# Patient Record
Sex: Male | Born: 1960 | Race: White | Hispanic: No | Marital: Single | Smoking: Current every day smoker
Health system: Southern US, Community
[De-identification: ages and names within clinical notes are randomized; demographics above are authoritative.]

## PROBLEM LIST (undated history)

## (undated) DIAGNOSIS — K859 Acute pancreatitis without necrosis or infection, unspecified: Secondary | ICD-10-CM

## (undated) DIAGNOSIS — C49A4 Gastrointestinal stromal tumor of large intestine: Secondary | ICD-10-CM

## (undated) DIAGNOSIS — Z0000 Encounter for general adult medical examination without abnormal findings: Secondary | ICD-10-CM

## (undated) DIAGNOSIS — F1010 Alcohol abuse, uncomplicated: Secondary | ICD-10-CM

## (undated) DIAGNOSIS — R911 Solitary pulmonary nodule: Secondary | ICD-10-CM

## (undated) DIAGNOSIS — D649 Anemia, unspecified: Secondary | ICD-10-CM

## (undated) DIAGNOSIS — Z720 Tobacco use: Secondary | ICD-10-CM

## (undated) DIAGNOSIS — K439 Ventral hernia without obstruction or gangrene: Secondary | ICD-10-CM

## (undated) DIAGNOSIS — F419 Anxiety disorder, unspecified: Secondary | ICD-10-CM

## (undated) DIAGNOSIS — K219 Gastro-esophageal reflux disease without esophagitis: Secondary | ICD-10-CM

## (undated) DIAGNOSIS — C49A0 Gastrointestinal stromal tumor, unspecified site: Secondary | ICD-10-CM

## (undated) DIAGNOSIS — Z8659 Personal history of other mental and behavioral disorders: Secondary | ICD-10-CM

## (undated) HISTORY — DX: Gastrointestinal stromal tumor of large intestine: C49.A4

## (undated) HISTORY — DX: Alcohol abuse, uncomplicated: F10.10

## (undated) HISTORY — DX: Solitary pulmonary nodule: R91.1

## (undated) HISTORY — DX: Ventral hernia without obstruction or gangrene: K43.9

## (undated) HISTORY — PX: PARTIAL COLECTOMY: SHX5273

## (undated) HISTORY — DX: Acute pancreatitis without necrosis or infection, unspecified: K85.90

## (undated) HISTORY — DX: Tobacco use: Z72.0

## (undated) HISTORY — PX: SMALL INTESTINE SURGERY: SHX150

## (undated) HISTORY — PX: HERNIA REPAIR: SHX51

## (undated) HISTORY — DX: Gastro-esophageal reflux disease without esophagitis: K21.9

## (undated) HISTORY — DX: Encounter for general adult medical examination without abnormal findings: Z00.00

## (undated) HISTORY — DX: Anemia, unspecified: D64.9

## (undated) HISTORY — DX: Gastrointestinal stromal tumor, unspecified site: C49.A0

## (undated) HISTORY — DX: Anxiety disorder, unspecified: F41.9

## (undated) HISTORY — DX: Personal history of other mental and behavioral disorders: Z86.59

---

## 2001-05-26 ENCOUNTER — Emergency Department (HOSPITAL_COMMUNITY): Admission: EM | Admit: 2001-05-26 | Discharge: 2001-05-27 | Payer: Self-pay | Admitting: *Deleted

## 2001-07-08 ENCOUNTER — Encounter: Payer: Self-pay | Admitting: Emergency Medicine

## 2001-07-08 ENCOUNTER — Encounter: Payer: Self-pay | Admitting: Neurosurgery

## 2001-07-08 ENCOUNTER — Inpatient Hospital Stay (HOSPITAL_COMMUNITY): Admission: AC | Admit: 2001-07-08 | Discharge: 2001-07-08 | Payer: Self-pay

## 2007-11-23 ENCOUNTER — Inpatient Hospital Stay (HOSPITAL_COMMUNITY): Admission: EM | Admit: 2007-11-23 | Discharge: 2007-12-05 | Payer: Self-pay | Admitting: Emergency Medicine

## 2007-11-24 ENCOUNTER — Ambulatory Visit: Payer: Self-pay | Admitting: Internal Medicine

## 2007-11-26 ENCOUNTER — Ambulatory Visit: Payer: Self-pay | Admitting: Gastroenterology

## 2007-11-30 ENCOUNTER — Ambulatory Visit: Payer: Self-pay | Admitting: Gastroenterology

## 2007-12-02 ENCOUNTER — Encounter (INDEPENDENT_AMBULATORY_CARE_PROVIDER_SITE_OTHER): Payer: Self-pay | Admitting: General Surgery

## 2008-10-19 ENCOUNTER — Emergency Department (HOSPITAL_COMMUNITY): Admission: EM | Admit: 2008-10-19 | Discharge: 2008-10-20 | Payer: Self-pay | Admitting: Emergency Medicine

## 2010-03-04 ENCOUNTER — Emergency Department (HOSPITAL_COMMUNITY)
Admission: EM | Admit: 2010-03-04 | Discharge: 2010-03-04 | Payer: Self-pay | Source: Home / Self Care | Admitting: Emergency Medicine

## 2010-03-07 LAB — URINALYSIS, ROUTINE W REFLEX MICROSCOPIC
Nitrite: NEGATIVE
Protein, ur: NEGATIVE mg/dL
Specific Gravity, Urine: <1.005 — ABNORMAL LOW (ref 1.005–1.030)
Urine Glucose, Fasting: NEGATIVE mg/dL
Urobilinogen, UA: 0.2 mg/dL (ref 0.0–1.0)
pH: 5.5 (ref 5.0–8.0)

## 2010-03-07 LAB — DIFFERENTIAL
Basophils Relative: 0 % (ref 0–1)
Eosinophils Relative: 1 % (ref 0–5)
Lymphocytes Relative: 30 % (ref 12–46)
Lymphs Abs: 2.5 10*3/uL (ref 0.7–4.0)
Monocytes Absolute: 0.5 10*3/uL (ref 0.1–1.0)
Monocytes Relative: 5 % (ref 3–12)
Neutrophils Relative %: 64 % (ref 43–77)

## 2010-03-07 LAB — CBC
HCT: 44.2 % (ref 39.0–52.0)
Hemoglobin: 16 g/dL (ref 13.0–17.0)
MCH: 34.4 pg — ABNORMAL HIGH (ref 26.0–34.0)
MCHC: 36.2 g/dL — ABNORMAL HIGH (ref 30.0–36.0)
RBC: 4.65 MIL/uL (ref 4.22–5.81)
WBC: 8.5 10*3/uL (ref 4.0–10.5)

## 2010-03-07 LAB — RAPID URINE DRUG SCREEN, HOSP PERFORMED
Opiates: NOT DETECTED
Tetrahydrocannabinol: NOT DETECTED

## 2010-03-07 LAB — BASIC METABOLIC PANEL
BUN: 5 mg/dL — ABNORMAL LOW (ref 6–23)
Chloride: 104 mEq/L (ref 96–112)
Creatinine, Ser: 0.98 mg/dL (ref 0.4–1.5)
Glucose, Bld: 86 mg/dL (ref 70–99)
Potassium: 3.7 mEq/L (ref 3.5–5.1)

## 2010-04-10 ENCOUNTER — Emergency Department (HOSPITAL_COMMUNITY)
Admission: EM | Admit: 2010-04-10 | Discharge: 2010-04-10 | Discharge status: Against Medical Advice | Payer: Self-pay | Attending: Emergency Medicine | Admitting: Emergency Medicine

## 2010-04-10 ENCOUNTER — Emergency Department (HOSPITAL_COMMUNITY): Payer: Self-pay

## 2010-04-10 DIAGNOSIS — M25569 Pain in unspecified knee: Secondary | ICD-10-CM | POA: Insufficient documentation

## 2010-04-10 DIAGNOSIS — F10229 Alcohol dependence with intoxication, unspecified: Secondary | ICD-10-CM | POA: Insufficient documentation

## 2010-04-10 LAB — CBC
HCT: 45.7 % (ref 39.0–52.0)
Hemoglobin: 15.6 g/dL (ref 13.0–17.0)
MCHC: 34.1 g/dL (ref 30.0–36.0)
WBC: 10.8 10*3/uL — ABNORMAL HIGH (ref 4.0–10.5)

## 2010-04-10 LAB — DIFFERENTIAL
Eosinophils Absolute: 0.1 10*3/uL (ref 0.0–0.7)
Eosinophils Relative: 1 % (ref 0–5)
Lymphs Abs: 3.2 10*3/uL (ref 0.7–4.0)
Monocytes Relative: 6 % (ref 3–12)
Neutro Abs: 6.7 10*3/uL (ref 1.7–7.7)
Neutrophils Relative %: 63 % (ref 43–77)

## 2010-04-10 LAB — COMPREHENSIVE METABOLIC PANEL
ALT: 15 U/L (ref 0–53)
AST: 28 U/L (ref 0–37)
Albumin: 3.7 g/dL (ref 3.5–5.2)
BUN: 5 mg/dL — ABNORMAL LOW (ref 6–23)
Chloride: 101 mEq/L (ref 96–112)
GFR calc non Af Amer: >60 mL/min (ref >60)
Glucose, Bld: 95 mg/dL (ref 70–99)
Total Bilirubin: 0.4 mg/dL (ref 0.3–1.2)
Total Protein: 7.7 g/dL (ref 6.0–8.3)

## 2010-04-10 LAB — GLUCOSE, CAPILLARY

## 2010-04-10 LAB — RAPID URINE DRUG SCREEN, HOSP PERFORMED
Amphetamines: NOT DETECTED
Barbiturates: NOT DETECTED
Cocaine: NOT DETECTED
Opiates: NOT DETECTED
Tetrahydrocannabinol: NOT DETECTED

## 2010-05-19 LAB — CBC
MCHC: 35.4 g/dL (ref 30.0–36.0)
MCV: 97 fL (ref 78.0–100.0)
Platelets: 95 10*3/uL — ABNORMAL LOW (ref 150–400)
RDW: 13.9 % (ref 11.5–15.5)
WBC: 5.4 10*3/uL (ref 4.0–10.5)

## 2010-05-19 LAB — COMPREHENSIVE METABOLIC PANEL
ALT: 44 U/L (ref 0–53)
AST: 79 U/L — ABNORMAL HIGH (ref 0–37)
Albumin: 3.9 g/dL (ref 3.5–5.2)
Alkaline Phosphatase: 68 U/L (ref 39–117)
BUN: 5 mg/dL — ABNORMAL LOW (ref 6–23)
Chloride: 104 mEq/L (ref 96–112)
Glucose, Bld: 96 mg/dL (ref 70–99)
Potassium: 3.9 mEq/L (ref 3.5–5.1)
Sodium: 141 mEq/L (ref 135–145)
Total Protein: 7.5 g/dL (ref 6.0–8.3)

## 2010-05-19 LAB — ETHANOL
Alcohol, Ethyl (B): 299 mg/dL — ABNORMAL HIGH (ref 0–10)
Alcohol, Ethyl (B): 97 mg/dL — ABNORMAL HIGH (ref 0–10)

## 2010-05-19 LAB — DIFFERENTIAL
Basophils Relative: 1 % (ref 0–1)
Monocytes Absolute: 0.5 10*3/uL (ref 0.1–1.0)
Monocytes Relative: 9 % (ref 3–12)
Neutro Abs: 2.5 10*3/uL (ref 1.7–7.7)

## 2010-05-19 LAB — RAPID URINE DRUG SCREEN, HOSP PERFORMED
Amphetamines: NOT DETECTED
Barbiturates: NOT DETECTED
Opiates: NOT DETECTED
Tetrahydrocannabinol: NOT DETECTED

## 2010-06-27 NOTE — Op Note (Signed)
[name], DESHLER                ACCOUNT NO.:  [id number]   MEDICAL RECORD NO.:  [id number]          PATIENT TYPE:  INP   LOCATION:  IC09                          FACILITY:  [facility]   PHYSICIAN:  Dalia Heading, M.D.  DATE OF BIRTH:  1960-04-29   DATE OF PROCEDURE:  12/02/2007  DATE OF DISCHARGE:                               OPERATIVE REPORT   PREOPERATIVE DIAGNOSIS:  Sigmoid colon diverticulitis with abscess.   POSTOPERATIVE DIAGNOSIS:  Sigmoid colon diverticulitis with abscess.   PROCEDURE:  Sigmoid colectomy.   SURGEON:  Dalia Heading, MD   ANESTHESIA:  General endotracheal.   INDICATIONS:  The patient is a 50 year old white male who presents with  an enlarging abscess in the sigmoid colon mesentery.  There is no free  fluid noted in the region.  It is suspected that the patient has had  diverticulitis with perforation as contained, though neoplasm has not  been ruled out.  As the abscess is enlarging, the patient now comes to  the operating room for a partial colectomy.  The risks and benefits of  the procedure including bleeding, infection, cardiopulmonary  difficulties, and the possibility of a colostomy were fully explained to  the patient, gave informed consent.   PROCEDURE NOTE:  The patient was placed in the low lithotomy position  after induction of general endotracheal anesthesia.  The abdomen and  perineum were prepped and draped using the usual sterile technique with  Betadine.  Surgical site confirmation was performed.   A lower midline incision was made from the umbilicus to the suprapubic  region.  The peritoneal cavity was entered into without difficulty.  The  small bowel was then displaced anteriorly.  The nasogastric tube was  noted to be in a appropriate position in the stomach.  The left colon  was mobilized along its peritoneal reflection.  This was done around the  sigmoid colon region down to the colorectal juncture.  The left ureter  was  identified and kept from the operative field.  The patient had a  small abscess cavity present, which contained only a few mL of purulent  fluid.  This was removed without difficulty.  Cultures could not be  attempted as the abscess cavity was so small in nature.  A GIA stapler  was placed across the colorectal juncture as well as the proximal  sigmoid colon.  The mesentery was divided using the LigaSure.  A single  suture was placed distally on the sigmoid colon for orientation purposes  and the specimen was sent to Pathology for further examination.  A side-  to-side anastomosis was then performed between the proximal sigmoid  colon and the colorectal juncture.  This was done using a GIA-75  stapler.  The colotomy was closed using a TA-60 stapler.  The staple  line was bolstered using 3-0 silk sutures.  Surrounding adipose tissue  was then used over the anastomotic line.  The pelvis was then copiously  irrigated with gentamicin and normal saline.  All surgical personnel  then changed their gloves.   The bowel was returned to the  abdominal cavity in orderly fashion.  The  fascia was reapproximated using a looped 0-PDS suture.  Subcutaneous  layer was irrigated with normal saline and the skin was closed using  staples.  Betadine ointment and dry sterile dressings were applied.   The case was a dirty case as a small abscess was found.  All tape and  needle counts were correct at the end of the procedure.  The patient was  extubated in the operating room and went back to recovery room awake and  stable condition.   COMPLICATIONS:  None.   SPECIMEN:  Sigmoid colon.  Blood loss less than 100 mL.   DRAINS:  Jackson-Pratt drain to left pelvic region.      Dalia Heading, M.D.  Electronically Signed     MAJ/MEDQ  D:  12/02/2007  T:  12/02/2007  Job:  [id number]   cc:   Kassie Mends, M.D.  [address]  [place] , [place] [address]

## 2010-06-27 NOTE — Group Therapy Note (Signed)
NAMEALEC, [name]                ACCOUNT NO.:  [id number]   MEDICAL RECORD NO.:  [id number]          PATIENT TYPE:  INP   LOCATION:  A304                          FACILITY:  [facility]   PHYSICIAN:  Dorris Singh, DO    DATE OF BIRTH:  1960-08-01   DATE OF PROCEDURE:  11/30/2007  DATE OF DISCHARGE:                                 PROGRESS NOTE   The patient is seen today still complaining of some abdominal pain and  then the fact that he is not happy with his current diet.  He is  scheduled for surgery on Wednesday.  Will continue to keep him  comfortable until that point in time.   PHYSICAL EXAMINATION:  VITAL SIGNS:  His vitals are as follows, blood  pressure 126/74, heart rate 58, respirations 12, temperature 98.3.  He  is 98% on room air.  GENERAL:  The patient is a 50 year old Caucasian male who is well-  developed, well-nourished and in no acute distress.  HEART:  Regular rate and rhythm.  LUNGS:  Clear to auscultation bilaterally.  ABDOMEN:  Positive left lower quadrant tenderness.  EXTREMITIES:  Positive pulses.   CURRENT LABS:  For this morning are as follows, hemoglobin 12.1,  hematocrit 35.4, white count 9.3, platelet count 333.  Chemistry -  sodium of 41, potassium 3.6, chloride 107, CO2 28, BUN 2, creatinine  0.87, glucose 105.   ASSESSMENT AND PLAN:  1. Diverticulitis with perforation.  The patient is scheduled to have      surgery on Wednesday.  2. Alcohol withdrawal.  Will continue with Ativan and hopefully this      will keep him from having any anxiety associated with it.  Will      continue to monitor him.      Dorris Singh, DO  Electronically Signed     CB/MEDQ  D:  11/30/2007  T:  11/30/2007  Job:  [id number]

## 2010-06-27 NOTE — Group Therapy Note (Signed)
NAMESHIHAB, STATES                ACCOUNT NO.:  [id number]   MEDICAL RECORD NO.:  [id number]          PATIENT TYPE:  INP   LOCATION:  A337                          FACILITY:  [facility]   PHYSICIAN:  Dorris Singh, DO    DATE OF BIRTH:  [birth year]   DATE OF PROCEDURE:  11/27/2007  DATE OF DISCHARGE:                                 PROGRESS NOTE   The patient is seen today.  Stating that he was having some problems  with his IV.  Talked to the nurse and she will change and move it.  He  is scheduled for testing through GI.  We will continue to follow.   CURRENT VITALS:  His current vitals are not recorded at this time.  GENERAL:  The patient is well-developed, well-nourished, in no acute  distress.  HEART:  Regular rate and rhythm.  LUNGS:  Clear to auscultation bilaterally.  ABDOMEN:  Improved with decreased tenderness in the left lower quadrant.  EXTREMITIES:  Positive pulses.  No ecchymosis, edema or cyanosis.   White count of 10.1, hemoglobin 13.0, hematocrit 37.8, platelet count  157.  Sodium is 138, potassium of 3.5, chloride 104, CO2 is 26, glucose  119, BUN 2 and creatinine 0.88.   ASSESSMENT/PLAN:  1. Abdominal pain secondary to diverticulitis and acute pancreatitis.      Gastroenterology is on the case.  They are ordering additional      testing for the patient in the morning.  There is some concern with      his CT of his pancreas.  2. ETOH abuse.  The patient currently is still on Librium protocol.      He is doing well without any problems.  Also we will continue to      monitor him and change therapy as necessary.      Dorris Singh, DO  Electronically Signed     CB/MEDQ  D:  11/27/2007  T:  11/27/2007  Job:  [id number]
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
## 2010-06-27 NOTE — Discharge Summary (Signed)
[name], [name]                ACCOUNT NO.:  [id number]   MEDICAL RECORD NO.:  [id number]          PATIENT TYPE:  INP   LOCATION:  A305                          FACILITY:  [facility]   PHYSICIAN:  Dalia Heading, M.D.  DATE OF BIRTH:  Mar 18, 1960   DATE OF ADMISSION:  11/23/2007  DATE OF DISCHARGE:  10/23/2009LH                               DISCHARGE SUMMARY   HOSPITAL COURSE SUMMARY:  The patient is a 50 year old white male who  presented with worsening left lower quadrant abdominal pain.  He was  noted on admission to have sigmoid diverticulitis with a small  pericolonic abscess.  He was admitted to hospital for intravenous  antibiotic therapy.  He was also admitted to hospital for control of his  alcoholism.  He was placed on DT prophylaxis.  Subsequently, a repeat CT  scan of the abdomen and pelvis revealed an enlarging abscess in the  sigmoid colon.  A surgery consultation was obtained, and it was felt  that the patient needed to undergo surgical treatment.  He underwent a  sigmoid colectomy on December 02, 2007.  He tolerated the procedure well.  His postoperative course has been for the most part unremarkable.  His  diet was advanced without difficulty.  His bowel function has returned.  He had a drain in which has since been removed.  The patient is being  discharged home on December 05, 2007, in good and improving condition.   DISCHARGE INSTRUCTIONS:  The patient is to follow up Dr. Franky Macho on  December 09, 2007.   DISCHARGE MEDICATIONS:  1. Percocet 1-2 tablets p.o. q.4 h., p.r.n. pain.  2. Ambien 10 mg p.o. nightly p.r.n. sleep.  3. Augmentin 875 mg p.o. b.i.d. x1 week.  4. Xanax 1 mg p.o. q.8 h., p.r.n. anxiety.   PRINCIPAL DIAGNOSES:  1. Perforated sigmoid diverticulitis.  2. Alcohol abuse.   PRINCIPAL PROCEDURE:  Sigmoid colectomy on December 02, 2007.      Dalia Heading, M.D.  Electronically Signed     MAJ/MEDQ  D:  12/05/2007  T:  12/05/2007  Job:   [id number]   cc:   Kassie Mends, M.D.  [address]  [place] , [place] [address]

## 2010-06-27 NOTE — Group Therapy Note (Signed)
NAME:  [name], [name]                ACCOUNT NO.:  [id number]   MEDICAL RECORD NO.:  [id number]          PATIENT TYPE:  INP   LOCATION:  A337                          FACILITY:  [facility]   PHYSICIAN:  Osvaldo Shipper, MD     DATE OF BIRTH:  1960/09/23   DATE OF PROCEDURE:  DATE OF DISCHARGE:                                 PROGRESS NOTE   SUBJECTIVE:  Patient is still complaining of 7/10 abdominal pain, as he  denies any nausea, vomiting, or diarrhea.  He is not passing any gas  from below.  He denies any other complaints.   OBJECTIVE:  VITAL SIGNS:  He is afebrile at 98.7.  Heart rate in the  60s.  Respiratory rate 20.  Saturations 95% on room air.  Blood pressure  119/72.  GENERAL:  This is a thin white male in no distress.  HEENT:  There is no pallor, no icterus.  Oral mucous membranes are  moist.  No oral lesions are noted.  LUNGS:  Clear to auscultation bilaterally anteriorly.  CARDIOVASCULAR:  S1 and S2 is normal.  Regular.  No murmurs appreciated.  No S3 or S4.  No rubs, no bruits.  ABDOMEN:  Soft.  There is tenderness in the lower quadrants bilaterally.  No rebound, guarding, or rigidity is present.  Bowel sounds are present.  No masses or organomegaly is appreciated.  MUSCULOSKELETAL:  Unremarkable.  NEUROLOGIC:  He does have some tremors, but he is alert and oriented x3.  No focal neurological deficits are present.   LABS:  His CBC shows a low platelet count of 101.  It is a drop from 141  two days ago.  White count is normal.  Hemoglobin is normal.  His  electrolytes are all normal.  His AST is elevated at 93.  ALT is 56.  Albumin is 2.9.  Amylase and lipase were normal yesterday.  Hepatitis B  and C are negative.   ASSESSMENT/PLAN:  1. Acute diverticulitis:  He is on Cipro and Flagyl, day 3.  He is      stable.  His pain is still pretty severe.  He is on morphine every      4 hours as needed.  He is slightly somnolent, so I do not want to      increase the dose anymore,  as it might cause severe respiratory and      neurological depression.  GI is following him.  They are      recommending a CT scan in one month's time and a colonoscopy in the      future.  2. Possible acute pancreatitis:  Pancreatic enzymes have normalized.      He is currently on this liquid diet, which will be continued for      now.  GI is following this patient.  3. Alcohol abuse with mild withdrawal symptoms.  He is on the librium      protocol, which will be continued.  He is on thiamine and      multivitamins.  4. Laceration, left forearm:  Bacitracin ointment will be  utilized, as      there was some yellowish exudate noted.  He is also getting daily      dressing changes.  5. Mild thrombocytopenia:  He is on enoxaparin.  We have to monitor      his platelet counts closely.  If they drop      down any further, consideration should be given to stopping the      Lovenox.  6. Elevated liver function tests, possibly from his alcohol      consumption.  This will also require followup.   This is only day #3 of his admission.  He still has to improve further  before discharge planning can be initiated.      Osvaldo Shipper, MD  Electronically Signed     GK/MEDQ  D:  11/25/2007  T:  11/25/2007  Job:  [id number]

## 2010-06-27 NOTE — Group Therapy Note (Signed)
NAME:  [name], [name]             ACCOUNT NO.:  [id number]   MEDICAL RECORD NO.:  [id number]         PATIENT TYPE:  PINP   LOCATION:  A305                          FACILITY:  [facility]   PHYSICIAN:  Dorris Singh, DO    DATE OF BIRTH:  [birth year]   DATE OF PROCEDURE:  DATE OF DISCHARGE:  12/05/2007                                 PROGRESS NOTE   Patient returning from surgery.  No complications at this point in time.   PHYSICAL EXAMINATION:  Current vitals are as follows:  Heart rate is 77,  blood pressure 114/69.  GENERAL:  Patient is well-developed and well-nourished in no acute  distress.  HEART:  Regular rate and rhythm.  LUNGS:  Clear to auscultation bilaterally.  ABDOMEN:  Soft with appropriate tenderness prior to surgery.  EXTREMITIES:  Positive pulses.   Pending labs.  We will go ahead and check his labs today.   ASSESSMENT:  1. Sigmoid colon abscess, status post surgery today.  2. Alcohol abuse.   PLAN:  Monitor the patient in the ICU.  Continue to follow him with  blood work.  If surgery clears him, we will discharge him to home.  For  the alcohol abuse, patient is also interested in rehab.  We will  facilitate some resources for him to go to once he is discharged from  here.  We will continue to monitor and change therapies.      Dorris Singh, DO  Electronically Signed     CB/MEDQ  D:  12/02/2007  T:  12/02/2007  Job:  [id number]

## 2010-06-27 NOTE — Group Therapy Note (Signed)
NAMERANSOME, HELWIG                ACCOUNT NO.:  [id number]   MEDICAL RECORD NO.:  [id number]          PATIENT TYPE:  INP   LOCATION:  A337                          FACILITY:  [facility]   PHYSICIAN:  Dorris Singh, DO    DATE OF BIRTH:  [birth year]   DATE OF PROCEDURE:  11/26/2007  DATE OF DISCHARGE:                                 PROGRESS NOTE   I saw the patient in the room today walking around.  States that he has  showered and is feeling better.  He is complaining of abdominal pain.  I  explained to him that as long as he is complaining of abdominal pain we  cannot advance his diet.  He was also complaining about having eaten  Jello for several days.  So, the patient stated understanding of exactly  why he cannot eat if he has abdominal pain and why we will not advance  his diet if he is continuing to ask for pain medications for abdominal  pain.   PHYSICAL EXAMINATION:  Temperature 98.7, pulse 74, respirations 18,  blood pressure 149/90.  GENERAL:  The patient is well developed, well nourished, in no distress.  HEART:  Regular rate and rhythm.  LUNGS:  Clear to auscultation bilaterally.  ABDOMEN:  Some tenderness on deep palpation, kind of generalized with  some suprapubic tenderness.  EXTREMITIES:  Positive pulses.  No edema, ecchymosis or cyanosis.   His labs for today are as follows:  White count 9.8, hemoglobin 13.8,  hematocrit 39.8, platelet count of 124.  Sodium is 138, potassium is  3.4, chloride is 106, CO2 is 25, glucose 102 and BUN 2, creatinine 0.79.   ASSESSMENT/PLAN:  1. Acute diverticulitis.  The patient is going to continue on      antibiotic therapy, and GI is following his case.  2. Acute pancreatitis.  Will continue him on liquid diet and advance      as tolerated.  Also, GI is following him.  3. Alcohol abuse.  He is currently on a Librium protocol.  Will      continue to monitor him.  4. Laceration, left forearm.  Bacitracin ointment is in place on  admitted and placed on this as well, and he is getting daily      dressings.   Will continue to monitor his mild thrombocytopenia as well as his liver  enzyme function tests, and we are also waiting for the recommendations  GI has for possible discharge in the future.      Dorris Singh, DO  Electronically Signed     CB/MEDQ  D:  11/26/2007  T:  11/26/2007  Job:  [id number]

## 2010-06-27 NOTE — H&P (Signed)
[name], [name]                ACCOUNT NO.:  [id number]   MEDICAL RECORD NO.:  [id number]          PATIENT TYPE:  INP   LOCATION:  IC02                          FACILITY:  [facility]   PHYSICIAN:  [name], M.D.DATE OF BIRTH:  Jun 28, 1960   DATE OF ADMISSION:  11/23/2007  DATE OF DISCHARGE:  LH                              HISTORY & PHYSICAL   ADMISSION DIAGNOSES:  1. Acute abdominal pain.  2. Acute pancreatitis.  3. Acute diverticulitis  4. Acute alcohol intoxication/chronic abuse, blood alcohol level      greater than 0.2.   CHIEF COMPLAINT:  Abdominal pain of a few days duration.   HISTORY OF PRESENT ILLNESS:  [name] is a 50 year old Caucasian male  who presented to the emergency room with complaints of epigastric pain.   He reports that the pain is about 8/10, mostly in the epigastric area  and also in the suprapubic area.  There was no radiation.  He describes  this as a pressure-like pain, moderate to severe, it is not aggravated  by any factors.  There is also no relieving factors.  Patient had some  nausea but denies any vomiting.  He has had no fevers or chills.  No  headaches, no dizziness or lightheadedness.  The patient drinks a fair  amount of beer, he drinks about 24 cans of Budweiser every day.  Initial  evaluation in the emergency room revealed evidence of acute pancreatitis  with mildly elevated lipase level.  A CT scan of the abdomen and pelvis  also confirmed pancreatitis but patient also had diverticulitis.  The  patient was started on Cipro and Flagyl in the emergency room.  Currently patient feels slightly better and says pain is now reduced.   REVIEW OF SYSTEMS:  A 10-point review of systems is otherwise negative  except as mentioned in history of present illness.   PAST MEDICAL HISTORY:  1. Chronic alcohol abuse.  2. Polysubstance abuse.  3. 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   MEDICATIONS:  None.   ALLERGIES:   NO KNOWN DRUG ALLERGIES.   FAMILY HISTORY:  Noncontributory.   SOCIAL HISTORY:  The patient is divorced, has 1 child.  He smokes about  3 packs of cigarettes a day.  He drinks about 24 cans of beer a day.  He  denies any illicit drug use.   He is currently working as a Administrator, sports and he says that  business has been very slow and he has been very depressed.   PHYSICAL EXAM:  The patient was conscious, alert, comfortable, not in  acute distress, well oriented in time, place and person.  He appeared to  have some tremors at rest exacerbated by action.  VITAL SIGNS:  His blood pressure on arrival in the emergency room was  143/93 with a pulse of 94, respirations 18, temperature 97.5, oxygen  saturation 95% on room air.  HEENT:  Normocephalic/atraumatic, oral mucosa was moist with no  exudates.  NECK:  Supple, no JVD, or lymphadenopathy.  LUNGS:  Clear clinically with good air entry bilaterally.  HEART:  S1-S2 regular, no S3, S4, gallops or rubs.  ABDOMEN:  Soft, not tender, not distended, bowel sounds were positive,  no masses palpable.  EXTREMITIES:  No pitting pedal edema, no calf induration or tenderness  was noted.  CNS:  Grossly intact with no focal neurological deficits.   LABORATORY/DIAGNOSTIC DATA:  White blood cell count 10.9, hemoglobin of  13.6, hematocrit 39.5, platelet count was 141 with no left shift, sodium  134, potassium 3.3, chloride of 100, CO2 was 27, glucose 102, BUN of 3,  creatinine was 0.81, AST was 56, ALT of 32, calcium is 8.5, lipase 121.  Urinalysis was negative.  Alcohol level was 274.   CT scan of the abdomen and pelvis revealed moderate sigmoid  diverticulitis with pericolonic fluid and gas consistent with a small  diverticular abscess.   There are no acute abdominal processes noted..  The patient has some  fatty infiltration of the liver.   ASSESSMENT:  [name] is a 50 year old Caucasian male, presented to  emergency room with  acute abdominal pain.  Findings consistent with  diverticulitis and pancreatitis as mentioned above.   PLAN:  1. Admit patient to the medical floor.  2. We will treat him clinically for now.  3. Start him on IV fluids, normal saline.  4. Will continue on intravenous ciprofloxacin and metronidazole.  5. DVT prophylaxis with Lovenox, GI prophylaxis with Protonix.  6. Will control pain with morphine and Tylenol p.r.n.  7. Counseling given on smoking cessation and alcohol abuse.  8. We will start patient on a Librium withdrawal protocol.  9. Will put patient on folic acid, thiamine and multivitamin and IV      fluids.  10.Request GI consult and surgical consult to be aware of the patient      although I doubt he is a surgical candidate at this time.   Will monitor patient very closely for alcohol withdrawal.  I have  discussed the above plan with the patient and he verbalized full  understanding.      [name], M.D.  Electronically Signed     AM/MEDQ  D:  11/24/2007  T:  11/24/2007  Job:  [id number]
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
## 2010-11-13 LAB — DIFFERENTIAL
Basophils Absolute: 0
Basophils Absolute: 0
Basophils Absolute: 0
Basophils Absolute: 0
Basophils Absolute: 0.1
Basophils Relative: 0
Basophils Relative: 0
Basophils Relative: 1
Eosinophils Absolute: 0.1
Eosinophils Absolute: 0.1
Eosinophils Absolute: 0.2
Eosinophils Absolute: 0.2
Eosinophils Absolute: 0.2
Eosinophils Absolute: 0.3
Eosinophils Relative: 1
Eosinophils Relative: 1
Eosinophils Relative: 1
Eosinophils Relative: 2
Eosinophils Relative: 2
Eosinophils Relative: 2
Eosinophils Relative: 3
Lymphocytes Relative: 13
Lymphocytes Relative: 13
Lymphocytes Relative: 18
Lymphocytes Relative: 20
Lymphs Abs: 1.1
Lymphs Abs: 1.1
Lymphs Abs: 1.4
Lymphs Abs: 1.6
Lymphs Abs: 1.6
Lymphs Abs: 1.8
Monocytes Absolute: 0.9
Monocytes Absolute: 1
Monocytes Absolute: 1.2 — ABNORMAL HIGH
Monocytes Absolute: 1.3 — ABNORMAL HIGH
Monocytes Absolute: 1.4 — ABNORMAL HIGH
Monocytes Absolute: 1.4 — ABNORMAL HIGH
Monocytes Relative: 10
Monocytes Relative: 10
Monocytes Relative: 12
Monocytes Relative: 13 — ABNORMAL HIGH
Monocytes Relative: 15 — ABNORMAL HIGH
Neutro Abs: 5.3
Neutro Abs: 5.9
Neutro Abs: 7
Neutro Abs: 7.6
Neutrophils Relative %: 64
Neutrophils Relative %: 69
Neutrophils Relative %: 72
Neutrophils Relative %: 74

## 2010-11-13 LAB — URINALYSIS, ROUTINE W REFLEX MICROSCOPIC
Bilirubin Urine: NEGATIVE
Glucose, UA: NEGATIVE
Ketones, ur: NEGATIVE
Nitrite: NEGATIVE
pH: 6.5

## 2010-11-13 LAB — HIV ANTIBODY (ROUTINE TESTING W REFLEX): HIV: NONREACTIVE

## 2010-11-13 LAB — BASIC METABOLIC PANEL
BUN: 1 — ABNORMAL LOW
BUN: 2 — ABNORMAL LOW
BUN: 3 — ABNORMAL LOW
CO2: 26
CO2: 29
CO2: 32
Calcium: 8.4
Chloride: 102
Chloride: 104
Chloride: 105
Chloride: 105
Chloride: 108
Creatinine, Ser: 0.8
GFR calc Af Amer: >60
GFR calc Af Amer: >60
GFR calc Af Amer: >60
GFR calc non Af Amer: >60
Glucose, Bld: 102 — ABNORMAL HIGH
Glucose, Bld: 103 — ABNORMAL HIGH
Glucose, Bld: 113 — ABNORMAL HIGH
Potassium: 3.4 — ABNORMAL LOW
Potassium: 3.5
Potassium: 3.6
Potassium: 4.1
Potassium: 4.2
Sodium: 138
Sodium: 138
Sodium: 139
Sodium: 141
Sodium: 142

## 2010-11-13 LAB — COMPREHENSIVE METABOLIC PANEL
ALT: 56 — ABNORMAL HIGH
AST: 45 — ABNORMAL HIGH
Albumin: 2.7 — ABNORMAL LOW
Albumin: 3 — ABNORMAL LOW
Alkaline Phosphatase: 91
BUN: 2 — ABNORMAL LOW
BUN: 3 — ABNORMAL LOW
BUN: 3 — ABNORMAL LOW
CO2: 27
CO2: 28
Calcium: 8.5
Chloride: 100
Chloride: 104
Chloride: 105
Creatinine, Ser: 0.73
Creatinine, Ser: 0.81
GFR calc Af Amer: >60
GFR calc non Af Amer: >60
Glucose, Bld: 102 — ABNORMAL HIGH
Glucose, Bld: 103 — ABNORMAL HIGH
Glucose, Bld: 105 — ABNORMAL HIGH
Potassium: 3.6
Potassium: 3.8
Sodium: 139
Sodium: 141
Total Bilirubin: 0.3
Total Bilirubin: 0.5
Total Bilirubin: 0.9
Total Protein: 5.9 — ABNORMAL LOW
Total Protein: 6.3

## 2010-11-13 LAB — CBC
HCT: 33.3 — ABNORMAL LOW
HCT: 34.4 — ABNORMAL LOW
HCT: 35.4 — ABNORMAL LOW
HCT: 37.8 — ABNORMAL LOW
HCT: 39.5
HCT: 39.8
HCT: 40
Hemoglobin: 11.5 — ABNORMAL LOW
Hemoglobin: 11.9 — ABNORMAL LOW
Hemoglobin: 12.2 — ABNORMAL LOW
Hemoglobin: 13
Hemoglobin: 13.8
MCHC: 34.3
MCHC: 34.5
MCHC: 34.6
MCV: 97.3
MCV: 97.5
MCV: 97.6
MCV: 97.7
MCV: 97.8
MCV: 97.8
MCV: 97.8
MCV: 98.3
Platelets: 119 — ABNORMAL LOW
Platelets: 124 — ABNORMAL LOW
Platelets: 263
Platelets: 335
Platelets: 532 — ABNORMAL HIGH
RBC: 3.45 — ABNORMAL LOW
RBC: 3.67 — ABNORMAL LOW
RBC: 3.88 — ABNORMAL LOW
RBC: 4.05 — ABNORMAL LOW
RBC: 4.09 — ABNORMAL LOW
RDW: 12.7
RDW: 12.8
RDW: 13.1
RDW: 13.3
RDW: 13.6
WBC: 10.1
WBC: 10.3
WBC: 10.4
WBC: 10.9 — ABNORMAL HIGH
WBC: 7.4
WBC: 8.4
WBC: 9.1
WBC: 9.3

## 2010-11-13 LAB — PROTIME-INR
INR: 1.1
Prothrombin Time: 14.6

## 2010-11-13 LAB — HEPATITIS C ANTIBODY: HCV Ab: NEGATIVE

## 2010-11-13 LAB — APTT: aPTT: 35

## 2010-11-13 LAB — LIPASE, BLOOD: Lipase: 121 — ABNORMAL HIGH

## 2010-11-13 LAB — CEA: CEA: 5

## 2010-11-13 LAB — CROSSMATCH
ABO/RH(D): A POS
Antibody Screen: NEGATIVE

## 2010-11-13 LAB — PHOSPHORUS: Phosphorus: 4.7 — ABNORMAL HIGH

## 2010-11-13 LAB — AMYLASE: Amylase: 69

## 2010-11-13 LAB — ABO/RH: ABO/RH(D): A POS

## 2010-11-13 LAB — MAGNESIUM: Magnesium: 1.9

## 2011-04-30 IMAGING — CR DG KNEE 1-2V PORT*R*
2 series · 2 of 2 positions shown · non-contrast
Comparison: None

CLINICAL DATA: Right knee pain, fall, intoxicated

PORTABLE RIGHT KNEE - 1-2 VIEW

[view not recorded (1 of 2)]
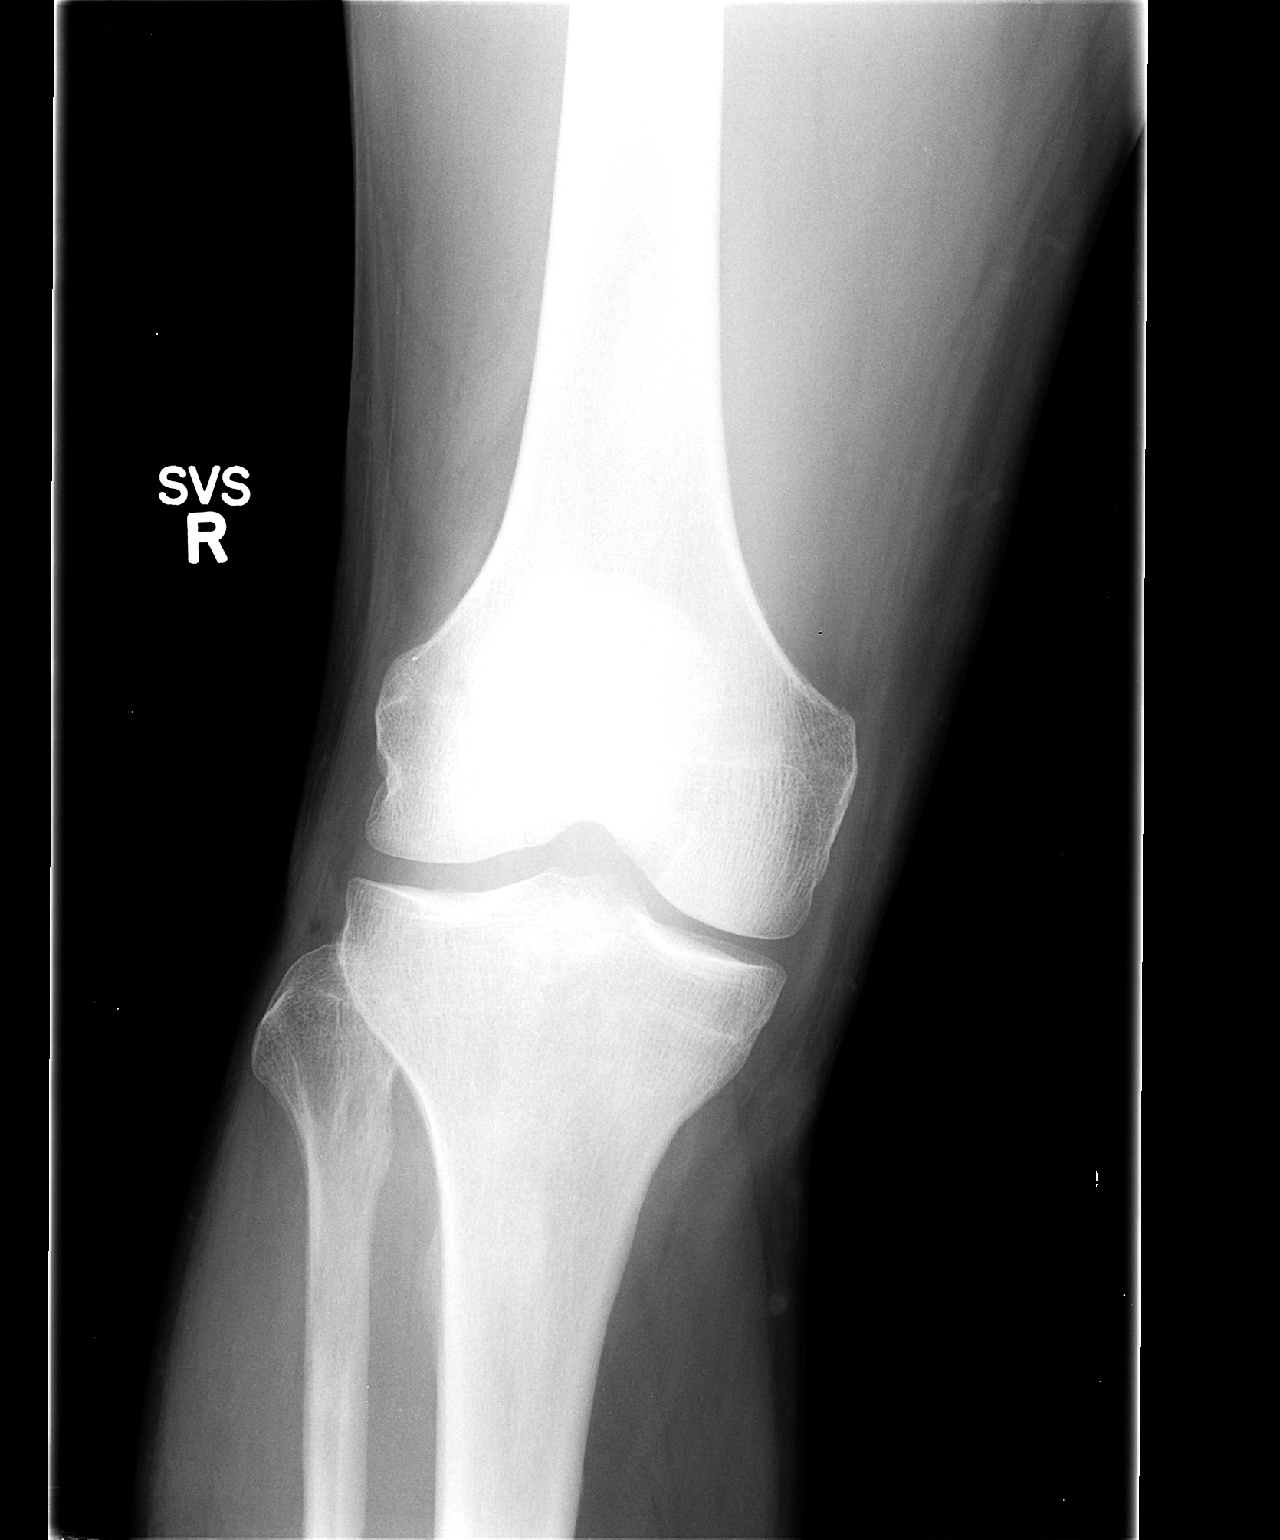

[view not recorded (2 of 2)]
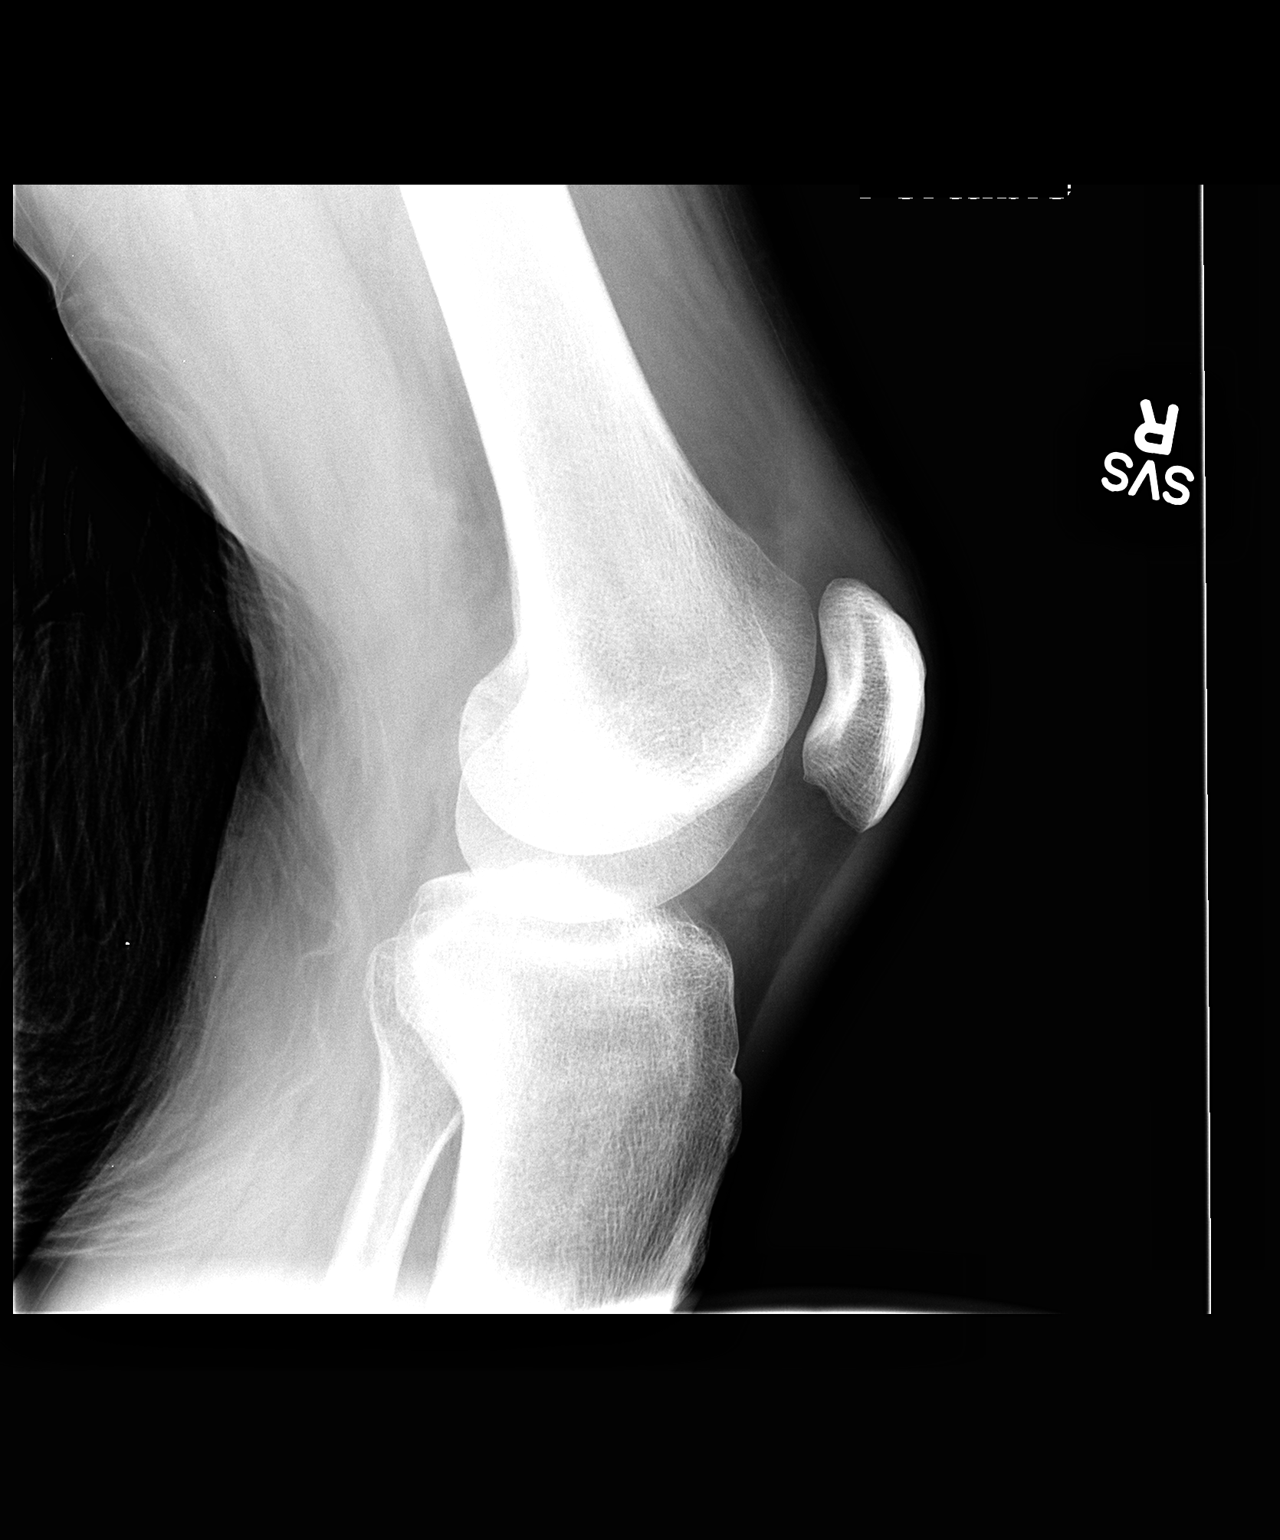

[2 of 2 positions shown; findings below may reference images not displayed]

FINDINGS: Osseous mineralization normal.
Joint spaces preserved.
Questionable small knee joint effusion.
No acute fracture, dislocation, or bone destruction.
IMPRESSION: No acute osseous findings.
Questionable small knee joint effusion.

## 2012-09-01 DIAGNOSIS — R109 Unspecified abdominal pain: Secondary | ICD-10-CM

## 2012-09-04 DIAGNOSIS — G8929 Other chronic pain: Secondary | ICD-10-CM | POA: Insufficient documentation

## 2012-09-04 DIAGNOSIS — K439 Ventral hernia without obstruction or gangrene: Secondary | ICD-10-CM | POA: Insufficient documentation

## 2012-09-04 DIAGNOSIS — F419 Anxiety disorder, unspecified: Secondary | ICD-10-CM

## 2012-09-04 DIAGNOSIS — F1010 Alcohol abuse, uncomplicated: Secondary | ICD-10-CM | POA: Insufficient documentation

## 2012-09-04 HISTORY — DX: Anxiety disorder, unspecified: F41.9

## 2014-08-17 ENCOUNTER — Encounter (HOSPITAL_COMMUNITY): Payer: Medicaid Other | Attending: Hematology & Oncology | Admitting: Hematology & Oncology

## 2014-08-17 ENCOUNTER — Other Ambulatory Visit (HOSPITAL_COMMUNITY): Payer: Self-pay | Admitting: Hematology & Oncology

## 2014-08-17 ENCOUNTER — Encounter (HOSPITAL_COMMUNITY): Payer: Self-pay | Admitting: Hematology & Oncology

## 2014-08-17 VITALS — BP 132/85 | HR 81 | Temp 98.1°F | Resp 18 | Ht 73.0 in | Wt 188.8 lb

## 2014-08-17 DIAGNOSIS — R52 Pain, unspecified: Secondary | ICD-10-CM

## 2014-08-17 DIAGNOSIS — F319 Bipolar disorder, unspecified: Secondary | ICD-10-CM | POA: Diagnosis not present

## 2014-08-17 DIAGNOSIS — Z9049 Acquired absence of other specified parts of digestive tract: Secondary | ICD-10-CM | POA: Insufficient documentation

## 2014-08-17 DIAGNOSIS — Z9119 Patient's noncompliance with other medical treatment and regimen: Secondary | ICD-10-CM | POA: Diagnosis not present

## 2014-08-17 DIAGNOSIS — C494 Malignant neoplasm of connective and soft tissue of abdomen: Secondary | ICD-10-CM | POA: Diagnosis present

## 2014-08-17 DIAGNOSIS — C49A0 Gastrointestinal stromal tumor, unspecified site: Secondary | ICD-10-CM

## 2014-08-17 DIAGNOSIS — K439 Ventral hernia without obstruction or gangrene: Secondary | ICD-10-CM | POA: Diagnosis not present

## 2014-08-17 DIAGNOSIS — F17210 Nicotine dependence, cigarettes, uncomplicated: Secondary | ICD-10-CM | POA: Diagnosis not present

## 2014-08-17 DIAGNOSIS — F1410 Cocaine abuse, uncomplicated: Secondary | ICD-10-CM | POA: Insufficient documentation

## 2014-08-17 DIAGNOSIS — G8929 Other chronic pain: Secondary | ICD-10-CM | POA: Insufficient documentation

## 2014-08-17 DIAGNOSIS — K219 Gastro-esophageal reflux disease without esophagitis: Secondary | ICD-10-CM | POA: Insufficient documentation

## 2014-08-17 DIAGNOSIS — R109 Unspecified abdominal pain: Secondary | ICD-10-CM | POA: Diagnosis not present

## 2014-08-17 DIAGNOSIS — Z79899 Other long term (current) drug therapy: Secondary | ICD-10-CM | POA: Insufficient documentation

## 2014-08-17 DIAGNOSIS — Z7982 Long term (current) use of aspirin: Secondary | ICD-10-CM | POA: Insufficient documentation

## 2014-08-17 DIAGNOSIS — F1010 Alcohol abuse, uncomplicated: Secondary | ICD-10-CM | POA: Diagnosis not present

## 2014-08-17 LAB — RAPID URINE DRUG SCREEN, HOSP PERFORMED
AMPHETAMINES: NOT DETECTED
BENZODIAZEPINES: NOT DETECTED
Barbiturates: NOT DETECTED
COCAINE: NOT DETECTED
Opiates: NOT DETECTED
Tetrahydrocannabinol: NOT DETECTED

## 2014-08-17 LAB — CBC WITH DIFFERENTIAL/PLATELET
BASOS PCT: 1 % (ref 0–1)
Basophils Absolute: 0.1 10*3/uL (ref 0.0–0.1)
Eosinophils Absolute: 0.1 10*3/uL (ref 0.0–0.7)
Eosinophils Relative: 1 % (ref 0–5)
HEMATOCRIT: 39.9 % (ref 39.0–52.0)
HEMOGLOBIN: 14 g/dL (ref 13.0–17.0)
LYMPHS ABS: 2.7 10*3/uL (ref 0.7–4.0)
LYMPHS PCT: 27 % (ref 12–46)
MCH: 35.2 pg — ABNORMAL HIGH (ref 26.0–34.0)
MCHC: 35.1 g/dL (ref 30.0–36.0)
MCV: 100.3 fL — ABNORMAL HIGH (ref 78.0–100.0)
MONO ABS: 1.1 10*3/uL — AB (ref 0.1–1.0)
MONOS PCT: 11 % (ref 3–12)
NEUTROS ABS: 6.1 10*3/uL (ref 1.7–7.7)
Neutrophils Relative %: 60 % (ref 43–77)
Platelets: 200 10*3/uL (ref 150–400)
RBC: 3.98 MIL/uL — AB (ref 4.22–5.81)
RDW: 13 % (ref 11.5–15.5)
WBC: 10.1 10*3/uL (ref 4.0–10.5)

## 2014-08-17 LAB — COMPREHENSIVE METABOLIC PANEL
ALBUMIN: 4.2 g/dL (ref 3.5–5.0)
ALK PHOS: 93 U/L (ref 38–126)
ALT: 41 U/L (ref 17–63)
ANION GAP: 16 — AB (ref 5–15)
AST: 83 U/L — AB (ref 15–41)
BILIRUBIN TOTAL: 0.5 mg/dL (ref 0.3–1.2)
BUN: 6 mg/dL (ref 6–20)
CHLORIDE: 96 mmol/L — AB (ref 101–111)
CO2: 19 mmol/L — ABNORMAL LOW (ref 22–32)
Calcium: 8 mg/dL — ABNORMAL LOW (ref 8.9–10.3)
Creatinine, Ser: 0.9 mg/dL (ref 0.61–1.24)
Glucose, Bld: 92 mg/dL (ref 65–99)
POTASSIUM: 3.8 mmol/L (ref 3.5–5.1)
SODIUM: 131 mmol/L — AB (ref 135–145)
TOTAL PROTEIN: 7.6 g/dL (ref 6.5–8.1)

## 2014-08-17 LAB — IRON AND TIBC
Iron: 112 ug/dL (ref 45–182)
Saturation Ratios: 28 % (ref 17.9–39.5)
TIBC: 402 ug/dL (ref 250–450)
UIBC: 290 ug/dL

## 2014-08-17 LAB — FERRITIN: FERRITIN: 225 ng/mL (ref 24–336)

## 2014-08-17 MED ORDER — MORPHINE SULFATE ER 30 MG PO TBCR: 30.0000 mg | EXTENDED_RELEASE_TABLET | Freq: Two times a day (BID) | ORAL | Status: DC

## 2014-08-17 MED ORDER — IMATINIB MESYLATE 400 MG PO TABS: 400.0000 mg | ORAL_TABLET | Freq: Every day | ORAL | Status: DC

## 2014-08-17 MED ORDER — OXYCODONE HCL 10 MG PO TABS: 10.0000 mg | ORAL_TABLET | ORAL | Status: DC | PRN

## 2014-08-17 MED ORDER — OMEPRAZOLE 40 MG PO CPDR: 40.0000 mg | DELAYED_RELEASE_CAPSULE | Freq: Every day | ORAL | Status: DC

## 2014-08-17 MED ORDER — BUSPIRONE HCL 5 MG PO TABS: 5.0000 mg | ORAL_TABLET | Freq: Three times a day (TID) | ORAL | Status: DC

## 2014-08-17 MED ORDER — MIRTAZAPINE 45 MG PO TABS: 45.0000 mg | ORAL_TABLET | Freq: Every evening | ORAL | Status: DC | PRN
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
## 2014-08-17 NOTE — Patient Instructions (Signed)
[place] at [facility] Discharge Instructions  RECOMMENDATIONS MADE BY THE CONSULTANT AND ANY TEST RESULTS WILL BE SENT TO YOUR REFERRING PHYSICIAN.  Exam and discussion by Dr. Whitney Muse Will prescribe the following medications.   Morphine 30 mg every 12 hours Oxycodone 1 tablet every 4 hours as needed for pain Remeron 45 mg at bedtime Buspar 5 mg three times daily Prilosec 40 mg daily.  Will also do random drug screens when you come in. CT scan of your chest, abdomen and pelvis in the next 2 weeks Office visit in 3 weeks.   Thank you for choosing [place] at [facility] to provide your oncology and hematology care.  To afford each patient quality time with our provider, please arrive at least 15 minutes before your scheduled appointment time.    You need to re-schedule your appointment should you arrive 10 or more minutes late.  We strive to give you quality time with our providers, and arriving late affects you and other patients whose appointments are after yours.  Also, if you no show three or more times for appointments you may be dismissed from the clinic at the providers discretion.     Again, thank you for choosing [facility].  Our hope is that these requests will decrease the amount of time that you wait before being seen by our physicians.       _____________________________________________________________  Should you have questions after your visit to [facility], please contact our office at [phone] between the hours of 8:30 a.m. and 4:30 p.m.  Voicemails left after 4:30 p.m. will not be returned until the following business day.  For prescription refill requests, have your pharmacy contact our office.

## 2014-08-25 ENCOUNTER — Encounter (HOSPITAL_COMMUNITY): Payer: Self-pay

## 2014-08-25 ENCOUNTER — Ambulatory Visit (HOSPITAL_COMMUNITY)
Admission: RE | Admit: 2014-08-25 | Discharge: 2014-08-25 | Disposition: A | Discharge status: Home / Self Care | Payer: Medicaid Other | Source: Ambulatory Visit | Attending: Hematology & Oncology | Admitting: Hematology & Oncology

## 2014-08-25 DIAGNOSIS — K219 Gastro-esophageal reflux disease without esophagitis: Secondary | ICD-10-CM | POA: Insufficient documentation

## 2014-08-25 DIAGNOSIS — F17200 Nicotine dependence, unspecified, uncomplicated: Secondary | ICD-10-CM | POA: Insufficient documentation

## 2014-08-25 DIAGNOSIS — C49A0 Gastrointestinal stromal tumor, unspecified site: Secondary | ICD-10-CM

## 2014-08-25 DIAGNOSIS — F1010 Alcohol abuse, uncomplicated: Secondary | ICD-10-CM | POA: Insufficient documentation

## 2014-08-25 DIAGNOSIS — C494 Malignant neoplasm of connective and soft tissue of abdomen: Secondary | ICD-10-CM | POA: Diagnosis present

## 2014-08-25 DIAGNOSIS — R911 Solitary pulmonary nodule: Secondary | ICD-10-CM | POA: Insufficient documentation

## 2014-08-25 DIAGNOSIS — I2510 Atherosclerotic heart disease of native coronary artery without angina pectoris: Secondary | ICD-10-CM | POA: Diagnosis not present

## 2014-08-25 DIAGNOSIS — J439 Emphysema, unspecified: Secondary | ICD-10-CM | POA: Insufficient documentation

## 2014-08-25 DIAGNOSIS — C786 Secondary malignant neoplasm of retroperitoneum and peritoneum: Secondary | ICD-10-CM | POA: Diagnosis not present

## 2014-08-25 MED ORDER — IOHEXOL 300 MG/ML  SOLN
100.0000 mL | Freq: Once | INTRAMUSCULAR | Status: AC | PRN
  Administered 2014-08-25: 100 mL via INTRAVENOUS

## 2014-08-27 ENCOUNTER — Other Ambulatory Visit (HOSPITAL_COMMUNITY): Payer: Self-pay | Admitting: Oncology

## 2014-09-07 ENCOUNTER — Encounter (HOSPITAL_COMMUNITY): Payer: Self-pay | Admitting: Hematology & Oncology

## 2014-09-07 ENCOUNTER — Encounter (HOSPITAL_BASED_OUTPATIENT_CLINIC_OR_DEPARTMENT_OTHER): Payer: Medicaid Other | Admitting: Hematology & Oncology

## 2014-09-07 VITALS — BP 122/69 | HR 94 | Temp 98.6°F | Resp 20 | Wt 184.4 lb

## 2014-09-07 DIAGNOSIS — F1021 Alcohol dependence, in remission: Secondary | ICD-10-CM | POA: Diagnosis not present

## 2014-09-07 DIAGNOSIS — C499 Malignant neoplasm of connective and soft tissue, unspecified: Secondary | ICD-10-CM | POA: Diagnosis present

## 2014-09-07 DIAGNOSIS — Z87898 Personal history of other specified conditions: Secondary | ICD-10-CM | POA: Diagnosis not present

## 2014-09-07 DIAGNOSIS — C49A0 Gastrointestinal stromal tumor, unspecified site: Secondary | ICD-10-CM

## 2014-09-07 DIAGNOSIS — Z809 Family history of malignant neoplasm, unspecified: Secondary | ICD-10-CM | POA: Diagnosis not present

## 2014-09-07 DIAGNOSIS — G893 Neoplasm related pain (acute) (chronic): Secondary | ICD-10-CM

## 2014-09-07 DIAGNOSIS — Z720 Tobacco use: Secondary | ICD-10-CM | POA: Diagnosis not present

## 2014-09-07 MED ORDER — MORPHINE SULFATE ER 60 MG PO TBCR: 60.0000 mg | EXTENDED_RELEASE_TABLET | Freq: Two times a day (BID) | ORAL | Status: DC

## 2014-09-07 MED ORDER — OXYCODONE HCL 10 MG PO TABS: 10.0000 mg | ORAL_TABLET | ORAL | Status: DC | PRN
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
## 2014-09-07 NOTE — Patient Instructions (Addendum)
[place] at [facility] Discharge Instructions  RECOMMENDATIONS MADE BY THE CONSULTANT AND ANY TEST RESULTS WILL BE SENT TO YOUR REFERRING PHYSICIAN.  Exam and discussion by Dr. Whitney Muse Your recent scan shows improvement so continue taking your Gleevec every day. Will adjust your pain medications by increasing your long acting Morphine to decrease the number of Oxycodone that you are taking. Your iron levels were good so you don't need any iron at the present  Follow-up in 1 month with labs and office visit.  Thank you for choosing [place] at [facility] to provide your oncology and hematology care.  To afford each patient quality time with our provider, please arrive at least 15 minutes before your scheduled appointment time.    You need to re-schedule your appointment should you arrive 10 or more minutes late.  We strive to give you quality time with our providers, and arriving late affects you and other patients whose appointments are after yours.  Also, if you no show three or more times for appointments you may be dismissed from the clinic at the providers discretion.     Again, thank you for choosing [facility].  Our hope is that these requests will decrease the amount of time that you wait before being seen by our physicians.       _____________________________________________________________  Should you have questions after your visit to [facility], please contact our office at [phone] between the hours of 8:30 a.m. and 4:30 p.m.  Voicemails left after 4:30 p.m. will not be returned until the following business day.  For prescription refill requests, have your pharmacy contact our office.

## 2014-09-23 ENCOUNTER — Encounter (HOSPITAL_COMMUNITY): Payer: Self-pay | Admitting: Oncology

## 2014-09-23 DIAGNOSIS — C49A0 Gastrointestinal stromal tumor, unspecified site: Secondary | ICD-10-CM

## 2014-09-23 DIAGNOSIS — C49A3 Gastrointestinal stromal tumor of small intestine: Secondary | ICD-10-CM | POA: Insufficient documentation

## 2014-09-23 HISTORY — DX: Gastrointestinal stromal tumor, unspecified site: C49.A0

## 2014-10-07 ENCOUNTER — Encounter (HOSPITAL_COMMUNITY): Payer: Self-pay | Admitting: Hematology & Oncology

## 2014-10-07 ENCOUNTER — Encounter (HOSPITAL_COMMUNITY): Payer: Medicaid Other | Attending: Hematology & Oncology | Admitting: Hematology & Oncology

## 2014-10-07 ENCOUNTER — Encounter (HOSPITAL_BASED_OUTPATIENT_CLINIC_OR_DEPARTMENT_OTHER): Payer: Medicaid Other

## 2014-10-07 ENCOUNTER — Encounter: Payer: Self-pay | Admitting: Internal Medicine

## 2014-10-07 VITALS — BP 161/95 | HR 76 | Temp 97.6°F | Resp 20 | Wt 188.6 lb

## 2014-10-07 DIAGNOSIS — R109 Unspecified abdominal pain: Secondary | ICD-10-CM | POA: Insufficient documentation

## 2014-10-07 DIAGNOSIS — F1410 Cocaine abuse, uncomplicated: Secondary | ICD-10-CM | POA: Insufficient documentation

## 2014-10-07 DIAGNOSIS — Z79899 Other long term (current) drug therapy: Secondary | ICD-10-CM | POA: Insufficient documentation

## 2014-10-07 DIAGNOSIS — G8929 Other chronic pain: Secondary | ICD-10-CM | POA: Insufficient documentation

## 2014-10-07 DIAGNOSIS — C49A0 Gastrointestinal stromal tumor, unspecified site: Secondary | ICD-10-CM

## 2014-10-07 DIAGNOSIS — C494 Malignant neoplasm of connective and soft tissue of abdomen: Secondary | ICD-10-CM | POA: Diagnosis present

## 2014-10-07 DIAGNOSIS — K219 Gastro-esophageal reflux disease without esophagitis: Secondary | ICD-10-CM | POA: Diagnosis not present

## 2014-10-07 DIAGNOSIS — F1010 Alcohol abuse, uncomplicated: Secondary | ICD-10-CM | POA: Insufficient documentation

## 2014-10-07 DIAGNOSIS — Z9049 Acquired absence of other specified parts of digestive tract: Secondary | ICD-10-CM | POA: Insufficient documentation

## 2014-10-07 DIAGNOSIS — F17210 Nicotine dependence, cigarettes, uncomplicated: Secondary | ICD-10-CM | POA: Diagnosis not present

## 2014-10-07 DIAGNOSIS — C499 Malignant neoplasm of connective and soft tissue, unspecified: Secondary | ICD-10-CM

## 2014-10-07 DIAGNOSIS — K439 Ventral hernia without obstruction or gangrene: Secondary | ICD-10-CM | POA: Insufficient documentation

## 2014-10-07 DIAGNOSIS — Z9119 Patient's noncompliance with other medical treatment and regimen: Secondary | ICD-10-CM | POA: Diagnosis not present

## 2014-10-07 DIAGNOSIS — Z7982 Long term (current) use of aspirin: Secondary | ICD-10-CM | POA: Insufficient documentation

## 2014-10-07 DIAGNOSIS — F319 Bipolar disorder, unspecified: Secondary | ICD-10-CM | POA: Diagnosis not present

## 2014-10-07 LAB — COMPREHENSIVE METABOLIC PANEL
ALBUMIN: 3.8 g/dL (ref 3.5–5.0)
ALT: 23 U/L (ref 17–63)
AST: 45 U/L — AB (ref 15–41)
Alkaline Phosphatase: 95 U/L (ref 38–126)
Anion gap: 8 (ref 5–15)
BUN: 7 mg/dL (ref 6–20)
CO2: 28 mmol/L (ref 22–32)
Calcium: 8.1 mg/dL — ABNORMAL LOW (ref 8.9–10.3)
Chloride: 101 mmol/L (ref 101–111)
Creatinine, Ser: 0.95 mg/dL (ref 0.61–1.24)
GFR calc Af Amer: >60 mL/min (ref >60)
GFR calc non Af Amer: >60 mL/min (ref >60)
Glucose, Bld: 132 mg/dL — ABNORMAL HIGH (ref 65–99)
Potassium: 3.5 mmol/L (ref 3.5–5.1)
SODIUM: 137 mmol/L (ref 135–145)
Total Bilirubin: 0.6 mg/dL (ref 0.3–1.2)
Total Protein: 7.2 g/dL (ref 6.5–8.1)

## 2014-10-07 LAB — CBC WITH DIFFERENTIAL/PLATELET
BASOS ABS: 0 10*3/uL (ref 0.0–0.1)
BASOS PCT: 0 % (ref 0–1)
EOS ABS: 0.1 10*3/uL (ref 0.0–0.7)
EOS PCT: 2 % (ref 0–5)
HCT: 39.3 % (ref 39.0–52.0)
Hemoglobin: 13.6 g/dL (ref 13.0–17.0)
Lymphocytes Relative: 27 % (ref 12–46)
Lymphs Abs: 1.7 10*3/uL (ref 0.7–4.0)
MCH: 35.3 pg — ABNORMAL HIGH (ref 26.0–34.0)
MCHC: 34.6 g/dL (ref 30.0–36.0)
MCV: 102.1 fL — ABNORMAL HIGH (ref 78.0–100.0)
MONO ABS: 0.7 10*3/uL (ref 0.1–1.0)
Monocytes Relative: 11 % (ref 3–12)
Neutro Abs: 3.7 10*3/uL (ref 1.7–7.7)
Neutrophils Relative %: 60 % (ref 43–77)
Platelets: 124 10*3/uL — ABNORMAL LOW (ref 150–400)
RBC: 3.85 MIL/uL — ABNORMAL LOW (ref 4.22–5.81)
RDW: 13.8 % (ref 11.5–15.5)
WBC: 6.3 10*3/uL (ref 4.0–10.5)

## 2014-10-07 LAB — RAPID URINE DRUG SCREEN, HOSP PERFORMED
Amphetamines: NOT DETECTED
BARBITURATES: NOT DETECTED
Benzodiazepines: NOT DETECTED
COCAINE: NOT DETECTED
OPIATES: NOT DETECTED
Tetrahydrocannabinol: NOT DETECTED

## 2014-10-07 LAB — ETHANOL: Alcohol, Ethyl (B): <5 mg/dL (ref <5)

## 2014-10-07 LAB — PHOSPHORUS: PHOSPHORUS: 2.5 mg/dL (ref 2.5–4.6)

## 2014-10-07 MED ORDER — MORPHINE SULFATE ER 60 MG PO TBCR: 60.0000 mg | EXTENDED_RELEASE_TABLET | Freq: Two times a day (BID) | ORAL | Status: DC

## 2014-10-07 MED ORDER — OXYCODONE HCL 10 MG PO TABS: 10.0000 mg | ORAL_TABLET | ORAL | Status: DC | PRN

## 2014-10-07 NOTE — Progress Notes (Signed)
LABS DRAWN

## 2014-10-07 NOTE — Patient Instructions (Signed)
..  [place] at [facility] Discharge Instructions  RECOMMENDATIONS MADE BY THE CONSULTANT AND ANY TEST RESULTS WILL BE SENT TO YOUR REFERRING PHYSICIAN.  Exam per Dr. Whitney Muse Continue gleevec  Refer to GI first available for persistent nausea  Return to see [place] in 1 month with labs  Thank you for choosing [facility] to provide your oncology and hematology care.  To afford each patient quality time with our provider, please arrive at least 15 minutes before your scheduled appointment time.    You need to re-schedule your appointment should you arrive 10 or more minutes late.  We strive to give you quality time with our providers, and arriving late affects you and other patients whose appointments are after yours.  Also, if you no show three or more times for appointments you may be dismissed from the clinic at the providers discretion.     Again, thank you for choosing [facility].  Our hope is that these requests will decrease the amount of time that you wait before being seen by our physicians.       _____________________________________________________________  Should you have questions after your visit to [facility], please contact our office at [phone] between the hours of 8:30 a.m. and 4:30 p.m.  Voicemails left after 4:30 p.m. will not be returned until the following business day.  For prescription refill requests, have your pharmacy contact our office.

## 2014-10-07 NOTE — Progress Notes (Signed)
[place] [facility] [place] NOTE  Patient Care Team: [name], MD as PCP - General (Internal Medicine) Daneil Dolin, MD as Consulting Physician (Gastroenterology)  CHIEF COMPLAINTS/PURPOSE OF CONSULTATION:  GI stromal tumor diagnosed in 2014 S/p partial small bowel resection 09/04/2012 Gleevec started 04/13/2013 Seen at [facility] Patient has a history of alcohol and cocaine abuse History of medical noncompliance with Gleevec    HISTORY OF PRESENTING ILLNESS:  [name] 54 y.o. 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 He has been followed at [facility] and previously at [facility].   He is here alone today. He states he has requested to be transferred from [facility] because of difficulty with travel issues. He says he also "had a bad falling out with his oncologist at [facility] because his doctor was sending his prescriptions through the mail and he got in trouble doing so." Per records from [facility] he was formally discharged from their care because of ongoing alcohol use and pain medication requests. He is also noted to be non-compliant with his gleevec. On chart review he has had several no-shows for appointments and scans. He again attributes this to travel difficulties. He did have a consultation with pain management at [facility] on 04/10/14 with Dr. Wynetta Emery but smelled of alcohol and pain medication was therefore not prescribed. It was also noted that that per the [facility] registry he may be using more than prescribed. He states he simply cannot go to so many doctor appointments so far away.  He states that Dr. Francesco Runner in [place] will not see him for pain management. He has been referred for substance abuse treatment for his drinking. He states that drinking beer helps him eat. The pain in his belly is constant. He cannot have anything touch his stomach so he typically wears boxer shorts or sweat pants. He mentions the belt he is currently wearing is causing him discomfort. Anytime  he sneezes or coughs he has to grab his belly in pain.Per records from [facility] he was formally discharged from the their care because of ongoing alcohol use and pain medication requests. He is also noted to be non-compliant with his gleevec.   He understands why he is taking the [place], knowing that it helps to keep his cancer "sleeping".  He has been taking antibiotics since a recent visit to [facility] ED.   He thinks these are what makes him nauseated.  He says that he has been getting up at 2, 3 AM and vomiting.  When he wakes up in the morning, he also feels sick and states that after these "episodes" past, he is then okay for the rest of the day.  The patient says that he has no life.  He would like to get out and volunteer and do other things however he is limited due to no transportation.  He is down to smoking 1 ppd. He states he has no more pain medication, he ran out several days ago. He had to take more because he was not feeling well (visit to ED).  He knows he cannot have early refills.   MEDICAL HISTORY:  Past Medical History  Diagnosis Date  . GIST (gastrointestinal stroma tumor), malignant, colon   . Alcohol abuse   . Ventral hernia   . PE (physical exam), annual   . Tobacco abuse   . Anemia   . GERD (gastroesophageal reflux disease)   . Hx of bipolar disorder   . Pancreatitis   .  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 Colectomy    . Hernia repair      SOCIAL HISTORY: Social History   Social History  . Marital Status: Single    Spouse Name: [name]  . Number of Children: [name]  . Years of Education: [name]   Occupational History  . Not on file.   Social History Main Topics  . Smoking status: Current Every Day Smoker -- 1.00 packs/day for 36 years    Types: Cigarettes  . Smokeless tobacco: Never Used  . Alcohol Use: 1.2 - 1.8 oz/week    2-3 Cans of beer per week     Comment: 2 - 3 cans beer daily  .  Drug Use: No  . Sexual Activity: Not Currently   Other Topics Concern  . Not on file   Social History Narrative  Divorced. 1 son, 72 yo, who lives in [place] 1 grandchild in [place] he has never met. Smoker, used to smoke 3 ppd and now down to less than 1 ppd. Started around 13 or 54 yo Ex cocaine user, says he hasn't used "in a pretty good while" ETOH, he doesn't drink liquor or  go to bars anymore, he now drinks 2 cheap beers a day. He drank liquor heavily for about 10 years.  FAMILY HISTORY: Family History  Problem Relation Age of Onset  . Cancer Mother   . Hypertension Father   . Cancer Brother    indicated that his mother is alive. He indicated that his father is alive. He indicated that his brother is deceased.  Mother living about 56 yo, breast cancer. Father living about 63 yo, had a triple bypass about one year ago. 1 sister recently moved to [place], she is healthy 1 brother died from cancer at 60 yo, "something in his spine that went to his brain" 1 brother, healthy  ALLERGIES:  has No Known Allergies.  MEDICATIONS:  Current Outpatient Prescriptions  Medication Sig Dispense Refill  . Aspirin-Caffeine 845-65 MG PACK Take 1 packet by mouth as needed.    . busPIRone (BUSPAR) 5 MG tablet Take 1 tablet (5 mg total) by mouth 3 (three) times daily. 90 tablet 2  . imatinib (GLEEVEC) 400 MG tablet Take 1 tablet (400 mg total) by mouth daily. 30 tablet 2  . mirtazapine (REMERON) 45 MG tablet Take 1 tablet (45 mg total) by mouth at bedtime as needed and may repeat dose one time if needed. 30 tablet 2  . omeprazole (PRILOSEC) 40 MG capsule Take 1 capsule (40 mg total) by mouth daily. 30 capsule 2  . prochlorperazine (COMPAZINE) 10 MG tablet Take 10 mg by mouth as needed.    Marland Kitchen morphine (MS CONTIN) 60 MG 12 hr tablet Take 1 tablet (60 mg total) by mouth every 12 (twelve) hours. 60 tablet 0  . Oxycodone HCl 10 MG TABS Take 1 tablet (10 mg total) by mouth every 4 (four) hours  as needed. 60 tablet 0  . simethicone (MYLICON) 161 MG chewable tablet Chew 125 mg by mouth as needed.     No current facility-administered medications for this visit.    Review of Systems  Constitutional: Positive for malaise/fatigue.  Gastrointestinal: Positive for abdominal pain. Negative for constipation.       Acid reflux.  Musculoskeletal: Positive for falls.  Psychiatric/Behavioral: Positive for substance abuse. The patient has insomnia.    14 point ROS was done and is otherwise  as detailed above or in HPI  PHYSICAL EXAMINATION: ECOG PERFORMANCE STATUS: 1 - Symptomatic but completely ambulatory  Filed Vitals:   10/07/14 1013  BP: 161/95  Pulse: 76  Temp: 97.6 F (36.4 C)  Resp: 20   Filed Weights   10/07/14 1011  Weight: 188 lb 9.6 oz (85.548 kg)     Physical Exam  Constitutional: He is oriented to person, place, and time and well-developed, well-nourished, and in no distress.  Edentulous. No noticeable ETOH smell today  HENT:  Head: Normocephalic and atraumatic.  Nose: Nose normal.  Mouth/Throat: Oropharynx is clear and moist. No oropharyngeal exudate.  Eyes: Conjunctivae and EOM are normal. Pupils are equal, round, and reactive to light. Right eye exhibits no discharge. Left eye exhibits no discharge. No scleral icterus.  Neck: Normal range of motion. Neck supple. No tracheal deviation present. No thyromegaly present.  Cardiovascular: Normal rate, regular rhythm and normal heart sounds.  Exam reveals no gallop and no friction rub.   No murmur heard. Pulmonary/Chest: Effort normal and breath sounds normal. He has no wheezes. He has no rales.  Abdominal: Soft. Bowel sounds are normal. He exhibits no distension and no mass. There is tenderness. There is no rebound and no guarding.  Large incisional Hernia present. Multiple surgical scars  Musculoskeletal: Normal range of motion. He exhibits no edema.  Lymphadenopathy:    He has no cervical adenopathy.    Neurological: He is alert and oriented to person, place, and time. He has normal reflexes. No cranial nerve deficit. Gait normal. Coordination normal.  Skin: Skin is warm and dry. No rash noted.  Psychiatric: Mood, memory, affect and judgment normal.  Nursing note and vitals reviewed.    LABORATORY DATA:  I have reviewed the data as listed CBC    Component Value Date/Time   WBC 6.1 11/04/2014 0916   RBC 3.88* 11/04/2014 0916   HGB 13.7 11/04/2014 0916   HCT 39.5 11/04/2014 0916   PLT 134* 11/04/2014 0916   MCV 101.8* 11/04/2014 0916   MCH 35.3* 11/04/2014 0916   MCHC 34.7 11/04/2014 0916   RDW 14.7 11/04/2014 0916   LYMPHSABS 1.7 11/04/2014 0916   MONOABS 0.6 11/04/2014 0916   EOSABS 0.1 11/04/2014 0916   BASOSABS 0.0 11/04/2014 0916    CMP     Component Value Date/Time   NA 139 11/04/2014 0916   K 3.5 11/04/2014 0916   CL 105 11/04/2014 0916   CO2 26 11/04/2014 0916   GLUCOSE 98 11/04/2014 0916   BUN 6 11/04/2014 0916   CREATININE 0.88 11/04/2014 0916   CALCIUM 8.4* 11/04/2014 0916   PROT 7.8 11/04/2014 0916   ALBUMIN 4.0 11/04/2014 0916   AST 82* 11/04/2014 0916   ALT 38 11/04/2014 0916   ALKPHOS 102 11/04/2014 0916   BILITOT 0.3 11/04/2014 0916   GFRNONAA >60 11/04/2014 0916   GFRAA >60 11/04/2014 0916     RADIOGRAPHIC STUDIES: I have personally reviewed the radiological images as listed and agreed with the findings in the report. Imaging reviewed per [facility] and care everywhere  CLINICAL DATA: GI stromal tumor, malignant. Colonic primary. Tobacco abuse. Alcohol abuse. Gastroesophageal reflux disease. History of pancreatitis. Smoker.  EXAM: CT CHEST, ABDOMEN, AND PELVIS WITH CONTRAST  TECHNIQUE: Multidetector CT imaging of the chest, abdomen and pelvis was performed following the standard protocol during bolus administration of intravenous contrast.  CONTRAST: 154m OMNIPAQUE IOHEXOL 300 MG/ML SOLN  COMPARISON: Abdominal  pelvic CT of 03/04/2010. No prior chest CT.  Chest radiograph of 12/01/2007 is reviewed. IMPRESSION: 1. Interval development of omental/peritoneal metastasis, including a dominant left abdominal 2.6 cm pericolonic nodule. 2. No acute process or evidence of metastatic disease in the chest. 3. Age advanced coronary artery atherosclerosis. Recommend assessment of coronary risk factors and consideration of medical therapy. 4. Abdominal wall laxity containing nonobstructive transverse colon. Fat containing ventral abdominal wall hernias. 5. Mild motion degradation within the upper abdomen. 6. Emphysema.   Electronically Signed  By: Abigail Miyamoto M.D.  On: 08/25/2014 11:41  FILMS WERE TAKEN TO RADIOLOGY FROM [facility] ON CD AND REVIEWED WITH THE RADIOLOGIST. DISEASE IS STABLE TO SLIGHTLY IMPROVED.  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 He insists he has been compliant over the past few months.I have encouraged ongoing compliance. We [facility] a [facility] of [facility] to the radiologist for comparison and they were stable to slightly improved. The images will have to be imported into Epic for an addendum report to be filed.   In regards to his pain I advised the patient I will not provide any additional short acting oxycodone other than 60 tablets a month. I have recommended increasing his long-acting pain medication with the goal of getting him to use very few short acting oxycodone. Currently he is agreeable. I discussed with him that I was aware of his past problems with narcotics refills at his prior institutions. I do not doubt that he has pain but I have advised him  that we will be monitoring his use closely. I have ordered a urine drug screen and alcohol level at his next visit. He has no opiods in his urine today and that is concerning. However, there are no other drugs observed and ETOH is normal.  He insists he took his medications more often secondary to increased pain and has not had them for about a week. WE will continue to monitor closely and if his UDS remains negative ie. Without opioids he will not be prescribed additional narcotics.   I advised him NOT to ask for early refills. Repeated requests will lead to [place] not prescribing, in addition, should his UDS reveal any other substances that those prescribed or should it reveal he is not taking his medication he will no longer be given medication through our clinic.   Follow up in 1 month. Refer to GI for nausea/vomiting.  Orders Placed This Encounter  Procedures  . CBC with Differential    Standing Status: Future     Number of Occurrences: 1     Standing Expiration Date: 10/07/2015  . Comprehensive metabolic panel    Standing Status: Future     Number of Occurrences: 1     Standing Expiration Date: 10/07/2015  . Drug Screen + Alcohol, Oral    Standing Status: Future     Number of Occurrences: 1     Standing Expiration Date: 10/07/2015    All questions were answered. The patient knows to call the clinic with any problems, questions or concerns.    This document serves as a record of services personally performed by Ancil Linsey, MD. It was created on her behalf by Janace Hoard, a trained medical scribe. The creation of this record is based  on the scribe's personal observations and the provider's statements to them. This document has been checked and approved by the attending provider.  I have reviewed the above documentation for accuracy and completeness, and I agree with the above.  This note was electronically signed.  Kelby Fam. Whitney Muse, MD

## 2014-10-27 ENCOUNTER — Ambulatory Visit: Payer: Medicaid Other | Admitting: Gastroenterology

## 2014-11-04 ENCOUNTER — Encounter (HOSPITAL_COMMUNITY): Payer: Self-pay | Admitting: Hematology & Oncology

## 2014-11-04 ENCOUNTER — Encounter (HOSPITAL_COMMUNITY): Payer: Medicaid Other | Attending: Hematology & Oncology

## 2014-11-04 ENCOUNTER — Encounter (HOSPITAL_BASED_OUTPATIENT_CLINIC_OR_DEPARTMENT_OTHER): Payer: Medicaid Other | Admitting: Hematology & Oncology

## 2014-11-04 VITALS — BP 145/92 | HR 92 | Temp 99.0°F | Resp 18 | Wt 186.0 lb

## 2014-11-04 DIAGNOSIS — Z7982 Long term (current) use of aspirin: Secondary | ICD-10-CM | POA: Diagnosis not present

## 2014-11-04 DIAGNOSIS — F319 Bipolar disorder, unspecified: Secondary | ICD-10-CM | POA: Insufficient documentation

## 2014-11-04 DIAGNOSIS — K219 Gastro-esophageal reflux disease without esophagitis: Secondary | ICD-10-CM | POA: Diagnosis not present

## 2014-11-04 DIAGNOSIS — G8929 Other chronic pain: Secondary | ICD-10-CM | POA: Diagnosis not present

## 2014-11-04 DIAGNOSIS — Z79899 Other long term (current) drug therapy: Secondary | ICD-10-CM | POA: Diagnosis not present

## 2014-11-04 DIAGNOSIS — Z9119 Patient's noncompliance with other medical treatment and regimen: Secondary | ICD-10-CM | POA: Diagnosis not present

## 2014-11-04 DIAGNOSIS — C499 Malignant neoplasm of connective and soft tissue, unspecified: Secondary | ICD-10-CM | POA: Diagnosis present

## 2014-11-04 DIAGNOSIS — F1010 Alcohol abuse, uncomplicated: Secondary | ICD-10-CM | POA: Insufficient documentation

## 2014-11-04 DIAGNOSIS — F17210 Nicotine dependence, cigarettes, uncomplicated: Secondary | ICD-10-CM | POA: Diagnosis not present

## 2014-11-04 DIAGNOSIS — R109 Unspecified abdominal pain: Secondary | ICD-10-CM | POA: Insufficient documentation

## 2014-11-04 DIAGNOSIS — K439 Ventral hernia without obstruction or gangrene: Secondary | ICD-10-CM | POA: Diagnosis not present

## 2014-11-04 DIAGNOSIS — Z9049 Acquired absence of other specified parts of digestive tract: Secondary | ICD-10-CM | POA: Diagnosis not present

## 2014-11-04 DIAGNOSIS — C49A0 Gastrointestinal stromal tumor, unspecified site: Secondary | ICD-10-CM

## 2014-11-04 DIAGNOSIS — C494 Malignant neoplasm of connective and soft tissue of abdomen: Secondary | ICD-10-CM | POA: Insufficient documentation

## 2014-11-04 DIAGNOSIS — F1410 Cocaine abuse, uncomplicated: Secondary | ICD-10-CM | POA: Diagnosis not present

## 2014-11-04 LAB — COMPREHENSIVE METABOLIC PANEL
ALT: 38 U/L (ref 17–63)
ANION GAP: 8 (ref 5–15)
AST: 82 U/L — ABNORMAL HIGH (ref 15–41)
Albumin: 4 g/dL (ref 3.5–5.0)
Alkaline Phosphatase: 102 U/L (ref 38–126)
BUN: 6 mg/dL (ref 6–20)
CHLORIDE: 105 mmol/L (ref 101–111)
CO2: 26 mmol/L (ref 22–32)
Calcium: 8.4 mg/dL — ABNORMAL LOW (ref 8.9–10.3)
Creatinine, Ser: 0.88 mg/dL (ref 0.61–1.24)
Glucose, Bld: 98 mg/dL (ref 65–99)
Potassium: 3.5 mmol/L (ref 3.5–5.1)
SODIUM: 139 mmol/L (ref 135–145)
Total Bilirubin: 0.3 mg/dL (ref 0.3–1.2)
Total Protein: 7.8 g/dL (ref 6.5–8.1)

## 2014-11-04 LAB — RAPID URINE DRUG SCREEN, HOSP PERFORMED
Amphetamines: NOT DETECTED
BARBITURATES: NOT DETECTED
BENZODIAZEPINES: NOT DETECTED
COCAINE: POSITIVE — AB
OPIATES: NOT DETECTED
TETRAHYDROCANNABINOL: NOT DETECTED

## 2014-11-04 LAB — CBC WITH DIFFERENTIAL/PLATELET
Basophils Absolute: 0 10*3/uL (ref 0.0–0.1)
Basophils Relative: 1 %
EOS ABS: 0.1 10*3/uL (ref 0.0–0.7)
EOS PCT: 1 %
HCT: 39.5 % (ref 39.0–52.0)
Hemoglobin: 13.7 g/dL (ref 13.0–17.0)
LYMPHS ABS: 1.7 10*3/uL (ref 0.7–4.0)
Lymphocytes Relative: 27 %
MCH: 35.3 pg — AB (ref 26.0–34.0)
MCHC: 34.7 g/dL (ref 30.0–36.0)
MCV: 101.8 fL — ABNORMAL HIGH (ref 78.0–100.0)
MONO ABS: 0.6 10*3/uL (ref 0.1–1.0)
MONOS PCT: 10 %
Neutro Abs: 3.7 10*3/uL (ref 1.7–7.7)
Neutrophils Relative %: 61 %
PLATELETS: 134 10*3/uL — AB (ref 150–400)
RBC: 3.88 MIL/uL — AB (ref 4.22–5.81)
RDW: 14.7 % (ref 11.5–15.5)
WBC: 6.1 10*3/uL (ref 4.0–10.5)

## 2014-11-04 LAB — ETHANOL: Alcohol, Ethyl (B): 128 mg/dL — ABNORMAL HIGH (ref <5)

## 2014-11-04 MED ORDER — OXYCODONE HCL 10 MG PO TABS: 10.0000 mg | ORAL_TABLET | ORAL | Status: DC | PRN

## 2014-11-04 MED ORDER — MORPHINE SULFATE ER 60 MG PO TBCR: 60.0000 mg | EXTENDED_RELEASE_TABLET | Freq: Two times a day (BID) | ORAL | Status: DC

## 2014-11-04 NOTE — Patient Instructions (Addendum)
[place] at [facility] Discharge Instructions  RECOMMENDATIONS MADE BY THE CONSULTANT AND ANY TEST RESULTS WILL BE SENT TO YOUR REFERRING PHYSICIAN.  Return in one month as scheduled. Referred to our social worker, Abby Potash. She will be in touch with you.   Thank you for choosing [place] at [facility] to provide your oncology and hematology care.  To afford each patient quality time with our Dhruti Ghuman, please arrive at least 15 minutes before your scheduled appointment time.    You need to re-schedule your appointment should you arrive 10 or more minutes late.  We strive to give you quality time with our providers, and arriving late affects you and other patients whose appointments are after yours.  Also, if you no show three or more times for appointments you may be dismissed from the clinic at the providers discretion.     Again, thank you for choosing [facility].  Our hope is that these requests will decrease the amount of time that you wait before being seen by our physicians.       _____________________________________________________________  Should you have questions after your visit to [facility], please contact our office at [phone] between the hours of 8:30 a.m. and 4:30 p.m.  Voicemails left after 4:30 p.m. will not be returned until the following business day.  For prescription refill requests, have your pharmacy contact our office.

## 2014-11-04 NOTE — Progress Notes (Signed)
Labs drawn

## 2014-11-04 NOTE — Progress Notes (Signed)
[facility] NOTE  Patient Care Team: [name], MD as PCP - General (Internal Medicine) Daneil Dolin, MD as Consulting Physician (Gastroenterology)  CHIEF COMPLAINTS/PURPOSE OF CONSULTATION:  GI stromal tumor diagnosed in 2014 S/p partial small bowel resection 09/04/2012 Gleevec started 04/13/2013 Seen at [facility] Patient has a history of alcohol and cocaine abuse History of medical noncompliance with Gleevec    HISTORY OF PRESENTING ILLNESS:  [name] 54 y.o. 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 He has been followed at [facility] and previously at [facility].    The patient reports that he is depressed.  He is open to seeing a Social worker.  He notes that he was told someone would would be coming to his house this week. He would like to volunteer at an animal shelter about 1 mile from his home however does not have transportation and states that he cannot walk good anymore due to fatigue.  He expresses that "he needs to do something with his life" .  He is having abdominal pain today.  He states that he has slacked off on both drinking and smoking.  He takes his Gleevec daily.   MEDICAL HISTORY:  Past Medical History  Diagnosis Date  . GIST (gastrointestinal stroma tumor), malignant, colon   . Alcohol abuse   . Ventral hernia   . PE (physical exam), annual   . Tobacco abuse   . Anemia   . GERD (gastroesophageal reflux disease)   . Hx of bipolar disorder   . Pancreatitis   . 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 Colectomy    . Hernia repair      SOCIAL HISTORY: Social History   Social History  . Marital Status: Single    Spouse Name: [name]  . Number of Children: [name]  . Years of Education: [name]   Occupational History  . Not on file.   Social History Main Topics  . Smoking status: Current Every Day Smoker -- 1.00 packs/day for 36 years    Types: Cigarettes    . Smokeless tobacco: Never Used  . Alcohol Use: 1.2 - 1.8 oz/week    2-3 Cans of beer per week     Comment: 2 - 3 cans beer daily  . Drug Use: No  . Sexual Activity: Not Currently   Other Topics Concern  . Not on file   Social History Narrative  Divorced. 1 son, 32 yo, who lives in [place] 1 grandchild in [place] he has never met. Smoker, used to smoke 3 ppd and now down to less than 1 ppd. Started around 31 or 54 yo Ex cocaine user, says he hasn't used "in a pretty good while" ETOH, he doesn't drink liquor or  go to bars anymore, he now drinks 2 cheap beers a day. He drank liquor heavily for about 10 years.  FAMILY HISTORY: Family History  Problem Relation Age of Onset  . Cancer Mother   . Hypertension Father   . Cancer Brother    indicated that his mother is alive. He indicated that his father is alive. He indicated that his brother is deceased.  Mother living about 51 yo, breast cancer. Father living about 39 yo, had a triple bypass about one year ago. 1 sister recently moved to [place], she is healthy 1 brother died from cancer at 62 yo, "something in his spine that went to his  brain" 1 brother, healthy  ALLERGIES:  has No Known Allergies.  MEDICATIONS:  Current Outpatient Prescriptions  Medication Sig Dispense Refill  . Aspirin-Caffeine 845-65 MG PACK Take 1 packet by mouth as needed.    . busPIRone (BUSPAR) 5 MG tablet Take 1 tablet (5 mg total) by mouth 3 (three) times daily. 90 tablet 2  . imatinib (GLEEVEC) 400 MG tablet Take 1 tablet (400 mg total) by mouth daily. 30 tablet 2  . mirtazapine (REMERON) 45 MG tablet Take 1 tablet (45 mg total) by mouth at bedtime as needed and may repeat dose one time if needed. 30 tablet 2  . morphine (MS CONTIN) 60 MG 12 hr tablet Take 1 tablet (60 mg total) by mouth every 12 (twelve) hours. 60 tablet 0  . omeprazole (PRILOSEC) 40 MG capsule Take 1 capsule (40 mg total) by mouth daily. 30 capsule 2  . Oxycodone HCl 10 MG  TABS Take 1 tablet (10 mg total) by mouth every 4 (four) hours as needed. 60 tablet 0  . prochlorperazine (COMPAZINE) 10 MG tablet Take 10 mg by mouth as needed.    . simethicone (MYLICON) 545 MG chewable tablet Chew 125 mg by mouth as needed.     No current facility-administered medications for this visit.    Review of Systems  Constitutional: Positive for malaise/fatigue.  Gastrointestinal: Positive for abdominal pain. Negative for constipation.       Acid reflux.  Musculoskeletal: Positive for falls.  Psychiatric/Behavioral: Positive for substance abuse. The patient has insomnia.    14 point ROS was done and is otherwise as detailed above or in HPI   PHYSICAL EXAMINATION: ECOG PERFORMANCE STATUS: 1 - Symptomatic but completely ambulatory  Filed Vitals:   11/04/14 0914  BP: 145/92  Pulse: 92  Temp: 99 F (37.2 C)  Resp: 18   Filed Weights   11/04/14 0914  Weight: 186 lb (84.369 kg)     Physical Exam  Constitutional: He is oriented to person, place, and time and well-developed, well-nourished, and in no distress.  Edentulous. Noticeable ETOH smell today  HENT:  Head: Normocephalic and atraumatic.  Nose: Nose normal.  Mouth/Throat: Oropharynx is clear and moist. No oropharyngeal exudate.  Eyes: Conjunctivae and EOM are normal. Pupils are equal, round, and reactive to light. Right eye exhibits no discharge. Left eye exhibits no discharge. No scleral icterus.  Neck: Normal range of motion. Neck supple. No tracheal deviation present. No thyromegaly present.  Cardiovascular: Normal rate, regular rhythm and normal heart sounds.  Exam reveals no gallop and no friction rub.   No murmur heard. Pulmonary/Chest: Effort normal and breath sounds normal. He has no wheezes. He has no rales.  Abdominal: Soft. Bowel sounds are normal. He exhibits no distension and no mass. There is tenderness. There is no rebound and no guarding.  Large incisional Hernia present. Multiple surgical  scars  Musculoskeletal: Normal range of motion. He exhibits no edema.  Lymphadenopathy:    He has no cervical adenopathy.  Neurological: He is alert and oriented to person, place, and time. He has normal reflexes. No cranial nerve deficit. Gait normal. Coordination normal.  Skin: Skin is warm and dry. No rash noted.  Psychiatric: Mood, memory, affect and judgment normal.  Nursing note and vitals reviewed.   LABORATORY DATA:  I have reviewed the data as listed CBC    Component Value Date/Time   WBC 6.1 11/04/2014 0916   RBC 3.88* 11/04/2014 0916   HGB 13.7 11/04/2014 0916  HCT 39.5 11/04/2014 0916   PLT 134* 11/04/2014 0916   MCV 101.8* 11/04/2014 0916   MCH 35.3* 11/04/2014 0916   MCHC 34.7 11/04/2014 0916   RDW 14.7 11/04/2014 0916   LYMPHSABS 1.7 11/04/2014 0916   MONOABS 0.6 11/04/2014 0916   EOSABS 0.1 11/04/2014 0916   BASOSABS 0.0 11/04/2014 0916    CMP     Component Value Date/Time   NA 139 11/04/2014 0916   K 3.5 11/04/2014 0916   CL 105 11/04/2014 0916   CO2 26 11/04/2014 0916   GLUCOSE 98 11/04/2014 0916   BUN 6 11/04/2014 0916   CREATININE 0.88 11/04/2014 0916   CALCIUM 8.4* 11/04/2014 0916   PROT 7.8 11/04/2014 0916   ALBUMIN 4.0 11/04/2014 0916   AST 82* 11/04/2014 0916   ALT 38 11/04/2014 0916   ALKPHOS 102 11/04/2014 0916   BILITOT 0.3 11/04/2014 0916   GFRNONAA >60 11/04/2014 0916   GFRAA >60 11/04/2014 0916   Urine rapid drug screen (hosp performed)  Status: Finalresult Visible to patient:  Not Released Nextappt: 11/09/2014 at 11:00 AM in Oncology Robynn Pane, PA-C)           Notes Recorded by Patrici Ranks, MD on 11/04/2014 at 10:51 AM Khoen's etoh level this am.... Also positive cocaine UDS. Dr.P     Ref Range 9:14 AM    Opiates NONE DETECTED  NONE DETECTED   Cocaine NONE DETECTED  POSITIVE (A)   Benzodiazepines NONE DETECTED  NONE DETECTED   Amphetamines NONE DETECTED  NONE DETECTED   Tetrahydrocannabinol NONE  DETECTED  NONE DETECTED   Barbiturates NONE DETECTED  NONE DETECTED   Comments:     DRUG SCREEN FOR MEDICAL PURPOSES  ONLY. IF CONFIRMATION IS NEEDED  FOR ANY PURPOSE, NOTIFY LAB  WITHIN 5 DAYS.      LOWEST DETECTABLE LIMITS  FOR URINE DRUG SCREEN  Drug Class    Cutoff (ng/mL)  Amphetamine   1000  Barbiturate   200  Benzodiazepine  741  Tricyclics    287  Opiates     300  Cocaine     300  THC       50     Resulting Agency [facility]       Specimen Collected: 11/04/14 9:14 AM   Last Resulted: 11/04/14 10:11 AM           RADIOGRAPHIC STUDIES: I have personally reviewed the radiological images as listed and agreed with the findings in the report. Imaging reviewed per [facility] and care everywhere CLINICAL DATA: GI stromal tumor, malignant. Colonic primary. Tobacco abuse. Alcohol abuse. Gastroesophageal reflux disease. History of pancreatitis. Smoker.  EXAM: CT CHEST, ABDOMEN, AND PELVIS WITH CONTRAST  TECHNIQUE: Multidetector CT imaging of the chest, abdomen and pelvis was performed following the standard protocol during bolus administration of intravenous contrast.  CONTRAST: 164m OMNIPAQUE IOHEXOL 300 MG/ML SOLN  COMPARISON: Abdominal pelvic CT of 03/04/2010. No prior chest CT. Chest radiograph of 12/01/2007 is reviewed. IMPRESSION: 1. Interval development of omental/peritoneal metastasis, including a dominant left abdominal 2.6 cm pericolonic nodule. 2. No acute process or evidence of metastatic disease in the chest. 3. Age advanced coronary artery atherosclerosis. Recommend assessment of coronary risk factors and consideration of medical therapy. 4. Abdominal wall laxity containing nonobstructive transverse colon. Fat containing ventral abdominal wall hernias. 5. Mild motion degradation within the upper abdomen. 6. Emphysema.   Electronically Signed  By: KAbigail MiyamotoM.D.  On:  08/25/2014 11:41  FILMS WERE TAKEN TO  RADIOLOGY FROM [facility] ON CD AND REVIEWED WITH THE RADIOLOGIST. DISEASE IS STABLE TO SLIGHTLY IMPROVED.  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 He insists he has been compliant over the past few months.I have encouraged ongoing compliance. We took a CD of [facility] to the radiologist for comparison and they were stable to slightly improved. The images will have to be imported into Epic for an addendum report to be filed.   Unfortunately, his urine drug screen revealed cocaine and alcohol. I smell of alcohol on him today although he denied it. He did admit that he has been very depressed the last several weeks. He states sitting at home all day is "not good for him." I am going to arrange for the patient to come in on a Tuesday to meet with social work. In addition I am going to discuss the violation of her pain medication agreement. His urine drug screen was negative for narcotics. We will work on getting him referred to behavioral health as well. NO ADDITIONAL narcotics will be given to the patient through our clinic.  Plan as detailed above. Note that the patient states he is compliant with his Gleevec, at this point I am not convinced he is always compliant.  Orders Placed This Encounter  Procedures  . CBC with Differential    Standing Status: Standing     Number of Occurrences: 24     Standing Expiration Date: 11/03/2016  . Comprehensive metabolic panel    Standing Status: Standing     Number of Occurrences: 24     Standing Expiration Date: 11/03/2016  . Urine rapid drug screen (hosp performed)    Standing Status:  Standing     Number of Occurrences: 24     Standing Expiration Date: 11/03/2016    All questions were answered. The patient knows to call the clinic with any problems, questions or concerns.   This document serves as a record of services personally performed by Ancil Linsey, MD. It was created on her behalf by Janace Hoard, a trained medical scribe. The creation of this record is based on the scribe's personal observations and the provider's statements to them. This document has been checked and approved by the attending provider.  I have reviewed the above documentation for accuracy and completeness, and I agree with the above.  This note was electronically signed.  Kelby Fam. Whitney Muse, MD

## 2014-11-09 ENCOUNTER — Ambulatory Visit (HOSPITAL_COMMUNITY): Payer: Medicaid Other | Admitting: Oncology

## 2014-11-09 ENCOUNTER — Encounter: Payer: Self-pay | Admitting: *Deleted

## 2014-11-09 NOTE — Progress Notes (Signed)
[facility] Clinical Social Work  Clinical Social Work was referred by Fut[employer] trader for assessment of psychosocial needs due to substance abuse and depression concerns. Clinical Social Worker reviewed chart and had planned to meet with patient today to further address these concerns, however, pt cancelled his appt for today. CSW attempted to reach out to pt via phone in order to follow up. CSW left message for pt to return CSW call. CSW will continue to be available to address needs.    Loren Racer, [place] Tuesdays   [phone]

## 2014-11-11 ENCOUNTER — Ambulatory Visit: Payer: Medicaid Other | Admitting: Nurse Practitioner

## 2014-11-22 ENCOUNTER — Ambulatory Visit: Payer: Medicaid Other | Admitting: Nurse Practitioner

## 2014-11-24 ENCOUNTER — Other Ambulatory Visit (HOSPITAL_COMMUNITY): Payer: Self-pay

## 2014-12-02 ENCOUNTER — Encounter (HOSPITAL_COMMUNITY): Payer: Self-pay | Admitting: Oncology

## 2014-12-02 ENCOUNTER — Encounter (HOSPITAL_COMMUNITY): Payer: Medicaid Other | Attending: Oncology | Admitting: Oncology

## 2014-12-02 ENCOUNTER — Encounter (HOSPITAL_COMMUNITY): Payer: Medicaid Other

## 2014-12-02 VITALS — BP 118/76 | HR 86 | Temp 98.6°F | Resp 18 | Wt 194.6 lb

## 2014-12-02 DIAGNOSIS — C49A0 Gastrointestinal stromal tumor, unspecified site: Secondary | ICD-10-CM

## 2014-12-02 DIAGNOSIS — C49A9 Gastrointestinal stromal tumor of other sites: Secondary | ICD-10-CM | POA: Diagnosis present

## 2014-12-02 LAB — COMPREHENSIVE METABOLIC PANEL
ALK PHOS: 86 U/L (ref 38–126)
ALT: 35 U/L (ref 17–63)
AST: 59 U/L — AB (ref 15–41)
Albumin: 3.6 g/dL (ref 3.5–5.0)
Anion gap: 11 (ref 5–15)
BILIRUBIN TOTAL: 0.3 mg/dL (ref 0.3–1.2)
CALCIUM: 7.9 mg/dL — AB (ref 8.9–10.3)
CO2: 21 mmol/L — AB (ref 22–32)
Chloride: 97 mmol/L — ABNORMAL LOW (ref 101–111)
Creatinine, Ser: 0.82 mg/dL (ref 0.61–1.24)
GFR calc Af Amer: >60 mL/min (ref >60)
GFR calc non Af Amer: >60 mL/min (ref >60)
GLUCOSE: 95 mg/dL (ref 65–99)
Potassium: 3.6 mmol/L (ref 3.5–5.1)
Sodium: 129 mmol/L — ABNORMAL LOW (ref 135–145)
TOTAL PROTEIN: 7.1 g/dL (ref 6.5–8.1)

## 2014-12-02 LAB — CBC WITH DIFFERENTIAL/PLATELET
BASOS ABS: 0 10*3/uL (ref 0.0–0.1)
BASOS PCT: 1 %
Eosinophils Absolute: 0.1 10*3/uL (ref 0.0–0.7)
Eosinophils Relative: 1 %
HEMATOCRIT: 34.7 % — AB (ref 39.0–52.0)
HEMOGLOBIN: 12.1 g/dL — AB (ref 13.0–17.0)
Lymphocytes Relative: 35 %
Lymphs Abs: 2.3 10*3/uL (ref 0.7–4.0)
MCH: 35.5 pg — ABNORMAL HIGH (ref 26.0–34.0)
MCHC: 34.9 g/dL (ref 30.0–36.0)
MCV: 101.8 fL — ABNORMAL HIGH (ref 78.0–100.0)
MONOS PCT: 9 %
Monocytes Absolute: 0.6 10*3/uL (ref 0.1–1.0)
NEUTROS ABS: 3.6 10*3/uL (ref 1.7–7.7)
NEUTROS PCT: 54 %
Platelets: 127 10*3/uL — ABNORMAL LOW (ref 150–400)
RBC: 3.41 MIL/uL — AB (ref 4.22–5.81)
RDW: 13.8 % (ref 11.5–15.5)
WBC: 6.5 10*3/uL (ref 4.0–10.5)

## 2014-12-02 LAB — RAPID URINE DRUG SCREEN, HOSP PERFORMED
Amphetamines: NOT DETECTED
Barbiturates: NOT DETECTED
Benzodiazepines: NOT DETECTED
Cocaine: NOT DETECTED
Opiates: NOT DETECTED
Tetrahydrocannabinol: NOT DETECTED

## 2014-12-02 NOTE — Patient Instructions (Signed)
..  [facility] at [facility] Discharge Instructions  RECOMMENDATIONS MADE BY THE CONSULTANT AND ANY TEST RESULTS WILL BE SENT TO YOUR REFERRING PHYSICIAN.  Exam per Dr. Whitney Muse Return in 2 months with labs  Thank you for choosing [place] at [facility] to provide your oncology and hematology care.  To afford each patient quality time with our provider, please arrive at least 15 minutes before your scheduled appointment time.    You need to re-schedule your appointment should you arrive 10 or more minutes late.  We strive to give you quality time with our providers, and arriving late affects you and other patients whose appointments are after yours.  Also, if you no show three or more times for appointments you may be dismissed from the clinic at the providers discretion.     Again, thank you for choosing [facility].  Our hope is that these requests will decrease the amount of time that you wait before being seen by our physicians.       _____________________________________________________________  Should you have questions after your visit to [facility], please contact our office at [phone] between the hours of 8:30 a.m. and 4:30 p.m.  Voicemails left after 4:30 p.m. will not be returned until the following business day.  For prescription refill requests, have your pharmacy contact our office.

## 2014-12-02 NOTE — Progress Notes (Signed)
.  [name]'s reason for visit today are for labs as scheduled per MD orders.  Venipuncture performed with a 23 gauge butterfly needle to right hand.  [name] tolerated venipuncture well and without incident; questions were answered and patient was discharged.

## 2014-12-02 NOTE — Progress Notes (Signed)
[name], MD [place] [address]  Malignant gastrointestinal stromal tumor (GIST) of other site - Plan: CBC with Differential, Comprehensive metabolic panel  CURRENT THERAPY: Gleevac 400 mg daily  INTERVAL HISTORY: [name] 53 y.o. male returns for followup of Stage IV GIST.  He has been followed by [facility] and [facility] in the past for this, but complications associated with his pain medication led to him being seen by [place].  I personally reviewed and went over laboratory results with the patient.  The results are noted within this dictation.  His labs are stable.  I reviewed his drug screens with him over the past 3 months.  He has always tested negative for opiates, despite [place] prescribing MS contin and Oxycodone.  In September, he again tested negative for opiates, but tested positive for cocaine.  When confronted, he became red in the face and explained everything.  He admits to 2 lines of cocaine prior to drug screen when he was with some friends.  He relays a story to me about his pain medications being stolen 3 weeks prior to the September drug screen.  However, he is unable to acceptably explain why he has tested negative for opiate on 2 separate occassions 1 month apart while being prescribed pain medications monthly.  He continued with a story about how depressed and lonely he is.  He tried to explain himself to me by repeatedly degrading himself.  "I would like to speak with Ms. Penland about this."  I have deferred this and explained that she and I both discussed the situation.  It is well documented in his chart about concerns with abuse and in September, Dr. Donald Pore note is very clear that we are done with pain medication prescribing after receiving his September urine drug screen after he was discharged from the clinic for his follow-up visit.  He was given the benefit of the doubt and unfortunately, he was unable to maintain the  standards we have set for him with regards to his pain medications.  He is tolerating Gleevac well.  Past Medical History  Diagnosis Date  . GIST (gastrointestinal stroma tumor), malignant, colon ([place])   . Alcohol abuse   . Ventral hernia   . PE (physical exam), annual   . Tobacco abuse   . Anemia   . GERD (gastroesophageal reflux disease)   . Hx of bipolar disorder   . Pancreatitis   . Malignant GIST ([place]) 09/23/2014    has Malignant GIST ([place]) on his problem list.     has No Known Allergies.  Current Outpatient Prescriptions on File Prior to Visit  Medication Sig Dispense Refill  . Aspirin-Caffeine 845-65 MG PACK Take 1 packet by mouth as needed.    . busPIRone (BUSPAR) 5 MG tablet Take 1 tablet (5 mg total) by mouth 3 (three) times daily. 90 tablet 2  . imatinib (GLEEVEC) 400 MG tablet Take 1 tablet (400 mg total) by mouth daily. 30 tablet 2  . mirtazapine (REMERON) 45 MG tablet Take 1 tablet (45 mg total) by mouth at bedtime as needed and may repeat dose one time if needed. 30 tablet 2  . morphine (MS CONTIN) 60 MG 12 hr tablet Take 1 tablet (60 mg total) by mouth every 12 (twelve) hours. 60 tablet 0  . omeprazole (PRILOSEC) 40 MG capsule Take 1 capsule (40 mg total) by mouth daily. 30 capsule 2  . Oxycodone HCl  10 MG TABS Take 1 tablet (10 mg total) by mouth every 4 (four) hours as needed. 60 tablet 0  . prochlorperazine (COMPAZINE) 10 MG tablet Take 10 mg by mouth as needed.    . simethicone (MYLICON) 622 MG chewable tablet Chew 125 mg by mouth as needed.     No current facility-administered medications on file prior to visit.    Past Surgical History  Procedure Laterality Date  . Colectomy    . Hernia repair      Denies any headaches, dizziness, double vision, fevers, chills, night sweats, nausea, vomiting, diarrhea, constipation, chest pain, heart palpitations, shortness of breath, blood in stool, black tarry stool, urinary pain, urinary burning, urinary frequency,  hematuria.   PHYSICAL EXAMINATION  ECOG PERFORMANCE STATUS: 1 - Symptomatic but completely ambulatory  Filed Vitals:   12/02/14 1401  BP: 118/76  Pulse: 86  Temp: 98.6 F (37 C)  Resp: 18    GENERAL:alert, no distress, comfortable, cooperative, smiling and mentally handicapped in some capacity, 30 minutes late for his appointment, and unaccompanied. SKIN: skin color, texture, turgor are normal, no rashes or significant lesions HEAD: Normocephalic, No masses, lesions, tenderness or abnormalities EYES: normal, PERRLA, EOMI, Conjunctiva are pink and non-injected EARS: External ears normal OROPHARYNX:lips, buccal mucosa, and tongue normal and mucous membranes are moist  NECK: supple, trachea midline LYMPH:  no palpable lymphadenopathy BREAST:not examined LUNGS: clear to auscultation  HEART: regular rate & rhythm, no murmurs and no gallops ABDOMEN:patient refused abdominal exam due to pain. BACK: Back symmetric, no curvature. EXTREMITIES:less then 2 second capillary refill, no joint deformities, effusion, or inflammation, no skin discoloration  NEURO: alert & oriented x 3 with fluent speech, no focal motor/sensory deficits, gait normal   LABORATORY DATA: CBC    Component Value Date/Time   WBC 6.5 12/02/2014 1414   RBC 3.41* 12/02/2014 1414   HGB 12.1* 12/02/2014 1414   HCT 34.7* 12/02/2014 1414   PLT 127* 12/02/2014 1414   MCV 101.8* 12/02/2014 1414   MCH 35.5* 12/02/2014 1414   MCHC 34.9 12/02/2014 1414   RDW 13.8 12/02/2014 1414   LYMPHSABS 2.3 12/02/2014 1414   MONOABS 0.6 12/02/2014 1414   EOSABS 0.1 12/02/2014 1414   BASOSABS 0.0 12/02/2014 1414      Chemistry      Component Value Date/Time   NA 129* 12/02/2014 1414   K 3.6 12/02/2014 1414   CL 97* 12/02/2014 1414   CO2 21* 12/02/2014 1414   BUN <5* 12/02/2014 1414   CREATININE 0.82 12/02/2014 1414      Component Value Date/Time   CALCIUM 7.9* 12/02/2014 1414   ALKPHOS 86 12/02/2014 1414   AST 59*  12/02/2014 1414   ALT 35 12/02/2014 1414   BILITOT 0.3 12/02/2014 1414      Drugs of Abuse     Component Value Date/Time   LABOPIA NONE DETECTED 12/02/2014 1412   COCAINSCRNUR NONE DETECTED 12/02/2014 1412   LABBENZ NONE DETECTED 12/02/2014 1412   AMPHETMU NONE DETECTED 12/02/2014 1412   THCU NONE DETECTED 12/02/2014 1412   LABBARB NONE DETECTED 12/02/2014 1412      PENDING LABS:   RADIOGRAPHIC STUDIES:  No results found.   PATHOLOGY:    ASSESSMENT AND PLAN:  Malignant GIST Stage IV GIST, on Gleevac 400 mg daily.  Chart is reviewed.  Despite three months worth of pain medication prescribing, including Oxycodone and MS contin, he has tested negative for opiates on all three occassions and in September, his urine drug  screen was positive for cocaine.  With this information, security was contacted to be on the floor during the patient's office visit, and [name] was advised that he will no longer receive pain medications from our clinic.  He tried his best to explain, but his explanation were inadequate.  I have offered a referral to a pain clinic, he declines.  His urine drug screen again today is negative for opiates.  These results were not available to me at the time of the patient's office visit: Drugs of Abuse     Component Value Date/Time   LABOPIA NONE DETECTED 12/02/2014 [place] 12/02/2014 1412   LABBENZ NONE DETECTED 12/02/2014 1412   AMPHETMU NONE DETECTED 12/02/2014 [place] 12/02/2014 [place] DETECTED 12/02/2014 1412     He has been discharged from [facility] for similar issues related to his pain medication.  Labs in 2 months: CBC diff, CMET  Return in 2 months.  No pain medication prescriptions will be filled by [facility].    THERAPY PLAN:  Continue daily Gleevac.  All questions were answered. The patient knows to call the clinic with any problems, questions  or concerns. We can certainly see the patient much sooner if necessary.  Patient and plan discussed with Dr. Ancil Linsey and she is in agreement with the aforementioned.   This note is electronically signed by: Doy Mince 12/02/2014 5:26 PM

## 2014-12-02 NOTE — Assessment & Plan Note (Signed)
Stage IV GIST, on Gleevac 400 mg daily.  Chart is reviewed.  Despite three months worth of pain medication prescribing, including Oxycodone and MS contin, he has tested negative for opiates on all three occassions and in September, his urine drug screen was positive for cocaine.  With this information, security was contacted to be on the floor during the patient's office visit, and [name] was advised that he will no longer receive pain medications from our clinic.  He tried his best to explain, but his explanation were inadequate.  I have offered a referral to a pain clinic, he declines.  His urine drug screen again today is negative for opiates.  These results were not available to me at the time of the patient's office visit: Drugs of Abuse     Component Value Date/Time   LABOPIA NONE DETECTED 12/02/2014 [place] 12/02/2014 1412   LABBENZ NONE DETECTED 12/02/2014 1412   AMPHETMU NONE DETECTED 12/02/2014 [place] 12/02/2014 [place] DETECTED 12/02/2014 1412     He has been discharged from [facility] for similar issues related to his pain medication.  Labs in 2 months: CBC diff, CMET  Return in 2 months.  No pain medication prescriptions will be filled by [facility].

## 2014-12-16 ENCOUNTER — Ambulatory Visit: Payer: Medicaid Other | Admitting: Nurse Practitioner

## 2014-12-27 ENCOUNTER — Other Ambulatory Visit (HOSPITAL_COMMUNITY): Payer: Self-pay | Admitting: Oncology

## 2014-12-27 DIAGNOSIS — C49A9 Gastrointestinal stromal tumor of other sites: Secondary | ICD-10-CM

## 2014-12-27 MED ORDER — IMATINIB MESYLATE 400 MG PO TABS
400.0000 mg | ORAL_TABLET | Freq: Every day | ORAL | Status: DC
Start: 2014-12-27 — End: 2015-03-28

## 2014-12-31 ENCOUNTER — Other Ambulatory Visit (HOSPITAL_COMMUNITY): Payer: Self-pay

## 2014-12-31 DIAGNOSIS — C49A0 Gastrointestinal stromal tumor, unspecified site: Secondary | ICD-10-CM

## 2014-12-31 MED ORDER — BUSPIRONE HCL 5 MG PO TABS: 5.0000 mg | ORAL_TABLET | Freq: Three times a day (TID) | ORAL | Status: DC

## 2014-12-31 MED ORDER — MIRTAZAPINE 45 MG PO TABS: 45.0000 mg | ORAL_TABLET | Freq: Every evening | ORAL | Status: DC | PRN

## 2014-12-31 MED ORDER — OMEPRAZOLE 40 MG PO CPDR: 40.0000 mg | DELAYED_RELEASE_CAPSULE | Freq: Every day | ORAL | Status: DC

## 2015-01-04 ENCOUNTER — Ambulatory Visit: Payer: Medicaid Other | Admitting: Nurse Practitioner

## 2015-02-02 ENCOUNTER — Other Ambulatory Visit (HOSPITAL_COMMUNITY): Payer: Self-pay | Admitting: Oncology

## 2015-02-02 ENCOUNTER — Encounter (HOSPITAL_COMMUNITY): Payer: Medicaid Other | Attending: Oncology | Admitting: Hematology & Oncology

## 2015-02-02 ENCOUNTER — Encounter (HOSPITAL_COMMUNITY): Payer: Self-pay | Admitting: Hematology & Oncology

## 2015-02-02 ENCOUNTER — Other Ambulatory Visit (HOSPITAL_COMMUNITY): Payer: Self-pay | Admitting: Emergency Medicine

## 2015-02-02 ENCOUNTER — Encounter (HOSPITAL_BASED_OUTPATIENT_CLINIC_OR_DEPARTMENT_OTHER): Payer: Medicaid Other

## 2015-02-02 VITALS — BP 135/87 | HR 69 | Resp 18 | Wt 186.5 lb

## 2015-02-02 DIAGNOSIS — C49A3 Gastrointestinal stromal tumor of small intestine: Secondary | ICD-10-CM | POA: Diagnosis not present

## 2015-02-02 DIAGNOSIS — C49A0 Gastrointestinal stromal tumor, unspecified site: Secondary | ICD-10-CM

## 2015-02-02 DIAGNOSIS — C49A9 Gastrointestinal stromal tumor of other sites: Secondary | ICD-10-CM | POA: Insufficient documentation

## 2015-02-02 LAB — CBC WITH DIFFERENTIAL/PLATELET
BASOS PCT: 1 %
Basophils Absolute: 0.1 10*3/uL (ref 0.0–0.1)
EOS PCT: 2 %
Eosinophils Absolute: 0.2 10*3/uL (ref 0.0–0.7)
HEMATOCRIT: 39.8 % (ref 39.0–52.0)
HEMOGLOBIN: 13.5 g/dL (ref 13.0–17.0)
LYMPHS ABS: 1.6 10*3/uL (ref 0.7–4.0)
Lymphocytes Relative: 25 %
MCH: 35.8 pg — ABNORMAL HIGH (ref 26.0–34.0)
MCHC: 33.9 g/dL (ref 30.0–36.0)
MCV: 105.6 fL — AB (ref 78.0–100.0)
Monocytes Absolute: 0.6 10*3/uL (ref 0.1–1.0)
Monocytes Relative: 9 %
NEUTROS PCT: 63 %
Neutro Abs: 4.2 10*3/uL (ref 1.7–7.7)
Platelets: 168 10*3/uL (ref 150–400)
RBC: 3.77 MIL/uL — ABNORMAL LOW (ref 4.22–5.81)
RDW: 13.8 % (ref 11.5–15.5)
WBC: 6.6 10*3/uL (ref 4.0–10.5)

## 2015-02-02 LAB — COMPREHENSIVE METABOLIC PANEL
ALBUMIN: 3.6 g/dL (ref 3.5–5.0)
ALK PHOS: 73 U/L (ref 38–126)
ALT: 23 U/L (ref 17–63)
AST: 31 U/L (ref 15–41)
Anion gap: 8 (ref 5–15)
BILIRUBIN TOTAL: 0.7 mg/dL (ref 0.3–1.2)
BUN: 7 mg/dL (ref 6–20)
CHLORIDE: 102 mmol/L (ref 101–111)
CO2: 28 mmol/L (ref 22–32)
CREATININE: 1.04 mg/dL (ref 0.61–1.24)
Calcium: 8.1 mg/dL — ABNORMAL LOW (ref 8.9–10.3)
Glucose, Bld: 99 mg/dL (ref 65–99)
Potassium: 3.2 mmol/L — ABNORMAL LOW (ref 3.5–5.1)
Sodium: 138 mmol/L (ref 135–145)
Total Protein: 6.9 g/dL (ref 6.5–8.1)

## 2015-02-02 MED ORDER — BUSPIRONE HCL 5 MG PO TABS: 5.0000 mg | ORAL_TABLET | Freq: Three times a day (TID) | ORAL | Status: DC

## 2015-02-02 MED ORDER — MIRTAZAPINE 45 MG PO TABS: 45.0000 mg | ORAL_TABLET | Freq: Every evening | ORAL | Status: DC | PRN

## 2015-02-02 MED ORDER — OMEPRAZOLE 40 MG PO CPDR: 40.0000 mg | DELAYED_RELEASE_CAPSULE | Freq: Every day | ORAL | Status: DC

## 2015-02-02 MED ORDER — POTASSIUM CHLORIDE CRYS ER 20 MEQ PO TBCR: 20.0000 meq | EXTENDED_RELEASE_TABLET | Freq: Two times a day (BID) | ORAL | Status: DC

## 2015-02-02 NOTE — Progress Notes (Signed)
[facility] NOTE  Patient Care Team: [name], MD as PCP - General (Internal Medicine) Daneil Dolin, MD as Consulting Physician (Gastroenterology)  CHIEF COMPLAINTS/PURPOSE OF CONSULTATION:  GI stromal tumor diagnosed in 2014 S/p partial small bowel resection 09/04/2012 Gleevec started 04/13/2013 Seen at [facility] Patient has a history of alcohol and cocaine abuse History of medical noncompliance with Gleevec Non compliance with opiods POSITIVE cocaine detected on UDS 12/02/2014, no opiods   HISTORY OF PRESENTING ILLNESS:  [name] 54 y.o. 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 He has been followed at [facility] and previously at [facility].    [name] returns to the [facility] alone today.  He states that he continues to take his Gleevec daily. He says he's recently had difficulty with his veins being "hard," or difficult to find, he thinks it is due to the [place].  In terms of substances, [name] says he hasn't had anything to drink in about two weeks, and hasn't smoked a cigarette in over a week.  He confirms that he stays terribly bored at home. He says he "sits there in his house from the time he gets up to the time he goes to bed." He says he knows this isn't healthy, but he has nothing to do. He says it's difficult since all of his family has moved away, and he doesn't have any transportation. He remarks that he did get out for Thanksgiving and went to visit his sister in [place]. He's trying to find alternative living options so he can get closer to loved ones. He says his closest kin are in [place].  He ultimately says he has no life, and that he hates getting up in the mornings.  [name] also remarks that he's had a cold for a little over a week, which makes him have "something" in his throat. "Some kind of phlegm." He denies any fever.  His stomach is very sore on his right side.  He has never had a flu  shot.  MEDICAL HISTORY:  Past Medical History  Diagnosis Date  . GIST (gastrointestinal stroma tumor), malignant, colon ([place])   . Alcohol abuse   . Ventral hernia   . PE (physical exam), annual   . Tobacco abuse   . Anemia   . GERD (gastroesophageal reflux disease)   . Hx of bipolar disorder   . Pancreatitis   . Malignant GIST ([place]) 09/23/2014    SURGICAL HISTORY: Past Surgical History  Procedure Laterality Date  . Colectomy    . Hernia repair      SOCIAL HISTORY: Social History   Social History  . Marital Status: Single    Spouse Name: [name]  . Number of Children: [name]  . Years of Education: [name]   Occupational History  . Not on file.   Social History Main Topics  . Smoking status: Current Every Day Smoker -- 1.00 packs/day for 36 years    Types: Cigarettes  . Smokeless tobacco: Never Used  . Alcohol Use: 1.2 - 1.8 oz/week    2-3 Cans of beer per week     Comment: 2 - 3 cans beer daily  . Drug Use: No  . Sexual Activity: Not Currently   Other Topics Concern  . Not on file   Social History Narrative  Divorced. 1 son, 72 yo, who lives in [place] 1 grandchild in [place] he has never met. Smoker, used to smoke 3  ppd and now down to less than 1 ppd. Started around 50 or 54 yo Ex cocaine user, says he hasn't used "in a pretty good while" ETOH, he doesn't drink liquor or  go to bars anymore, he now drinks 2 cheap beers a day. He drank liquor heavily for about 10 years.  FAMILY HISTORY: Family History  Problem Relation Age of Onset  . Cancer Mother   . Hypertension Father   . Cancer Brother    indicated that his mother is alive. He indicated that his father is alive. He indicated that his brother is deceased.  Mother living about 38 yo, breast cancer. Father living about 32 yo, had a triple bypass about one year ago. 1 sister recently moved to [place], she is healthy 1 brother died from cancer at 37 yo, "something in his spine that went to his brain" 1  brother, healthy  ALLERGIES:  has No Known Allergies.  MEDICATIONS:  Current Outpatient Prescriptions  Medication Sig Dispense Refill  . busPIRone (BUSPAR) 5 MG tablet Take 1 tablet (5 mg total) by mouth 3 (three) times daily. 90 tablet 1  . imatinib (GLEEVEC) 400 MG tablet Take 1 tablet (400 mg total) by mouth daily. 30 tablet 2  . mirtazapine (REMERON) 45 MG tablet Take 1 tablet (45 mg total) by mouth at bedtime as needed and may repeat dose one time if needed. 30 tablet 1  . omeprazole (PRILOSEC) 40 MG capsule Take 1 capsule (40 mg total) by mouth daily. 30 capsule 1  . prochlorperazine (COMPAZINE) 10 MG tablet Take 10 mg by mouth as needed.    . Aspirin-Caffeine 845-65 MG PACK Take 1 packet by mouth as needed.    . simethicone (MYLICON) 413 MG chewable tablet Chew 125 mg by mouth as needed. Reported on 02/02/2015     No current facility-administered medications for this visit.    Review of Systems  Constitutional: Positive for malaise/fatigue.  Gastrointestinal: Positive for abdominal pain. Negative for constipation.       Acid reflux.  Musculoskeletal: Positive for falls.  Psychiatric/Behavioral: Positive for substance abuse. The patient has insomnia.    14 point ROS was done and is otherwise as detailed above or in HPI   PHYSICAL EXAMINATION: ECOG PERFORMANCE STATUS: 1 - Symptomatic but completely ambulatory  Filed Vitals:   02/02/15 1004  BP: 135/87  Pulse: 69  Resp: 18   Filed Weights   02/02/15 1004  Weight: 186 lb 8 oz (84.596 kg)     Physical Exam  Constitutional: He is oriented to person, place, and time and well-developed, well-nourished, and in no distress.  Edentulous. HENT:  Head: Normocephalic and atraumatic.  Nose: Nose normal.  Mouth/Throat: Oropharynx is clear and moist. No oropharyngeal exudate.  Eyes: Conjunctivae and EOM are normal. Pupils are equal, round, and reactive to light. Right eye exhibits no discharge. Left eye exhibits no discharge.  No scleral icterus.  Neck: Normal range of motion. Neck supple. No tracheal deviation present. No thyromegaly present.  Cardiovascular: Normal rate, regular rhythm and normal heart sounds.  Exam reveals no gallop and no friction rub.   No murmur heard. Pulmonary/Chest: Effort normal and breath sounds normal. He has no wheezes. He has no rales.  Abdominal: Soft. Bowel sounds are normal. He exhibits no distension and no mass. There is tenderness. There is no rebound and no guarding.  Large incisional Hernia present. Multiple surgical scars  Musculoskeletal: Normal range of motion. He exhibits no edema.  Lymphadenopathy:  He has no cervical adenopathy.  Neurological: He is alert and oriented to person, place, and time. He has normal reflexes. No cranial nerve deficit. Gait normal. Coordination normal.  Skin: Skin is warm and dry. No rash noted.  Psychiatric: Mood, memory, affect and judgment normal.  Nursing note and vitals reviewed.   LABORATORY DATA:  I have reviewed the data as listed  CBC    Component Value Date/Time   WBC 6.6 02/02/2015 0910   RBC 3.77* 02/02/2015 0910   HGB 13.5 02/02/2015 0910   HCT 39.8 02/02/2015 0910   PLT 168 02/02/2015 0910   MCV 105.6* 02/02/2015 0910   MCH 35.8* 02/02/2015 0910   MCHC 33.9 02/02/2015 0910   RDW 13.8 02/02/2015 0910   LYMPHSABS 1.6 02/02/2015 0910   MONOABS 0.6 02/02/2015 0910   EOSABS 0.2 02/02/2015 0910   BASOSABS 0.1 02/02/2015 0910    CMP     Component Value Date/Time   NA 138 02/02/2015 0910   K 3.2* 02/02/2015 0910   CL 102 02/02/2015 0910   CO2 28 02/02/2015 0910   GLUCOSE 99 02/02/2015 0910   BUN 7 02/02/2015 0910   CREATININE 1.04 02/02/2015 0910   CALCIUM 8.1* 02/02/2015 0910   PROT 6.9 02/02/2015 0910   ALBUMIN 3.6 02/02/2015 0910   AST 31 02/02/2015 0910   ALT 23 02/02/2015 0910   ALKPHOS 73 02/02/2015 0910   BILITOT 0.7 02/02/2015 0910   GFRNONAA >60 02/02/2015 0910   GFRAA >60 02/02/2015 0910   Urine  rapid drug screen (hosp performed)  Status: Finalresult Visible to patient:  Not Released Nextappt: 11/09/2014 at 11:00 AM in Oncology (KEFALAS,THOMAS, PA-C)           Notes Recorded by Patrici Ranks, MD on 11/04/2014 at 10:51 AM Yuuki's etoh level this am.... Also positive cocaine UDS. Dr.P     Ref Range 9:14 AM    Opiates NONE DETECTED  NONE DETECTED   Cocaine NONE DETECTED  POSITIVE (A)   Benzodiazepines NONE DETECTED  NONE DETECTED   Amphetamines NONE DETECTED  NONE DETECTED   Tetrahydrocannabinol NONE DETECTED  NONE DETECTED   Barbiturates NONE DETECTED  NONE DETECTED   Comments:     DRUG SCREEN FOR MEDICAL PURPOSES  ONLY. IF CONFIRMATION IS NEEDED  FOR ANY PURPOSE, NOTIFY LAB  WITHIN 5 DAYS.      LOWEST DETECTABLE LIMITS  FOR URINE DRUG SCREEN  Drug Class    Cutoff (ng/mL)  Amphetamine   1000  Barbiturate   200  Benzodiazepine  627  Tricyclics    035  Opiates     300  Cocaine     300  THC       50     Resulting Agency [facility]       Specimen Collected: 11/04/14 9:14 AM   Last Resulted: 11/04/14 10:11 AM           RADIOGRAPHIC STUDIES: I have personally reviewed the radiological images as listed and agreed with the findings in the report. Imaging reviewed per [facility] and care everywhere CLINICAL DATA: GI stromal tumor, malignant. Colonic primary. Tobacco abuse. Alcohol abuse. Gastroesophageal reflux disease. History of pancreatitis. Smoker.  EXAM: CT CHEST, ABDOMEN, AND PELVIS WITH CONTRAST  TECHNIQUE: Multidetector CT imaging of the chest, abdomen and pelvis was performed following the standard protocol during bolus administration of intravenous contrast.  CONTRAST: 155m OMNIPAQUE IOHEXOL 300 MG/ML SOLN  COMPARISON: Abdominal pelvic CT of 03/04/2010. No prior chest CT. Chest  radiograph of 12/01/2007 is reviewed. IMPRESSION: 1. Interval development of  omental/peritoneal metastasis, including a dominant left abdominal 2.6 cm pericolonic nodule. 2. No acute process or evidence of metastatic disease in the chest. 3. Age advanced coronary artery atherosclerosis. Recommend assessment of coronary risk factors and consideration of medical therapy. 4. Abdominal wall laxity containing nonobstructive transverse colon. Fat containing ventral abdominal wall hernias. 5. Mild motion degradation within the upper abdomen. 6. Emphysema.   Electronically Signed  By: Abigail Miyamoto M.D.  On: 08/25/2014 11:41  FILMS WERE TAKEN TO RADIOLOGY FROM [facility] ON CD AND REVIEWED WITH THE RADIOLOGIST. DISEASE IS STABLE TO SLIGHTLY IMPROVED.  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 He insists he has been compliant over the past few months.I have encouraged ongoing compliance. We [facility] to the radiologist for comparison and they were stable to slightly improved. The images will have to be imported into Epic for an addendum report to be filed. He will be due for CT scans again and we will arrange for these. He will be notified of the results.   Unfortunately, his urine drug screen revealed cocaine and alcohol. He no longer receives any narcotics from [place]. Suprisingly, he has not given me any difficulty over this issue.  I have encouraged him to try to move closer to family as he notes that he gets out more and is more "involved with life" around them  Plan as detailed above. Note that the patient states he is compliant with his Gleevec, at this point I am not convinced he is always compliant.   I will order CT  scans for him today. He will also have labs drawn when he returns. His medicine is already called in to [facility] drug.  We will follow-up with him in 3 months.  Orders Placed This Encounter  Procedures  . CT Abdomen W Contrast    Standing Status: Future     Number of Occurrences:      Standing Expiration Date: 02/02/2016    Order Specific Question:  If indicated for the ordered procedure, I authorize the administration of contrast media per Radiology protocol    Answer:  Yes    Order Specific Question:  Reason for Exam (SYMPTOM  OR DIAGNOSIS REQUIRED)    Answer:  stage IV GIST    Order Specific Question:  Preferred imaging location?    Answer:  [facility]  . CT Chest W Contrast    Standing Status: Future     Number of Occurrences:      Standing Expiration Date: 02/02/2016    Order Specific Question:  If indicated for the ordered procedure, I authorize the administration of contrast media per Radiology protocol    Answer:  Yes    Order Specific Question:  Reason for Exam (SYMPTOM  OR DIAGNOSIS REQUIRED)    Answer:  stage IV GIST    Order Specific Question:  Preferred imaging location?    Answer:  [facility]   All questions were answered. The patient knows to call the clinic with any problems, questions or concerns.   This document serves as a record of services personally performed by Larene Beach  Kutter Schnepf, MD. It was created on her behalf by Toni Amend, a trained medical scribe. The creation of this record is based on the scribe's personal observations and the provider's statements to them. This document has been checked and approved by the attending provider.  I have reviewed the above documentation for accuracy and completeness, and I agree with the above.  This note was electronically signed.  Kelby Fam. Whitney Muse, MD

## 2015-02-02 NOTE — Progress Notes (Signed)
[name] presented for labwork. Labs per MD order drawn via Peripheral Line 23 gauge needle inserted in left forearm  Good blood return present. Procedure without incident.  Needle removed intact. Patient tolerated procedure well.

## 2015-02-02 NOTE — Progress Notes (Unsigned)
Talked with Dr Whitney Muse, we are going to put in a referral for port placement with Dr Arnoldo Morale.  Spoke with pt potassium was low.  Kdur escribed to his pharmacy, verbalized understanding.  Also refilled buspar, remeron, and prilosec.

## 2015-02-02 NOTE — Patient Instructions (Addendum)
[facility] at [facility] Discharge Instructions  RECOMMENDATIONS MADE BY THE CONSULTANT AND ANY TEST RESULTS WILL BE SENT TO YOUR REFERRING PHYSICIAN.  Exam completed by Dr Whitney Muse today Continue taking Gleevec as prescribed At a later point we may talk about getting a port-a-cath placed, this goes right under your skin This will allow [place] to collect blood more easily  CT scans ordered Return to see the doctor in after your scans Please call the clinic if you have any questions or concerns    Thank you for choosing [place] at [facility] to provide your oncology and hematology care.  To afford each patient quality time with our provider, please arrive at least 15 minutes before your scheduled appointment time.    You need to re-schedule your appointment should you arrive 10 or more minutes late.  We strive to give you quality time with our providers, and arriving late affects you and other patients whose appointments are after yours.  Also, if you no show three or more times for appointments you may be dismissed from the clinic at the providers discretion.     Again, thank you for choosing [facility].  Our hope is that these requests will decrease the amount of time that you wait before being seen by our physicians.       _____________________________________________________________  Should you have questions after your visit to [facility], please contact our office at [phone] between the hours of 8:30 a.m. and 4:30 p.m.  Voicemails left after 4:30 p.m. will not be returned until the following business day.  For prescription refill requests, have your pharmacy contact our office.

## 2015-02-09 ENCOUNTER — Other Ambulatory Visit (HOSPITAL_COMMUNITY): Payer: Medicaid Other

## 2015-02-09 ENCOUNTER — Other Ambulatory Visit (HOSPITAL_COMMUNITY): Payer: Self-pay | Admitting: Oncology

## 2015-02-09 DIAGNOSIS — C49A3 Gastrointestinal stromal tumor of small intestine: Secondary | ICD-10-CM

## 2015-02-10 ENCOUNTER — Ambulatory Visit (HOSPITAL_COMMUNITY)
Admission: RE | Admit: 2015-02-10 | Discharge: 2015-02-10 | Disposition: A | Discharge status: Home / Self Care | Payer: Medicaid Other | Source: Ambulatory Visit | Attending: Hematology & Oncology | Admitting: Hematology & Oncology

## 2015-02-10 ENCOUNTER — Encounter (HOSPITAL_COMMUNITY): Payer: Self-pay | Admitting: Lab

## 2015-02-10 ENCOUNTER — Other Ambulatory Visit (HOSPITAL_COMMUNITY): Payer: Self-pay | Admitting: Hematology & Oncology

## 2015-02-10 DIAGNOSIS — Z9889 Other specified postprocedural states: Secondary | ICD-10-CM | POA: Diagnosis not present

## 2015-02-10 DIAGNOSIS — C49A0 Gastrointestinal stromal tumor, unspecified site: Secondary | ICD-10-CM | POA: Diagnosis present

## 2015-02-10 DIAGNOSIS — K760 Fatty (change of) liver, not elsewhere classified: Secondary | ICD-10-CM | POA: Diagnosis not present

## 2015-02-10 DIAGNOSIS — J439 Emphysema, unspecified: Secondary | ICD-10-CM | POA: Diagnosis not present

## 2015-02-10 DIAGNOSIS — K439 Ventral hernia without obstruction or gangrene: Secondary | ICD-10-CM | POA: Diagnosis not present

## 2015-02-10 DIAGNOSIS — C49A3 Gastrointestinal stromal tumor of small intestine: Secondary | ICD-10-CM

## 2015-02-10 DIAGNOSIS — I2510 Atherosclerotic heart disease of native coronary artery without angina pectoris: Secondary | ICD-10-CM | POA: Diagnosis not present

## 2015-02-10 MED ORDER — IOHEXOL 300 MG/ML  SOLN
100.0000 mL | Freq: Once | INTRAMUSCULAR | Status: AC | PRN
  Administered 2015-02-10: 100 mL via INTRAVENOUS

## 2015-02-10 NOTE — Progress Notes (Signed)
Referral to Dr Arnoldo Morale for port.  Appt 1/19 @9 .  Records faxed on 12/29

## 2015-02-15 ENCOUNTER — Encounter (HOSPITAL_COMMUNITY): Payer: Self-pay | Admitting: Emergency Medicine

## 2015-02-15 ENCOUNTER — Other Ambulatory Visit (HOSPITAL_COMMUNITY): Payer: Medicaid Other

## 2015-02-15 ENCOUNTER — Ambulatory Visit (HOSPITAL_COMMUNITY): Payer: Medicaid Other | Admitting: Oncology

## 2015-02-15 ENCOUNTER — Emergency Department (HOSPITAL_COMMUNITY): Payer: Medicaid Other

## 2015-02-15 ENCOUNTER — Emergency Department (HOSPITAL_COMMUNITY)
Admission: EM | Admit: 2015-02-15 | Discharge: 2015-02-16 | Disposition: A | Discharge status: Home / Self Care | Payer: Medicaid Other | Attending: Emergency Medicine | Admitting: Emergency Medicine

## 2015-02-15 DIAGNOSIS — Z8659 Personal history of other mental and behavioral disorders: Secondary | ICD-10-CM | POA: Diagnosis not present

## 2015-02-15 DIAGNOSIS — F10920 Alcohol use, unspecified with intoxication, uncomplicated: Secondary | ICD-10-CM

## 2015-02-15 DIAGNOSIS — Z85038 Personal history of other malignant neoplasm of large intestine: Secondary | ICD-10-CM | POA: Insufficient documentation

## 2015-02-15 DIAGNOSIS — E876 Hypokalemia: Secondary | ICD-10-CM | POA: Diagnosis not present

## 2015-02-15 DIAGNOSIS — W1839XA Other fall on same level, initial encounter: Secondary | ICD-10-CM | POA: Diagnosis not present

## 2015-02-15 DIAGNOSIS — Y998 Other external cause status: Secondary | ICD-10-CM | POA: Diagnosis not present

## 2015-02-15 DIAGNOSIS — Y9389 Activity, other specified: Secondary | ICD-10-CM | POA: Diagnosis not present

## 2015-02-15 DIAGNOSIS — Z79899 Other long term (current) drug therapy: Secondary | ICD-10-CM | POA: Insufficient documentation

## 2015-02-15 DIAGNOSIS — K219 Gastro-esophageal reflux disease without esophagitis: Secondary | ICD-10-CM | POA: Diagnosis not present

## 2015-02-15 DIAGNOSIS — R61 Generalized hyperhidrosis: Secondary | ICD-10-CM | POA: Diagnosis not present

## 2015-02-15 DIAGNOSIS — F10120 Alcohol abuse with intoxication, uncomplicated: Secondary | ICD-10-CM | POA: Diagnosis not present

## 2015-02-15 DIAGNOSIS — Y9289 Other specified places as the place of occurrence of the external cause: Secondary | ICD-10-CM | POA: Insufficient documentation

## 2015-02-15 DIAGNOSIS — Z862 Personal history of diseases of the blood and blood-forming organs and certain disorders involving the immune mechanism: Secondary | ICD-10-CM | POA: Insufficient documentation

## 2015-02-15 DIAGNOSIS — Z043 Encounter for examination and observation following other accident: Secondary | ICD-10-CM | POA: Diagnosis present

## 2015-02-15 DIAGNOSIS — D696 Thrombocytopenia, unspecified: Secondary | ICD-10-CM | POA: Insufficient documentation

## 2015-02-15 DIAGNOSIS — F17210 Nicotine dependence, cigarettes, uncomplicated: Secondary | ICD-10-CM | POA: Diagnosis not present

## 2015-02-15 LAB — CBC
HCT: 37.3 % — ABNORMAL LOW (ref 39.0–52.0)
Hemoglobin: 13.4 g/dL (ref 13.0–17.0)
MCH: 36.1 pg — AB (ref 26.0–34.0)
MCHC: 35.9 g/dL (ref 30.0–36.0)
MCV: 100.5 fL — ABNORMAL HIGH (ref 78.0–100.0)
PLATELETS: 62 10*3/uL — AB (ref 150–400)
RBC: 3.71 MIL/uL — AB (ref 4.22–5.81)
RDW: 13.8 % (ref 11.5–15.5)
WBC: 6.1 10*3/uL (ref 4.0–10.5)

## 2015-02-15 LAB — COMPREHENSIVE METABOLIC PANEL
ALT: 94 U/L — ABNORMAL HIGH (ref 17–63)
ANION GAP: 14 (ref 5–15)
AST: 222 U/L — AB (ref 15–41)
Albumin: 3.9 g/dL (ref 3.5–5.0)
Alkaline Phosphatase: 88 U/L (ref 38–126)
BUN: 7 mg/dL (ref 6–20)
CO2: 26 mmol/L (ref 22–32)
CREATININE: 0.96 mg/dL (ref 0.61–1.24)
Calcium: 8.7 mg/dL — ABNORMAL LOW (ref 8.9–10.3)
Chloride: 96 mmol/L — ABNORMAL LOW (ref 101–111)
GFR calc Af Amer: >60 mL/min (ref >60)
GLUCOSE: 104 mg/dL — AB (ref 65–99)
Potassium: 3 mmol/L — ABNORMAL LOW (ref 3.5–5.1)
Sodium: 136 mmol/L (ref 135–145)
Total Bilirubin: 0.4 mg/dL (ref 0.3–1.2)
Total Protein: 7.4 g/dL (ref 6.5–8.1)

## 2015-02-15 LAB — URINALYSIS, ROUTINE W REFLEX MICROSCOPIC
Bilirubin Urine: NEGATIVE
Glucose, UA: NEGATIVE mg/dL
HGB URINE DIPSTICK: NEGATIVE
Ketones, ur: NEGATIVE mg/dL
Leukocytes, UA: NEGATIVE
NITRITE: NEGATIVE
PROTEIN: NEGATIVE mg/dL
Specific Gravity, Urine: <1.005 — ABNORMAL LOW (ref 1.005–1.030)
pH: 5.5 (ref 5.0–8.0)

## 2015-02-15 LAB — LIPASE, BLOOD: LIPASE: 29 U/L (ref 11–51)

## 2015-02-15 LAB — CBG MONITORING, ED: GLUCOSE-CAPILLARY: 111 mg/dL — AB (ref 65–99)

## 2015-02-15 LAB — ETHANOL: Alcohol, Ethyl (B): 261 mg/dL — ABNORMAL HIGH (ref <5)

## 2015-02-15 MED ORDER — POTASSIUM CHLORIDE CRYS ER 20 MEQ PO TBCR: 20.0000 meq | EXTENDED_RELEASE_TABLET | Freq: Two times a day (BID) | ORAL | Status: DC

## 2015-02-15 MED ORDER — SODIUM CHLORIDE 0.9 % IV BOLUS (SEPSIS)
1000.0000 mL | Freq: Once | INTRAVENOUS | Status: AC
  Administered 2015-02-15: 1000 mL via INTRAVENOUS

## 2015-02-15 MED ORDER — POTASSIUM CHLORIDE CRYS ER 20 MEQ PO TBCR
40.0000 meq | EXTENDED_RELEASE_TABLET | Freq: Once | ORAL | Status: AC
  Administered 2015-02-15: 40 meq via ORAL
  Filled 2015-02-15: qty 2

## 2015-02-15 NOTE — ED Notes (Signed)
Per EMS pt fell out of sitting position, he was drinking today and told EMS he used cocaine yesterday. Pt was ambulatory on scene per EMS, he has skin tear to left middle knuckle and right elbow. Pt is currently being treated for cancer.

## 2015-02-15 NOTE — ED Provider Notes (Signed)
CSN: [id number]     Arrival date & time 02/15/15  2107 History  By signing my name below, I, [name], attest that this documentation has been prepared under the direction and in the presence of Pattricia Boss, MD. Electronically Signed: [name], ED Scribe. 02/15/2015. 9:32 PM.    Chief Complaint  Patient presents with  . Loss of Consciousness  . Fall   Patient is a 55 y.o. male presenting with fall. The history is provided by the patient. No language interpreter was used.  Fall This is a new problem. The current episode started 1 to 2 hours ago. The problem occurs rarely. The problem has been resolved. Associated symptoms include shortness of breath. Nothing aggravates the symptoms. Nothing relieves the symptoms. He has tried nothing for the symptoms. The treatment provided no relief.    HPI Comments: [name] is a 55 y.o. 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 Pt states that he "blacked out" and hit his right elbow when he fell. No known head injury. He reports that he called the ambulance himself. Pt states that he drinks alcohol daily (24 beers per day), and has been drinking this evening as well. Pt also admits to using cocaine yesterday. He also notes that he has been having watery diarrhea and emesis for a week as well as SOB, cough, diaphoresis, chills. Pt is a current smoker, 1 ppd. Pt lives alone. He denies fever.     Past Medical History  Diagnosis Date  . GIST (gastrointestinal stroma tumor), malignant, colon ([place])   . Alcohol abuse   . Ventral hernia   . PE (physical exam), annual   . Tobacco abuse   . Anemia   . GERD (gastroesophageal reflux disease)   . Hx of bipolar disorder   . Pancreatitis   . Malignant GIST ([place]) 09/23/2014   Past Surgical History  Procedure  Laterality Date  . Colectomy    . Hernia repair     Family History  Problem Relation Age of Onset  . Cancer Mother   . Hypertension Father   . Cancer Brother    Social History  Substance Use Topics  . Smoking status: Current Every Day Smoker -- 1.00 packs/day for 36 years    Types: Cigarettes  . Smokeless tobacco: Never Used  . Alcohol Use: 14.4 oz/week    24 Cans of beer per week     Comment: 2 - 3 cans beer daily    Review of Systems  Constitutional: Positive for chills and diaphoresis. Negative for fever.  Respiratory: Positive for cough and shortness of breath.   Gastrointestinal: Positive for nausea, vomiting and diarrhea.  Musculoskeletal: Positive for arthralgias.  All other systems reviewed and are negative.  Allergies  Review of patient's allergies indicates no known allergies.  Home Medications   Prior to Admission medications   Medication Sig Start Date End Date Taking? Authorizing Provider  Aspirin-Caffeine 845-65 MG PACK Take 1 packet by mouth as needed.    Historical Provider, MD  busPIRone (BUSPAR) 5 MG tablet Take 1 tablet (5 mg total) by mouth 3 (three) times daily. 02/02/15   Patrici Ranks, MD  imatinib (GLEEVEC) 400 MG tablet Take 1 tablet (400 mg total) by mouth daily. 12/27/14   Baird Cancer, PA-C  mirtazapine (REMERON) 45 MG  tablet Take 1 tablet (45 mg total) by mouth at bedtime as needed and may repeat dose one time if needed. 02/02/15   Patrici Ranks, MD  omeprazole (PRILOSEC) 40 MG capsule Take 1 capsule (40 mg total) by mouth daily. 02/02/15   Patrici Ranks, MD  potassium chloride SA (K-DUR,KLOR-CON) 20 MEQ tablet Take 1 tablet (20 mEq total) by mouth 2 (two) times daily. 02/02/15   Baird Cancer, PA-C  prochlorperazine (COMPAZINE) 10 MG tablet Take 10 mg by mouth as needed. 06/25/14   Historical Provider, MD  simethicone (MYLICON) 0000000 MG chewable tablet Chew 125 mg by mouth as needed. Reported on 02/02/2015    Historical Provider,  MD   BP 147/89 mmHg  Pulse 94  Temp(Src) 97.5 F (36.4 C) (Oral)  Resp 18  Ht 6\' 1"  (1.854 m)  Wt 194 lb (87.998 kg)  BMI 25.60 kg/m2  SpO2 97% Physical Exam  Constitutional: He is oriented to person, place, and time. He appears well-developed and well-nourished.  HENT:  Head: Normocephalic and atraumatic.  Right Ear: External ear normal.  Left Ear: External ear normal.  Nose: Nose normal.  Mouth/Throat: Oropharynx is clear and moist.  Eyes: Conjunctivae and EOM are normal. Pupils are equal, round, and reactive to light.  Neck: Normal range of motion. Neck supple.  Cardiovascular: Normal rate, regular rhythm, normal heart sounds and intact distal pulses.   Pulmonary/Chest: Effort normal and breath sounds normal. No respiratory distress. He has no wheezes. He exhibits no tenderness.  Abdominal: Soft. Bowel sounds are normal. He exhibits no distension and no mass. There is no tenderness. There is no guarding.  Musculoskeletal: Normal range of motion.  Neurological: He is alert and oriented to person, place, and time. He has normal reflexes. He exhibits normal muscle tone. Coordination normal.  Skin: Skin is warm and dry.  Psychiatric: He has a normal mood and affect. His behavior is normal. Judgment and thought content normal.  Nursing note and vitals reviewed.   ED Course  Procedures (including critical care time) DIAGNOSTIC STUDIES: Oxygen Saturation is 97% on RA, normal by my interpretation.    COORDINATION OF CARE: 9:30 PM Discussed treatment plan with pt at bedside and pt agreed to plan.   Labs Review Labs Reviewed  CBC - Abnormal; Notable for the following:    RBC 3.71 (*)    HCT 37.3 (*)    MCV 100.5 (*)    MCH 36.1 (*)    Platelets 62 (*)    All other components within normal limits  URINALYSIS, ROUTINE W REFLEX MICROSCOPIC (NOT AT [facility]) - Abnormal; Notable for the following:    Color, Urine STRAW (*)    Specific Gravity, Urine <1.005 (*)    All other  components within normal limits  COMPREHENSIVE METABOLIC PANEL - Abnormal; Notable for the following:    Potassium 3.0 (*)    Chloride 96 (*)    Glucose, Bld 104 (*)    Calcium 8.7 (*)    AST 222 (*)    ALT 94 (*)    All other components within normal limits  ETHANOL - Abnormal; Notable for the following:    Alcohol, Ethyl (B) 261 (*)    All other components within normal limits  CBG MONITORING, ED - Abnormal; Notable for the following:    Glucose-Capillary 111 (*)    All other components within normal limits  LIPASE, BLOOD    Imaging Review Dg Chest 2 View  02/15/2015  CLINICAL DATA:  Generalized weakness. EXAM: CHEST  2 VIEW COMPARISON:  Chest CT 02/10/2015 FINDINGS: Normal cardiac silhouette. There is pulmonary scarring inferior to the hilum not changed from prior. No effusion, infiltrate, pneumothorax. No acute osseous abnormality. Lungs are hyperinflated. IMPRESSION: Hyperinflated lungs.  No acute findings. Electronically Signed   By: Suzy Bouchard M.D.   On: 02/15/2015 23:14   Ct Head Wo Contrast  02/15/2015  CLINICAL DATA:  Weakness.  Fall from standing this evening. EXAM: CT HEAD WITHOUT CONTRAST TECHNIQUE: Contiguous axial images were obtained from the base of the skull through the vertex without intravenous contrast. COMPARISON:  None. FINDINGS: No intracranial hemorrhage, mass effect, or midline shift. No hydrocephalus. The basilar cisterns are patent. No evidence of territorial infarct. Mild atrophy and chronic small vessel ischemia. No intracranial fluid collection. Calvarium is intact. Mild mucosal thickening dependent right maxillary sinus. Minimal opacification of lower right mastoid air cells. IMPRESSION: 1.  No acute intracranial abnormality. 2. Mild atrophy and chronic small vessel ischemic change. 3. Mild paranasal sinus inflammatory change. Electronically Signed   By: Jeb Levering M.D.   On: 02/15/2015 23:02   I have personally reviewed and evaluated these images  and lab results as part of my medical decision-making.   EKG Interpretation   Date/Time:  Tuesday February 15 2015 21:30:29 EST Ventricular Rate:  83 PR Interval:    QRS Duration: 88 QT Interval:  397 QTC Calculation: 466 R Axis:   70 Text Interpretation:  Normal sinus rhythm Poor data quality No significant  change since last tracing Confirmed by Jerl Munyan MD, Andee Poles [id number]) on  02/15/2015 10:22:00 PM      MDM   Final diagnoses:  Alcohol intoxication, uncomplicated (HCC)  Thrombocytopenia ([place])  Hypokalemia   55 y.o. Male complaining of "passing out" unclear of syncope but was drinking heavily.  Work up here normal except thrombocytopenia and hypokalemia.  HE also report diarrhea but no stooling or vomiting here.  Patient with elevated etoh at 274.  Patient advised to follow up with oncology for recheck platelets, avoid alcohol and oral potassium replenishment here and rx.  I personally performed the services described in this documentation, which was scribed in my presence. The recorded information has been reviewed and considered.   Pattricia Boss, MD 02/17/15 1515

## 2015-02-15 NOTE — ED Notes (Signed)
Pt unable to provide stool sample at this time

## 2015-02-15 NOTE — ED Notes (Signed)
Pt states he fell and hit the back of his head and "blacked out". Pt is alert and oriented X4.

## 2015-02-15 NOTE — Discharge Instructions (Signed)
Alcohol Intoxication Alcohol intoxication occurs when you drink enough alcohol that it affects your ability to function. It can be mild or very severe. Drinking a lot of alcohol in a short time is called binge drinking. This can be very harmful. Drinking alcohol can also be more dangerous if you are taking medicines or other drugs. Some of the effects caused by alcohol may include:  Loss of coordination.  Changes in mood and behavior.  Unclear thinking.  Trouble talking (slurred speech).  Throwing up (vomiting).  Confusion.  Slowed breathing.  Twitching and shaking (seizures).  Loss of consciousness. HOME CARE  Do not drive after drinking alcohol.  Drink enough water and fluids to keep your pee (urine) clear or pale yellow. Avoid caffeine.  Only take medicine as told by your doctor. GET HELP IF:  You throw up (vomit) many times.  You do not feel better after a few days.  You frequently have alcohol intoxication. Your doctor can help decide if you should see a substance use treatment counselor. GET HELP RIGHT AWAY IF:  You become shaky when you stop drinking.  You have twitching and shaking.  You throw up blood. It may look bright red or like coffee grounds.  You notice blood in your poop (bowel movements).  You become lightheaded or pass out (faint). MAKE SURE YOU:   Understand these instructions.  Will watch your condition.  Will get help right away if you are not doing well or get worse.   This information is not intended to replace advice given to you by your health care provider. Make sure you discuss any questions you have with your health care provider.   Document Released: 07/18/2007 Document Revised: 10/01/2012 Document Reviewed: 07/04/2012 Elsevier Interactive Patient Education 2016 [employer] Thrombocytopenia Thrombocytopenia means there are not enough platelets in your blood. Platelets are tiny cells in your blood. When you start bleeding,  platelets clump together around the cut or injury to stop the bleeding. This process is called blood clotting. Not having enough platelets can cause bleeding problems. HOME CARE  Check your skin and inside your mouth for bruises or blood as told by your doctor.  Check your spit (sputum), pee (urine), and poop (stool) for blood as told by your doctor.  Do not do activities that can cause bumps or bruises until your doctor says it is okay.  Be careful not to cut yourself when you shave or use scissors, needles, knives, or other tools.  Be careful not to burn yourself when you iron or cook.  Ask your doctor if you can drink alcohol.  Only take medicines as told by your doctor.  Tell all your doctors and your dentist that you have this bleeding problem. GET HELP RIGHT AWAY IF:  You are bleeding anywhere on your body.  You are bleeding or have bruises without knowing why.  You have blood in your spit, pee, or poop. MAKE SURE YOU:  Understand these instructions.  Will watch your condition.  Will get help right away if you are not doing well or get worse.   This information is not intended to replace advice given to you by your health care provider. Make sure you discuss any questions you have with your health care provider.   Document Released: 01/18/2011 Document Revised: 04/23/2011 Document Reviewed: 08/02/2014 Elsevier Interactive Patient Education 2016 [employer]. Hypokalemia Hypokalemia means that the amount of potassium in the blood is lower than normal.Potassium is a chemical, called an electrolyte, that  helps regulate the amount of fluid in the body. It also stimulates muscle contraction and helps nerves function properly.Most of the body's potassium is inside of cells, and only a very small amount is in the blood. Because the amount in the blood is so small, minor changes can be life-threatening. CAUSES  Antibiotics.  Diarrhea or vomiting.  Using laxatives too  much, which can cause diarrhea.  Chronic kidney disease.  Water pills (diuretics).  Eating disorders (bulimia).  Low magnesium level.  Sweating a lot. SIGNS AND SYMPTOMS  Weakness.  Constipation.  Fatigue.  Muscle cramps.  Mental confusion.  Skipped heartbeats or irregular heartbeat (palpitations).  Tingling or numbness. DIAGNOSIS  Your health care provider can diagnose hypokalemia with blood tests. In addition to checking your potassium level, your health care provider may also check other lab tests. TREATMENT Hypokalemia can be treated with potassium supplements taken by mouth or adjustments in your current medicines. If your potassium level is very low, you may need to get potassium through a vein (IV) and be monitored in the hospital. A diet high in potassium is also helpful. Foods high in potassium are:  Nuts, such as peanuts and pistachios.  Seeds, such as sunflower seeds and pumpkin seeds.  Peas, lentils, and lima beans.  Whole grain and bran cereals and breads.  Fresh fruit and vegetables, such as apricots, avocado, bananas, cantaloupe, kiwi, oranges, tomatoes, asparagus, and potatoes.  Orange and tomato juices.  Red meats.  Fruit yogurt. HOME CARE INSTRUCTIONS  Take all medicines as prescribed by your health care provider.  Maintain a healthy diet by including nutritious food, such as fruits, vegetables, nuts, whole grains, and lean meats.  If you are taking a laxative, be sure to follow the directions on the label. SEEK MEDICAL CARE IF:  Your weakness gets worse.  You feel your heart pounding or racing.  You are vomiting or having diarrhea.  You are diabetic and having trouble keeping your blood glucose in the normal range. SEEK IMMEDIATE MEDICAL CARE IF:  You have chest pain, shortness of breath, or dizziness.  You are vomiting or having diarrhea for more than 2 days.  You faint. MAKE SURE YOU:   Understand these instructions.  Will  watch your condition.  Will get help right away if you are not doing well or get worse.   This information is not intended to replace advice given to you by your health care provider. Make sure you discuss any questions you have with your health care provider.   Document Released: 01/29/2005 Document Revised: 02/19/2014 Document Reviewed: 08/01/2012 Elsevier Interactive Patient Education [employer].

## 2015-02-18 ENCOUNTER — Telehealth (HOSPITAL_COMMUNITY): Payer: Self-pay | Admitting: Emergency Medicine

## 2015-02-18 NOTE — Telephone Encounter (Signed)
-----   Message from Patrici Ranks, MD sent at 02/18/2015  8:42 AM EST ----- Scan is slightly better. Continue to take gleevec every day! Dr.P

## 2015-03-08 NOTE — H&P (Signed)
  NT[facility] SOAP Note  Vital Signs:  Vitals as of: 99991111: Systolic 123XX123: Diastolic 72: Heart Rate 79: Temp 97.50F (Temporal): Height 33ft 1in: Weight 187Lbs 0 Ounces: BMI 24.67   BMI : 24.67 kg/m2  Subjective: This 55 year old male presents for of need for central venous access for treatment of GIST tumor.  Review of Symptoms:  Constitutional:fatigue dizzy, tremors Eyes:vision problems bilateral sinus problems Cardiovascular:  unremarkable Respiratory:dyspnea, wheezing Gastrointestinabdominal pain, nausea, vomiting Genitourinary:unremarkable   joint, back, and neck pain Skin:unremarkable Hematolgic/Lymphatic:unremarkable   Allergic/Immunologic:unremarkable   Past Medical History:  Reviewed  Past Medical History  Surgical History: partial colectomy, exploratory lap Medical Problems: GIST tumor, bipolar disorder, h/o alcohol/cocaine use Psychiatric History: bipolar Allergies: nkda Medications: asa, busprione, gleevec, mirtazapine, prilosec   Social History:Reviewed  Social History  Preferred Language: English Race:  White Ethnicity: Not Hispanic / Latino Age: 60 year Marital Status:  S Alcohol: no drink in two weeks Recreational drug(s): Has not taken cocaine for many months   Smoking Status: Current every day smoker reviewed on 03/08/2015 Started Date:  Packs per week:  Functional Status reviewed on 03/08/2015 ------------------------------------------------ Bathing: Normal Cooking: Normal Dressing: Normal Driving: Normal Eating: Normal Managing Meds: Normal Oral Care: Normal Shopping: Normal Toileting: Normal Transferring: Normal Walking: Normal Cognitive Status reviewed on 03/08/2015 ------------------------------------------------ Attention: Normal Decision Making: Normal Language: Normal Memory: Normal Motor: Normal Perception: Normal Problem Solving: Normal Visual and Spatial: Normal   Family History:Reviewed  Family Health  History Family History is Unknown    Objective Information: General:Well appearing, well nourished in no distress. Heart:RRR, no murmur Lungs:  CTA bilaterally, no wheezes, rhonchi, rales.  Breathing unlabored.  Assessment:GIST tumor, need for central venous access  Diagnoses: 238.1  D49.0 Gastrointestinal stromal tumor (Neoplasm of unspecified behavior of digestive system)  Procedures: [id number] - OFFICE OUTPATIENT NEW 30 MINUTES    Plan:  Scheduled for portacath insertion on 03/14/15.   Patient Education:Alternative treatments to surgery were discussed with patient (and family).  Risks and benefits  of procedure including bleeding, infection, and pneumothorax were fully explained to the patient (and family) who gave informed consent. Patient/family questions were addressed.  Follow-up:Pending Surgery

## 2015-03-10 NOTE — Patient Instructions (Signed)
[name]  03/10/2015     @PREFPERIOPPHARMACY @   Your procedure is scheduled on  03/14/2015   Report to [facility] at  845  A.M.  Call this number if you have problems the morning of surgery:  [phone]   Remember:  Do not eat food or drink liquids after midnight.  Take these medicines the morning of surgery with A SIP OF WATER  Buspar, gleevac, prolisec.   Do not wear jewelry, make-up or nail polish.  Do not wear lotions, powders, or perfumes.  You may wear deodorant.  Do not shave 48 hours prior to surgery.  Men may shave face and neck.  Do not bring valuables to the hospital.  [facility] is not responsible for any belongings or valuables.  Contacts, dentures or bridgework may not be worn into surgery.  Leave your suitcase in the car.  After surgery it may be brought to your room.  For patients admitted to the hospital, discharge time will be determined by your treatment team.  Patients discharged the day of surgery will not be allowed to drive home.   Name and phone number of your driver:   family Special instructions:  none  Please read over the following fact sheets that you were given. Pain Booklet, Coughing and Deep Breathing, Surgical Site Infection Prevention, Anesthesia Post-op Instructions and Care and Recovery After Surgery      Implanted Port Insertion An implanted port is a central line that has a round shape and is placed under the skin. It is used as a long-term IV access for:   Medicines, such as chemotherapy.   Fluids.   Liquid nutrition, such as total parenteral nutrition (TPN).   Blood samples.  LET [facility] CARE PROVIDER KNOW ABOUT:  Allergies to food or medicine.   Medicines taken, including vitamins, herbs, eye drops, creams, and over-the-counter medicines.   Any allergies to heparin.  Use of steroids (by mouth or creams).   Previous problems with anesthetics or numbing medicines.   History of bleeding  problems or blood clots.   Previous surgery.   Other health problems, including diabetes and kidney problems.   Possibility of pregnancy, if this applies. RISKS AND COMPLICATIONS Generally, this is a safe procedure. However, as with any procedure, problems can occur. Possible problems include:  Damage to the blood vessel, bruising, or bleeding at the puncture site.   Infection.  Blood clot in the vessel that the port is in.  Breakdown of the skin over your port.  Very rarely a person may develop a condition called a pneumothorax, a collection of air in the chest that may cause one of the lungs to collapse. The placement of these catheters with the appropriate imaging guidance significantly decreases the risk of a pneumothorax.  BEFORE THE PROCEDURE   Your health care provider may want you to have blood tests. These tests can help tell how well your kidneys and liver are working. They can also show how well your blood clots.   If you take blood thinners (anticoagulant medicines), ask your health care provider when you should stop taking them.   Make arrangements for someone to drive you home. This is necessary if you have been sedated for your procedure.  PROCEDURE  Port insertion usually takes about 30-45 minutes.   An IV needle will be inserted in your arm. Medicine for pain and medicine to help relax you (sedative) will flow directly  into your body through this needle.   You will lie on an exam table, and you will be connected to monitors to keep track of your heart rate, blood pressure, and breathing throughout the procedure.  An oxygen monitoring device may be attached to your finger. Oxygen will be given.   Everything will be kept as germ free (sterile) as possible during the procedure. The skin near the point of the incision will be cleansed with antiseptic, and the area will be draped with sterile towels. The skin and deeper tissues over the port area will be made  numb with a local anesthetic.  Two small cuts (incisions) will be made in the skin to insert the port. One will be made in the neck to obtain access to the vein where the catheter will lie.   Because the port reservoir will be placed under the skin, a small skin incision will be made in the upper chest, and a small pocket for the port will be made under the skin. The catheter that will be connected to the port tunnels to a large central vein in the chest. A small, raised area will remain on your body at the site of the reservoir when the procedure is complete.  The port placement will be done under imaging guidance to ensure the proper placement.  The reservoir has a silicone covering that can be punctured with a special needle.   The port will be flushed with normal saline, and blood will be drawn to make sure it is working properly.  There will be nothing remaining outside the skin when the procedure is finished.   Incisions will be held together by stitches, surgical glue, or a special tape. AFTER THE PROCEDURE  You will stay in a recovery area until the anesthesia has worn off. Your blood pressure and pulse will be checked.  A final chest X-ray will be taken to check the placement of the port and to ensure that there is no injury to your lung.   This information is not intended to replace advice given to you by your health care provider. Make sure you discuss any questions you have with your health care provider.   Document Released: 11/19/2012 Document Revised: 02/19/2014 Document Reviewed: 11/19/2012 Elsevier Interactive Patient Education 2016 [place] Insertion, Care After Refer to this sheet in the next few weeks. These instructions provide you with information on caring for yourself after your procedure. Your health care provider may also give you more specific instructions. Your treatment has been planned according to current medical practices, but  problems sometimes occur. Call your health care provider if you have any problems or questions after your procedure. WHAT TO EXPECT AFTER THE PROCEDURE After your procedure, it is typical to have the following:   Discomfort at the port insertion site. Ice packs to the area will help.  Bruising on the skin over the port. This will subside in 3-4 days. HOME CARE INSTRUCTIONS  After your port is placed, you will get a manufacturer's information card. The card has information about your port. Keep this card with you at all times.   Know what kind of port you have. There are many types of ports available.   Wear a medical alert bracelet in case of an emergency. This can help alert health care workers that you have a port.   The port can stay in for as long as your health care provider believes it is necessary.   A  home health care nurse may give medicines and take care of the port.   You or a family member can get special training and directions for giving medicine and taking care of the port at home.  SEEK MEDICAL CARE IF:   Your port does not flush or you are unable to get a blood return.   You have a fever or chills. SEEK IMMEDIATE MEDICAL CARE IF:  You have new fluid or pus coming from your incision.   You notice a bad smell coming from your incision site.   You have swelling, pain, or more redness at the incision or port site.   You have chest pain or shortness of breath.   This information is not intended to replace advice given to you by your health care provider. Make sure you discuss any questions you have with your health care provider.   Document Released: 11/19/2012 Document Revised: 02/03/2013 Document Reviewed: 11/19/2012 Elsevier Interactive Patient Education 2016 [employer] PATIENT INSTRUCTIONS POST-ANESTHESIA  IMMEDIATELY FOLLOWING SURGERY:  Do not drive or operate machinery for the first twenty four hours after surgery.  Do not make any important  decisions for twenty four hours after surgery or while taking narcotic pain medications or sedatives.  If you develop intractable nausea and vomiting or a severe headache please notify your doctor immediately.  FOLLOW-UP:  Please make an appointment with your surgeon as instructed. You do not need to follow up with anesthesia unless specifically instructed to do so.  WOUND CARE INSTRUCTIONS (if applicable):  Keep a dry clean dressing on the anesthesia/puncture wound site if there is drainage.  Once the wound has quit draining you may leave it open to air.  Generally you should leave the bandage intact for twenty four hours unless there is drainage.  If the epidural site drains for more than 36-48 hours please call the anesthesia department.  QUESTIONS?:  Please feel free to call your physician or the hospital operator if you have any questions, and they will be happy to assist you.

## 2015-03-11 ENCOUNTER — Encounter (HOSPITAL_COMMUNITY): Payer: Self-pay

## 2015-03-11 ENCOUNTER — Encounter (HOSPITAL_COMMUNITY)
Admission: RE | Admit: 2015-03-11 | Discharge: 2015-03-11 | Disposition: A | Discharge status: Home / Self Care | Payer: Medicaid Other | Source: Ambulatory Visit | Attending: General Surgery | Admitting: General Surgery

## 2015-03-14 ENCOUNTER — Ambulatory Visit (HOSPITAL_COMMUNITY)
Admission: RE | Admit: 2015-03-14 | Discharge: 2015-03-14 | Disposition: A | Discharge status: Home / Self Care | Payer: Medicaid Other | Source: Ambulatory Visit | Attending: General Surgery | Admitting: General Surgery

## 2015-03-14 ENCOUNTER — Ambulatory Visit (HOSPITAL_COMMUNITY): Payer: Medicaid Other | Admitting: Anesthesiology

## 2015-03-14 ENCOUNTER — Encounter (HOSPITAL_COMMUNITY): Payer: Self-pay | Admitting: *Deleted

## 2015-03-14 ENCOUNTER — Ambulatory Visit (HOSPITAL_COMMUNITY): Payer: Medicaid Other

## 2015-03-14 ENCOUNTER — Encounter (HOSPITAL_COMMUNITY)
Admission: RE | Disposition: A | Discharge status: Home / Self Care | Payer: Self-pay | Source: Ambulatory Visit | Attending: General Surgery

## 2015-03-14 DIAGNOSIS — F319 Bipolar disorder, unspecified: Secondary | ICD-10-CM | POA: Diagnosis not present

## 2015-03-14 DIAGNOSIS — F17200 Nicotine dependence, unspecified, uncomplicated: Secondary | ICD-10-CM | POA: Insufficient documentation

## 2015-03-14 DIAGNOSIS — Z7982 Long term (current) use of aspirin: Secondary | ICD-10-CM | POA: Diagnosis not present

## 2015-03-14 DIAGNOSIS — D481 Neoplasm of uncertain behavior of connective and other soft tissue: Secondary | ICD-10-CM | POA: Diagnosis present

## 2015-03-14 DIAGNOSIS — K219 Gastro-esophageal reflux disease without esophagitis: Secondary | ICD-10-CM | POA: Insufficient documentation

## 2015-03-14 DIAGNOSIS — Z95828 Presence of other vascular implants and grafts: Secondary | ICD-10-CM

## 2015-03-14 HISTORY — PX: PORTACATH PLACEMENT: SHX2246

## 2015-03-14 LAB — RAPID URINE DRUG SCREEN, HOSP PERFORMED
Amphetamines: POSITIVE — AB
BARBITURATES: NOT DETECTED
BENZODIAZEPINES: NOT DETECTED
COCAINE: NOT DETECTED
OPIATES: NOT DETECTED
Tetrahydrocannabinol: NOT DETECTED

## 2015-03-14 SURGERY — INSERTION, TUNNELED CENTRAL VENOUS DEVICE, WITH PORT
Anesthesia: Monitor Anesthesia Care | Site: Chest | Laterality: Right

## 2015-03-14 MED ORDER — OXYCODONE-ACETAMINOPHEN 7.5-325 MG PO TABS: 1.0000 | ORAL_TABLET | ORAL | Status: DC | PRN

## 2015-03-14 MED ORDER — PROPOFOL 10 MG/ML IV BOLUS
INTRAVENOUS | Status: AC
Start: 2015-03-14 — End: 2015-03-14
  Filled 2015-03-14: qty 20

## 2015-03-14 MED ORDER — MIDAZOLAM HCL 2 MG/2ML IJ SOLN
1.0000 mg | INTRAMUSCULAR | Status: DC | PRN
  Administered 2015-03-14 (×2): 2 mg via INTRAVENOUS
  Filled 2015-03-14: qty 2

## 2015-03-14 MED ORDER — FENTANYL CITRATE (PF) 100 MCG/2ML IJ SOLN
25.0000 ug | INTRAMUSCULAR | Status: DC | PRN
  Administered 2015-03-14 (×2): 50 ug via INTRAVENOUS
  Filled 2015-03-14: qty 2

## 2015-03-14 MED ORDER — FENTANYL CITRATE (PF) 100 MCG/2ML IJ SOLN
25.0000 ug | INTRAMUSCULAR | Status: AC
Start: 2015-03-14 — End: 2015-03-14
  Administered 2015-03-14 (×2): 25 ug via INTRAVENOUS

## 2015-03-14 MED ORDER — HEPARIN SOD (PORK) LOCK FLUSH 100 UNIT/ML IV SOLN
INTRAVENOUS | Status: AC
  Filled 2015-03-14: qty 5

## 2015-03-14 MED ORDER — LIDOCAINE HCL (PF) 1 % IJ SOLN
INTRAMUSCULAR | Status: AC
  Filled 2015-03-14: qty 5

## 2015-03-14 MED ORDER — LIDOCAINE HCL (PF) 1 % IJ SOLN
INTRAMUSCULAR | Status: AC
  Filled 2015-03-14: qty 30

## 2015-03-14 MED ORDER — SODIUM CHLORIDE 0.9 % IR SOLN
Status: DC | PRN
  Administered 2015-03-14: 500 mL

## 2015-03-14 MED ORDER — FENTANYL CITRATE (PF) 100 MCG/2ML IJ SOLN
INTRAMUSCULAR | Status: AC
  Filled 2015-03-14: qty 2

## 2015-03-14 MED ORDER — LACTATED RINGERS IV SOLN: INTRAVENOUS | Status: DC | PRN

## 2015-03-14 MED ORDER — CHLORHEXIDINE GLUCONATE 4 % EX LIQD: 1.0000 "application " | Freq: Once | CUTANEOUS | Status: DC

## 2015-03-14 MED ORDER — LIDOCAINE HCL (PF) 1 % IJ SOLN
INTRAMUSCULAR | Status: DC | PRN
  Administered 2015-03-14: 8 mL

## 2015-03-14 MED ORDER — HEPARIN SOD (PORK) LOCK FLUSH 100 UNIT/ML IV SOLN
INTRAVENOUS | Status: DC | PRN
  Administered 2015-03-14: 500 [IU] via INTRAVENOUS

## 2015-03-14 MED ORDER — ONDANSETRON HCL 4 MG/2ML IJ SOLN: 4.0000 mg | Freq: Once | INTRAMUSCULAR | Status: DC | PRN

## 2015-03-14 MED ORDER — FENTANYL CITRATE (PF) 100 MCG/2ML IJ SOLN: INTRAMUSCULAR | Status: DC | PRN

## 2015-03-14 MED ORDER — MIDAZOLAM HCL 5 MG/5ML IJ SOLN
INTRAMUSCULAR | Status: DC | PRN
  Administered 2015-03-14: 2 mg via INTRAVENOUS

## 2015-03-14 MED ORDER — MIDAZOLAM HCL 2 MG/2ML IJ SOLN
INTRAMUSCULAR | Status: AC
  Filled 2015-03-14: qty 2

## 2015-03-14 MED ORDER — PROPOFOL 500 MG/50ML IV EMUL
INTRAVENOUS | Status: DC | PRN
  Administered 2015-03-14: 150 ug/kg/min via INTRAVENOUS

## 2015-03-14 MED ORDER — CEFAZOLIN SODIUM-DEXTROSE 2-3 GM-% IV SOLR
2.0000 g | INTRAVENOUS | Status: AC
  Administered 2015-03-14: 2 g via INTRAVENOUS
  Filled 2015-03-14: qty 50

## 2015-03-14 MED ORDER — LACTATED RINGERS IV SOLN
INTRAVENOUS | Status: DC
  Administered 2015-03-14: 10:00:00 via INTRAVENOUS

## 2015-03-14 MED ORDER — KETOROLAC TROMETHAMINE 30 MG/ML IJ SOLN
30.0000 mg | Freq: Once | INTRAMUSCULAR | Status: AC
  Administered 2015-03-14: 30 mg via INTRAVENOUS
  Filled 2015-03-14: qty 1

## 2015-03-14 SURGICAL SUPPLY — 39 items
ADH SKN CLS APL DERMABOND .7 (GAUZE/BANDAGES/DRESSINGS) ×1
APPLIER CLIP 9.375 SM OPEN (CLIP)
APR CLP SM 9.3 20 MLT OPN (CLIP)
BAG DECANTER FOR FLEXI CONT (MISCELLANEOUS) ×3 IMPLANT
BAG HAMPER (MISCELLANEOUS) ×3 IMPLANT
CATH HICKMAN DUAL 12.0 (CATHETERS) IMPLANT
CHLORAPREP W/TINT 10.5 ML (MISCELLANEOUS) ×3 IMPLANT
CLIP APPLIE 9.375 SM OPEN (CLIP) IMPLANT
CLOTH BEACON ORANGE TIMEOUT ST (SAFETY) ×3 IMPLANT
COVER LIGHT HANDLE STERIS (MISCELLANEOUS) ×6 IMPLANT
DECANTER SPIKE VIAL GLASS SM (MISCELLANEOUS) ×3 IMPLANT
DERMABOND ADVANCED (GAUZE/BANDAGES/DRESSINGS) ×2
DERMABOND ADVANCED .7 DNX12 (GAUZE/BANDAGES/DRESSINGS) ×1 IMPLANT
DRAPE C-ARM FOLDED MOBILE STRL (DRAPES) ×3 IMPLANT
ELECT REM PT RETURN 9FT ADLT (ELECTROSURGICAL) ×3
ELECTRODE REM PT RTRN 9FT ADLT (ELECTROSURGICAL) ×1 IMPLANT
GLOVE BIOGEL PI IND STRL 7.0 (GLOVE) ×1 IMPLANT
GLOVE BIOGEL PI INDICATOR 7.0 (GLOVE) ×4
GLOVE SURG SS PI 7.5 STRL IVOR (GLOVE) ×6 IMPLANT
GOWN STRL REUS W/TWL LRG LVL3 (GOWN DISPOSABLE) ×6 IMPLANT
IV NS 500ML (IV SOLUTION) ×3
IV NS 500ML BAXH (IV SOLUTION) ×1 IMPLANT
KIT PORT POWER 8FR ISP MRI (CATHETERS) ×3 IMPLANT
KIT PORT POWER ISP 8FR (Catheter) ×2 IMPLANT
KIT ROOM TURNOVER APOR (KITS) ×3 IMPLANT
MANIFOLD NEPTUNE II (INSTRUMENTS) ×3 IMPLANT
NDL HYPO 25X1 1.5 SAFETY (NEEDLE) ×1 IMPLANT
NEEDLE HYPO 25X1 1.5 SAFETY (NEEDLE) ×3 IMPLANT
PACK MINOR (CUSTOM PROCEDURE TRAY) ×3 IMPLANT
PAD ARMBOARD 7.5X6 YLW CONV (MISCELLANEOUS) ×3 IMPLANT
SET BASIN LINEN APH (SET/KITS/TRAYS/PACK) ×3 IMPLANT
SET INTRODUCER 12FR PACEMAKER (SHEATH) IMPLANT
SHEATH COOK PEEL AWAY SET 8F (SHEATH) IMPLANT
SUT PROLENE 3 0 PS 2 (SUTURE) IMPLANT
SUT VIC AB 3-0 SH 27 (SUTURE) ×3
SUT VIC AB 3-0 SH 27X BRD (SUTURE) ×1 IMPLANT
SUT VIC AB 4-0 PS2 27 (SUTURE) ×3 IMPLANT
SYR 20CC LL (SYRINGE) ×3 IMPLANT
SYR CONTROL 10ML LL (SYRINGE) ×3 IMPLANT

## 2015-03-14 NOTE — Transfer of Care (Signed)
Immediate Anesthesia Transfer of Care Note  Patient: [name]  Procedure(s) Performed: Procedure(s): INSERTION PORT-A-CATH ([name])  Patient Location: PACU  Anesthesia Type:MAC  Level of Consciousness: awake, alert  and oriented  Airway & Oxygen Therapy: Patient Spontanous Breathing and Patient connected to nasal cannula oxygen  Post-op Assessment: Report given to RN and Post -op Vital signs reviewed and stable  Post vital signs: Reviewed and stable  Last Vitals:  Filed Vitals:   03/14/15 0935 03/14/15 0940  BP: 149/84 146/84  Temp:    Resp: 0 22    Complications: No apparent anesthesia complications

## 2015-03-14 NOTE — Anesthesia Procedure Notes (Signed)
Procedure Name: MAC Date/Time: 03/14/2015 10:03 AM Performed by: Andree Elk, Avonlea Sima A Pre-anesthesia Checklist: Patient identified, Timeout performed, Emergency Drugs available, Suction available and Patient being monitored Patient Re-evaluated:Patient Re-evaluated prior to inductionOxygen Delivery Method: Simple face mask

## 2015-03-14 NOTE — Anesthesia Preprocedure Evaluation (Addendum)
Anesthesia Evaluation  Patient identified by MRN, date of birth, ID band Patient awake    Reviewed: Allergy & Precautions, NPO status , Patient's Chart, lab work & pertinent test results  Airway Mallampati: I  TM Distance: >3 FB     Dental  (+) Edentulous Upper, Edentulous Lower   Pulmonary Current Smoker,           Cardiovascular negative cardio ROS   Rhythm:Regular Rate:Normal     Neuro/Psych Bipolar Disorder    GI/Hepatic GERD  ,GIST tumor    Endo/Other    Renal/GU      Musculoskeletal   Abdominal   Peds  Hematology   Anesthesia Other Findings ETOH abuse Cocaine hx  Reproductive/Obstetrics                            Anesthesia Physical Anesthesia Plan  ASA: III  Anesthesia Plan: MAC   Post-op Pain Management:    Induction: Intravenous  Airway Management Planned: Simple Face Mask  Additional Equipment:   Intra-op Plan:   Post-operative Plan:   Informed Consent: I have reviewed the patients History and Physical, chart, labs and discussed the procedure including the risks, benefits and alternatives for the proposed anesthesia with the patient or authorized representative who has indicated his/her understanding and acceptance.     Plan Discussed with:   Anesthesia Plan Comments:         Anesthesia Quick Evaluation

## 2015-03-14 NOTE — Anesthesia Postprocedure Evaluation (Signed)
Anesthesia Post Note  Patient: [name]  Procedure(s) Performed: Procedure(s) (LRB): INSERTION PORT-A-CATH ([name])  Patient location during evaluation: PACU Anesthesia Type: MAC Level of consciousness: awake and alert and oriented Pain management: pain level controlled Vital Signs Assessment: post-procedure vital signs reviewed and stable Respiratory status: spontaneous breathing and respiratory function stable Cardiovascular status: stable Postop Assessment: no signs of nausea or vomiting Anesthetic complications: no    Last Vitals:  Filed Vitals:   03/14/15 0935 03/14/15 0940  BP: 149/84 146/84  Temp:    Resp: 0 22    Last Pain:  Filed Vitals:   03/14/15 0941  PainSc: 7                  ADAMS, AMY A

## 2015-03-14 NOTE — Op Note (Signed)
Patient:  [name]  DOB:  1960/08/01  MRN:  [id number]   Preop Diagnosis:   Gastrointestinal stromal tumor of intestines  Postop Diagnosis:   same  Procedure:   Port-A-Cath insertion  Surgeon:   Aviva Signs, M.D.  Anes:   Mac  Indications:   Patient is a 56 year old white male who presents for a Port-A-Cath insertion as he is about to undergo chemotherapy for a GIST. The risks and benefits of the procedure including bleeding, infection, and pneumothorax were fully explained to the patient, who gave informed consent.  Procedure note:   The patient was placed in the Trendelenburg position after the right upper chest was prepped and draped using usual sterile technique with DuraPrep. Surgical site confirmation was performed.   1% Xylocaine was used for local anesthesia. An incision was made below the right clavicle. A subcutaneous pocket was then formed. A needle is advanced into the right subclavian vein using the Seldinger technique without difficulty. A guidewire was then advanced into the right atrium under fluoroscopic guidance. An introducer peel-away sheath were placed over the guidewire. The catheter was introduced through the peel-away sheath the peel-away sheath was removed. The catheter was then attached to the port and the port placed in subcutaneous pocket. Adequate positioning was confirmed by fluoroscopy. Good backflow blood was noted in the port. The port was flushed with heparin flush. The subcutaneous layer was reapproximated using a 3-0 Vicryl interrupted suture. The skin was closed using a 4-0 Vicryl subcuticular suture. Dermabond was applied.  All tape and needle counts were correct at the end of the procedure. Patient was transferred to PACU in stable condition. Chest x-ray will be performed at that time.  Complications:   none  EBL:   minimal  Specimen:   none

## 2015-03-14 NOTE — Discharge Instructions (Signed)
Implanted Port Insertion, Care After °Refer to this sheet in the next few weeks. These instructions provide you with information on caring for yourself after your procedure. Your health care provider may also give you more specific instructions. Your treatment has been planned according to current medical practices, but problems sometimes occur. Call your health care provider if you have any problems or questions after your procedure. °WHAT TO EXPECT AFTER THE PROCEDURE °After your procedure, it is typical to have the following:  °· Discomfort at the port insertion site. Ice packs to the area will help. °· Bruising on the skin over the port. This will subside in 3-4 days. °HOME CARE INSTRUCTIONS °· After your port is placed, you will get a manufacturer's information card. The card has information about your port. Keep this card with you at all times.   °· Know what kind of port you have. There are many types of ports available.   °· Wear a medical alert bracelet in case of an emergency. This can help alert health care workers that you have a port.   °· The port can stay in for as long as your health care provider believes it is necessary.   °· A home health care nurse may give medicines and take care of the port.   °· You or a family member can get special training and directions for giving medicine and taking care of the port at home.   °SEEK MEDICAL CARE IF:  °· Your port does not flush or you are unable to get a blood return.   °· You have a fever or chills. °SEEK IMMEDIATE MEDICAL CARE IF: °· You have new fluid or pus coming from your incision.   °· You notice a bad smell coming from your incision site.   °· You have swelling, pain, or more redness at the incision or port site.   °· You have chest pain or shortness of breath. °  °This information is not intended to replace advice given to you by your health care provider. Make sure you discuss any questions you have with your health care provider. °  °Document  Released: 11/19/2012 Document Revised: 02/03/2013 Document Reviewed: 11/19/2012 °Elsevier Interactive Patient Education ©2016 Else[employer] Inc[employer] °Implanted Port Home Guide °An implanted port is a type of central line that is placed under the skin. Central lines are used to provide IV access when treatment or nutrition needs to be given through a person's veins. Implanted ports are used for long-term IV access. An implanted port may be placed because:  °· You need IV medicine that would be irritating to the small veins in your hands or arms.   °· You need long-term IV medicines, such as antibiotics.   °· You need IV nutrition for a long period.   °· You need frequent blood draws for lab tests.   °· You need dialysis.   °Implanted ports are usually placed in the chest area, but they can also be placed in the upper arm, the abdomen, or the leg. An implanted port has two main parts:  °· Reservoir. The reservoir is round and will appear as a small, raised area under your skin. The reservoir is the part where a needle is inserted to give medicines or draw blood.   °· Catheter. The catheter is a thin, flexible tube that extends from the reservoir. The catheter is placed into a large vein. Medicine that is inserted into the reservoir goes into the catheter and then into the vein.   °HOW WILL I CARE FOR MY INCISION SITE? °Do not get the   incision site wet. Bathe or shower as directed by your health care provider.  °HOW IS MY PORT ACCESSED? °Special steps must be taken to access the port:  °· Before the port is accessed, a numbing cream can be placed on the skin. This helps numb the skin over the port site.   °· Your health care provider uses a sterile technique to access the port. °· Your health care provider must put on a mask and sterile gloves. °· The skin over your port is cleaned carefully with an antiseptic and allowed to dry. °· The port is gently pinched between sterile gloves, and a needle is inserted into the  port. °· Only "non-coring" port needles should be used to access the port. Once the port is accessed, a blood return should be checked. This helps ensure that the port is in the vein and is not clogged.   °· If your port needs to remain accessed for a constant infusion, a clear (transparent) bandage will be placed over the needle site. The bandage and needle will need to be changed every week, or as directed by your health care provider.   °· Keep the bandage covering the needle clean and dry. Do not get it wet. Follow your health care provider's instructions on how to take a shower or bath while the port is accessed.   °· If your port does not need to stay accessed, no bandage is needed over the port.   °WHAT IS FLUSHING? °Flushing helps keep the port from getting clogged. Follow your health care provider's instructions on how and when to flush the port. Ports are usually flushed with saline solution or a medicine called heparin. The need for flushing will depend on how the port is used.  °· If the port is used for intermittent medicines or blood draws, the port will need to be flushed:   °· After medicines have been given.   °· After blood has been drawn.   °· As part of routine maintenance.   °· If a constant infusion is running, the port may not need to be flushed.   °HOW LONG WILL MY PORT STAY IMPLANTED? °The port can stay in for as long as your health care provider thinks it is needed. When it is time for the port to come out, surgery will be done to remove it. The procedure is similar to the one performed when the port was put in.  °WHEN SHOULD I SEEK IMMEDIATE MEDICAL CARE? °When you have an implanted port, you should seek immediate medical care if:  °· You notice a bad smell coming from the incision site.   °· You have swelling, redness, or drainage at the incision site.   °· You have more swelling or pain at the port site or the surrounding area.   °· You have a fever that is not controlled with  medicine. °  °This information is not intended to replace advice given to you by your health care provider. Make sure you discuss any questions you have with your health care provider. °  °Document Released: 01/29/2005 Document Revised: 11/19/2012 Document Reviewed: 10/06/2012 °Elsevier Interactive Patient Education ©2016 Elsevier Inc[employer] ° °PATIENT INSTRUCTIONS °POST-ANESTHESIA ° °IMMEDIATELY FOLLOWING SURGERY:  Do not drive or operate machinery for the first twenty four hours after surgery.  Do not make any important decisions for twenty four hours after surgery or while taking narcotic pain medications or sedatives.  If you develop intractable nausea and vomiting or a severe headache please notify your doctor immediately. ° °FOLLOW-UP:  Please make an   appointment with your surgeon as instructed. You do not need to follow up with anesthesia unless specifically instructed to do so. ° °WOUND CARE INSTRUCTIONS (if applicable):  Keep a dry clean dressing on the anesthesia/puncture wound site if there is drainage.  Once the wound has quit draining you may leave it open to air.  Generally you should leave the bandage intact for twenty four hours unless there is drainage.  If the epidural site drains for more than 36-48 hours please call the anesthesia department. ° °QUESTIONS?:  Please feel free to call your physician or the hospital operator if you have any questions, and they will be happy to assist you.    ° ° ° °

## 2015-03-14 NOTE — Addendum Note (Signed)
Addendum  created 03/14/15 1246 by Mickel Baas, CRNA   Modules edited: Anesthesia Medication Administration

## 2015-03-14 NOTE — Interval H&P Note (Signed)
History and Physical Interval Note:  03/14/2015 9:35 AM  [name]  has presented today for surgery, with the diagnosis of gist tumor  The various methods of treatment have been discussed with the patient and family. After consideration of risks, benefits and other options for treatment, the patient has consented to  Procedure(s): INSERTION PORT-A-CATH ([name]) as a surgical intervention .  The patient's history has been reviewed, patient examined, no change in status, stable for surgery.  I have reviewed the patient's chart and labs.  Questions were answered to the patient's satisfaction.     Aviva Signs A

## 2015-03-15 ENCOUNTER — Encounter (HOSPITAL_COMMUNITY): Payer: Self-pay | Admitting: General Surgery

## 2015-03-16 ENCOUNTER — Encounter (HOSPITAL_COMMUNITY): Payer: Self-pay | Admitting: General Surgery

## 2015-03-28 ENCOUNTER — Other Ambulatory Visit (HOSPITAL_COMMUNITY): Payer: Self-pay | Admitting: Oncology

## 2015-03-28 DIAGNOSIS — C49A9 Gastrointestinal stromal tumor of other sites: Secondary | ICD-10-CM

## 2015-03-28 MED ORDER — IMATINIB MESYLATE 400 MG PO TABS: 400.0000 mg | ORAL_TABLET | Freq: Every day | ORAL | Status: DC

## 2015-04-04 ENCOUNTER — Other Ambulatory Visit (HOSPITAL_COMMUNITY): Payer: Self-pay | Admitting: Oncology

## 2015-04-04 DIAGNOSIS — C49A0 Gastrointestinal stromal tumor, unspecified site: Secondary | ICD-10-CM

## 2015-04-04 MED ORDER — BUSPIRONE HCL 5 MG PO TABS: 5.0000 mg | ORAL_TABLET | Freq: Three times a day (TID) | ORAL | Status: DC

## 2015-04-04 MED ORDER — MIRTAZAPINE 45 MG PO TABS: 45.0000 mg | ORAL_TABLET | Freq: Every evening | ORAL | Status: DC | PRN

## 2015-05-08 NOTE — Assessment & Plan Note (Signed)
Stage IV GIST.  He has been followed by [facility] and [facility] in the past for this, but complications associated with his pain medication led to him being seen by [place].  Complicated by history of EtOH and cocaine abuse with a UDS being POSITIVE for cocaine and negative for opoid on 12/02/2014 leading to the discontinuation of opoid prescribing by [place].  CT imaging in December 2016 demonstrated improvement in disease.  Oncology history is updated.  Labs in 3 months: CBC diff, CMET.  Restaging imaging in 3 months: CT CAP with contrast.  Gleevac compliance encouraged.  Return in 3 months for follow-up

## 2015-05-08 NOTE — Progress Notes (Signed)
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 male returns for followup of Stage IV GIST.  He has been followed by [facility] and [facility] in the past for this, but complications associated with his pain medication led to him being seen by [place].  Complicated by history of EtOH and cocaine abuse with a UDS being POSITIVE for cocaine and negative for opoid on 12/02/2014 leading to the discontinuation of opoid prescribing by [place].    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   02/10/2015 Imaging CT CAP-  Mildly reduced size of left abdominal tumor implants. No new tumor implants identified.   03/14/2015 Procedure Port-a-cath placed by Dr. Arnoldo Morale    I personally reviewed and went over laboratory results with the patient.  The results are noted within this dictation.  Labs are updated today.  I personally reviewed and went over radiographic studies with the patient.  The results are noted within this dictation.  CT scans in December 2016 demonstrate improvement in disease.  He is tolerating Gleevac well and Gleevac compliance is strongly encouraged.  He asked for a pain medication that is prescription strength and unfortunately given his history of positive drug screens for illicit drug abuse and negative opiate drug screens, we no longer prescribed pain medications. He is advised of this  Past Medical History  Diagnosis Date  .  GIST (gastrointestinal stroma tumor), malignant, colon ([name])   . Alcohol abuse   . Ventral hernia   . PE (physical exam), annual   . Tobacco abuse   . Anemia   . GERD (gastroesophageal reflux disease)   . Hx of bipolar disorder   . Pancreatitis   . 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     has No Known Allergies.  Current Outpatient Prescriptions on File Prior to Visit  Medication Sig Dispense Refill  . Aspirin-Caffeine 845-65 MG PACK Take 1 packet by mouth as needed.    . busPIRone (BUSPAR) 5 MG tablet Take 1 tablet (5 mg total) by mouth 3 (three) times daily. 90 tablet 1  . imatinib (GLEEVEC) 400 MG tablet Take 1 tablet (400 mg total) by mouth daily. 30 tablet 2  . mirtazapine (REMERON) 45 MG tablet Take 1 tablet (45 mg total) by mouth at bedtime as needed and may repeat dose one time if needed. 30 tablet 1  . omeprazole (PRILOSEC) 40 MG capsule Take 1 capsule (40 mg total) by mouth daily. 30 capsule 1  . oxyCODONE-acetaminophen (PERCOCET) 7.5-325 MG tablet Take 1-2 tablets by mouth every 4 (four) hours as needed. (Patient not taking: Reported on 05/09/2015) 40 tablet 0  . potassium chloride SA (K-DUR,KLOR-CON) 20 MEQ tablet Take 1 tablet (20 mEq total) by mouth 2 (two) times daily. (Patient not taking: Reported on 05/09/2015) 14 tablet 0   No current facility-administered medications on file prior to visit.    Past Surgical History  Procedure  Laterality Date  . Colectomy    . Hernia repair    . Portacath placement Right 03/14/2015    Procedure: INSERTION PORT-A-CATH;  Surgeon: Aviva Signs, MD;  Location: [facility];  Service: General;  Laterality: Right;    Denies any headaches, dizziness, double vision, fevers, chills, night sweats, nausea, vomiting, diarrhea, constipation, chest pain, heart palpitations, shortness of breath, blood in stool, black tarry stool, urinary pain, urinary  burning, urinary frequency, hematuria.   PHYSICAL EXAMINATION  ECOG PERFORMANCE STATUS: 1 - Symptomatic but completely ambulatory  Filed Vitals:   05/09/15 1038  BP: 135/78  Pulse: 77  Temp: 98.1 F (36.7 C)  Resp: 18    GENERAL:alert, no distress, comfortable, cooperative, smiling, unaccompanied, and mentally handicapped in some capacity. SKIN: skin color, texture, turgor are normal, no rashes or significant lesions HEAD: Normocephalic, No masses, lesions, tenderness or abnormalities EYES: normal, PERRLA, EOMI, Conjunctiva are pink and non-injected EARS: External ears normal OROPHARYNX:lips, buccal mucosa, and tongue normal and mucous membranes are moist  NECK: supple, trachea midline LYMPH:  no palpable lymphadenopathy BREAST:not examined LUNGS: clear to auscultation  HEART: regular rate & rhythm, no murmurs and no gallops ABDOMEN:patient refused abdominal exam due to pain. BACK: Back symmetric, no curvature. EXTREMITIES:less then 2 second capillary refill, no joint deformities, effusion, or inflammation, no skin discoloration  NEURO: alert & oriented x 3 with fluent speech, no focal motor/sensory deficits, gait normal   LABORATORY DATA: CBC    Component Value Date/Time   WBC 9.4 05/09/2015 0933   RBC 3.97* 05/09/2015 0933   HGB 14.3 05/09/2015 0933   HCT 40.6 05/09/2015 0933   PLT 126* 05/09/2015 0933   MCV 102.3* 05/09/2015 0933   MCH 36.0* 05/09/2015 0933   MCHC 35.2 05/09/2015 0933   RDW 14.4 05/09/2015 0933   LYMPHSABS 1.9 05/09/2015 0933   MONOABS 0.5 05/09/2015 0933   EOSABS 0.2 05/09/2015 0933   BASOSABS 0.0 05/09/2015 0933      Chemistry      Component Value Date/Time   NA 138 05/09/2015 0933   K 3.8 05/09/2015 0933   CL 103 05/09/2015 0933   CO2 26 05/09/2015 0933   BUN 9 05/09/2015 0933   CREATININE 1.05 05/09/2015 0933      Component Value Date/Time   CALCIUM 8.5* 05/09/2015 0933   ALKPHOS 100 05/09/2015 0933   AST 32 05/09/2015 0933    ALT 13* 05/09/2015 0933   BILITOT 0.8 05/09/2015 0933      Drugs of Abuse     Component Value Date/Time   LABOPIA NONE DETECTED 03/14/2015 0857   COCAINSCRNUR NONE DETECTED 03/14/2015 0857   LABBENZ NONE DETECTED 03/14/2015 0857   AMPHETMU POSITIVE* 03/14/2015 0857   THCU NONE DETECTED 03/14/2015 0857   LABBARB NONE DETECTED 03/14/2015 0857      PENDING LABS:   RADIOGRAPHIC STUDIES:  No results found.   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  He has been followed by [facility] and [facility] in the past for this, but complications associated with his pain medication led to him being seen by [place].  Complicated by history of EtOH and cocaine abuse with a UDS being POSITIVE for cocaine and negative for opoid on 12/02/2014 leading to the discontinuation of opoid prescribing by [place].  CT imaging in December 2016 demonstrated improvement in disease.  Oncology history is updated.  Labs in 3 months: CBC diff, CMET.  Restaging  imaging in 3 months: CT CAP with contrast.  Gleevac compliance encouraged.  Return in 3 months for follow-up    THERAPY PLAN:  Continue daily Gleevac.  All questions were answered. The patient knows to call the clinic with any problems, questions or concerns. We can certainly see the patient much sooner if necessary.  Patient and plan discussed with Dr. Ancil Linsey and she is in agreement with the aforementioned.   This note is electronically signed by: Doy Mince 05/09/2015 3:35 PM

## 2015-05-09 ENCOUNTER — Encounter (HOSPITAL_COMMUNITY): Payer: Self-pay | Admitting: Oncology

## 2015-05-09 ENCOUNTER — Ambulatory Visit (HOSPITAL_COMMUNITY): Payer: Medicaid Other | Admitting: Oncology

## 2015-05-09 ENCOUNTER — Encounter (HOSPITAL_BASED_OUTPATIENT_CLINIC_OR_DEPARTMENT_OTHER): Payer: Medicaid Other | Admitting: Oncology

## 2015-05-09 ENCOUNTER — Encounter (HOSPITAL_COMMUNITY): Payer: Medicaid Other | Attending: Hematology & Oncology

## 2015-05-09 VITALS — BP 135/78 | HR 77 | Temp 98.1°F | Resp 18 | Wt 184.8 lb

## 2015-05-09 DIAGNOSIS — K219 Gastro-esophageal reflux disease without esophagitis: Secondary | ICD-10-CM | POA: Insufficient documentation

## 2015-05-09 DIAGNOSIS — Z9889 Other specified postprocedural states: Secondary | ICD-10-CM | POA: Insufficient documentation

## 2015-05-09 DIAGNOSIS — C49A3 Gastrointestinal stromal tumor of small intestine: Secondary | ICD-10-CM | POA: Insufficient documentation

## 2015-05-09 DIAGNOSIS — Z79899 Other long term (current) drug therapy: Secondary | ICD-10-CM | POA: Insufficient documentation

## 2015-05-09 DIAGNOSIS — Z85068 Personal history of other malignant neoplasm of small intestine: Secondary | ICD-10-CM | POA: Diagnosis not present

## 2015-05-09 DIAGNOSIS — F319 Bipolar disorder, unspecified: Secondary | ICD-10-CM | POA: Insufficient documentation

## 2015-05-09 DIAGNOSIS — K859 Acute pancreatitis without necrosis or infection, unspecified: Secondary | ICD-10-CM | POA: Insufficient documentation

## 2015-05-09 DIAGNOSIS — F1010 Alcohol abuse, uncomplicated: Secondary | ICD-10-CM | POA: Diagnosis not present

## 2015-05-09 DIAGNOSIS — Z7982 Long term (current) use of aspirin: Secondary | ICD-10-CM | POA: Diagnosis not present

## 2015-05-09 LAB — CBC WITH DIFFERENTIAL/PLATELET
Basophils Absolute: 0 10*3/uL (ref 0.0–0.1)
Basophils Relative: 0 %
EOS ABS: 0.2 10*3/uL (ref 0.0–0.7)
Eosinophils Relative: 2 %
HEMATOCRIT: 40.6 % (ref 39.0–52.0)
HEMOGLOBIN: 14.3 g/dL (ref 13.0–17.0)
LYMPHS ABS: 1.9 10*3/uL (ref 0.7–4.0)
Lymphocytes Relative: 20 %
MCH: 36 pg — AB (ref 26.0–34.0)
MCHC: 35.2 g/dL (ref 30.0–36.0)
MCV: 102.3 fL — AB (ref 78.0–100.0)
MONOS PCT: 5 %
Monocytes Absolute: 0.5 10*3/uL (ref 0.1–1.0)
NEUTROS PCT: 73 %
Neutro Abs: 6.9 10*3/uL (ref 1.7–7.7)
Platelets: 126 10*3/uL — ABNORMAL LOW (ref 150–400)
RBC: 3.97 MIL/uL — ABNORMAL LOW (ref 4.22–5.81)
RDW: 14.4 % (ref 11.5–15.5)
WBC: 9.4 10*3/uL (ref 4.0–10.5)

## 2015-05-09 LAB — COMPREHENSIVE METABOLIC PANEL
ALK PHOS: 100 U/L (ref 38–126)
ALT: 13 U/L — ABNORMAL LOW (ref 17–63)
AST: 32 U/L (ref 15–41)
Albumin: 4.2 g/dL (ref 3.5–5.0)
Anion gap: 9 (ref 5–15)
BILIRUBIN TOTAL: 0.8 mg/dL (ref 0.3–1.2)
BUN: 9 mg/dL (ref 6–20)
CALCIUM: 8.5 mg/dL — AB (ref 8.9–10.3)
CO2: 26 mmol/L (ref 22–32)
CREATININE: 1.05 mg/dL (ref 0.61–1.24)
Chloride: 103 mmol/L (ref 101–111)
GFR calc Af Amer: >60 mL/min (ref >60)
GFR calc non Af Amer: >60 mL/min (ref >60)
GLUCOSE: 122 mg/dL — AB (ref 65–99)
Potassium: 3.8 mmol/L (ref 3.5–5.1)
SODIUM: 138 mmol/L (ref 135–145)
TOTAL PROTEIN: 7.8 g/dL (ref 6.5–8.1)

## 2015-05-09 NOTE — Patient Instructions (Addendum)
[place] at [facility] Discharge Instructions  RECOMMENDATIONS MADE BY THE CONSULTANT AND ANY TEST RESULTS WILL BE SENT TO YOUR REFERRING PHYSICIAN.  Exam done and seen today by Kirby Crigler Scans had looked better that you had in December. You have your port placed now. Will need to get port flushes every 2 months Try to do the Aleve with the [facility] powders Return to see the Doctor in 3 months Labs in 3 months CT scan of Chest, Abdomen, pelvis in 3 months Call the clinic for any concerns or questions.  Thank you for choosing [place] at [facility] to provide your oncology and hematology care.  To afford each patient quality time with our provider, please arrive at least 15 minutes before your scheduled appointment time.   Beginning January 23rd 2017 lab work for the [facility] will be done in the  Main lab at [facility] on 1st floor. If you have a lab appointment with the [facility] please come in thru the  Main Entrance and check in at the main information desk  You need to re-schedule your appointment should you arrive 10 or more minutes late.  We strive to give you quality time with our providers, and arriving late affects you and other patients whose appointments are after yours.  Also, if you no show three or more times for appointments you may be dismissed from the clinic at the providers discretion.     Again, thank you for choosing [facility].  Our hope is that these requests will decrease the amount of time that you wait before being seen by our physicians.       _____________________________________________________________  Should you have questions after your visit to [facility], please contact our office at [phone] between the hours of 8:30 a.m. and 4:30 p.m.  Voicemails left after 4:30 p.m. will not be returned until the following business day.  For prescription refill requests, have your  pharmacy contact our office.         Resources For Cancer Patients and their Caregivers ? American Cancer Society: Can assist with transportation, wigs, general needs, runs Look Good Feel Better.        680 044 3010 ? Cancer Care: Provides financial assistance, online support groups, medication/co-pay assistance.  1-800-813-HOPE 978 645 7135) ? [place] Assists [place] cancer patients and their families through emotional , educational and financial support.  226 522 4237 ? [place] DSS Where to apply for food stamps, Medicaid and utility assistance. 314 164 2005 ? [employer]: Transportation to medical appointments. 504-818-2342 ? Social Security Administration: May apply for disability if have a Stage IV cancer. 931-655-3291 850-358-8152 ? [employer], Disability and Transit Services: Assists with nutrition, care and transit needs. [phone]

## 2015-05-31 ENCOUNTER — Other Ambulatory Visit (HOSPITAL_COMMUNITY): Payer: Self-pay | Admitting: Oncology

## 2015-06-02 ENCOUNTER — Other Ambulatory Visit (HOSPITAL_COMMUNITY): Payer: Self-pay | Admitting: Oncology

## 2015-06-02 DIAGNOSIS — C49A0 Gastrointestinal stromal tumor, unspecified site: Secondary | ICD-10-CM

## 2015-06-02 MED ORDER — OMEPRAZOLE 40 MG PO CPDR: 40.0000 mg | DELAYED_RELEASE_CAPSULE | Freq: Every day | ORAL | Status: DC

## 2015-06-02 MED ORDER — BUSPIRONE HCL 5 MG PO TABS: 5.0000 mg | ORAL_TABLET | Freq: Three times a day (TID) | ORAL | Status: DC

## 2015-06-02 MED ORDER — MIRTAZAPINE 45 MG PO TABS: 45.0000 mg | ORAL_TABLET | Freq: Every evening | ORAL | Status: DC | PRN

## 2015-07-05 ENCOUNTER — Encounter (HOSPITAL_COMMUNITY): Payer: Medicaid Other | Attending: Hematology & Oncology

## 2015-07-05 DIAGNOSIS — C49A3 Gastrointestinal stromal tumor of small intestine: Secondary | ICD-10-CM | POA: Insufficient documentation

## 2015-07-05 DIAGNOSIS — Z9889 Other specified postprocedural states: Secondary | ICD-10-CM | POA: Insufficient documentation

## 2015-07-05 DIAGNOSIS — Z7982 Long term (current) use of aspirin: Secondary | ICD-10-CM | POA: Insufficient documentation

## 2015-07-05 DIAGNOSIS — F1010 Alcohol abuse, uncomplicated: Secondary | ICD-10-CM | POA: Insufficient documentation

## 2015-07-05 DIAGNOSIS — Z95828 Presence of other vascular implants and grafts: Secondary | ICD-10-CM

## 2015-07-05 DIAGNOSIS — K859 Acute pancreatitis without necrosis or infection, unspecified: Secondary | ICD-10-CM | POA: Insufficient documentation

## 2015-07-05 DIAGNOSIS — Z85068 Personal history of other malignant neoplasm of small intestine: Secondary | ICD-10-CM | POA: Insufficient documentation

## 2015-07-05 DIAGNOSIS — Z79899 Other long term (current) drug therapy: Secondary | ICD-10-CM | POA: Insufficient documentation

## 2015-07-05 DIAGNOSIS — Z452 Encounter for adjustment and management of vascular access device: Secondary | ICD-10-CM | POA: Diagnosis present

## 2015-07-05 DIAGNOSIS — K219 Gastro-esophageal reflux disease without esophagitis: Secondary | ICD-10-CM | POA: Insufficient documentation

## 2015-07-05 DIAGNOSIS — F319 Bipolar disorder, unspecified: Secondary | ICD-10-CM | POA: Insufficient documentation

## 2015-07-05 MED ORDER — LIDOCAINE-PRILOCAINE 2.5-2.5 % EX CREA: 1.0000 "application " | TOPICAL_CREAM | CUTANEOUS | Status: AC | PRN

## 2015-07-05 MED ORDER — SODIUM CHLORIDE 0.9% FLUSH
10.0000 mL | INTRAVENOUS | Status: DC | PRN
  Administered 2015-07-05: 10 mL via INTRAVENOUS
  Filled 2015-07-05: qty 10

## 2015-07-05 MED ORDER — HEPARIN SOD (PORK) LOCK FLUSH 100 UNIT/ML IV SOLN
INTRAVENOUS | Status: AC
  Filled 2015-07-05: qty 5

## 2015-07-05 MED ORDER — HEPARIN SOD (PORK) LOCK FLUSH 100 UNIT/ML IV SOLN
500.0000 [IU] | Freq: Once | INTRAVENOUS | Status: AC
  Administered 2015-07-05: 500 [IU] via INTRAVENOUS

## 2015-07-05 NOTE — Progress Notes (Signed)
Emla cream prescription sent to [facility] per Dr. Whitney Muse

## 2015-07-27 ENCOUNTER — Other Ambulatory Visit (HOSPITAL_COMMUNITY): Payer: Self-pay | Admitting: Oncology

## 2015-07-27 DIAGNOSIS — C49A9 Gastrointestinal stromal tumor of other sites: Secondary | ICD-10-CM

## 2015-07-27 MED ORDER — IMATINIB MESYLATE 400 MG PO TABS: 400.0000 mg | ORAL_TABLET | Freq: Every day | ORAL | Status: DC

## 2015-07-28 ENCOUNTER — Telehealth (HOSPITAL_COMMUNITY): Payer: Self-pay | Admitting: *Deleted

## 2015-07-28 NOTE — Telephone Encounter (Signed)
Tried to call back [place] from [employer], left her a message due to no answer.

## 2015-07-29 ENCOUNTER — Telehealth (HOSPITAL_COMMUNITY): Payer: Self-pay

## 2015-07-29 ENCOUNTER — Other Ambulatory Visit (HOSPITAL_COMMUNITY): Payer: Self-pay | Admitting: Oncology

## 2015-07-29 DIAGNOSIS — C49A9 Gastrointestinal stromal tumor of other sites: Secondary | ICD-10-CM

## 2015-07-29 MED ORDER — IMATINIB MESYLATE 400 MG PO TABS: 400.0000 mg | ORAL_TABLET | Freq: Every day | ORAL | Status: DC

## 2015-07-29 NOTE — Telephone Encounter (Signed)
[name] from [facility] called stating patient was requesting a home health aid to help him. He would like to discuss this at his next appointment with her.

## 2015-08-02 ENCOUNTER — Other Ambulatory Visit (HOSPITAL_COMMUNITY): Payer: Self-pay | Admitting: Oncology

## 2015-08-02 DIAGNOSIS — C49A0 Gastrointestinal stromal tumor, unspecified site: Secondary | ICD-10-CM

## 2015-08-02 MED ORDER — MIRTAZAPINE 45 MG PO TABS: 45.0000 mg | ORAL_TABLET | Freq: Every evening | ORAL | Status: DC | PRN

## 2015-08-03 ENCOUNTER — Ambulatory Visit (HOSPITAL_COMMUNITY): Payer: Medicaid Other

## 2015-08-04 ENCOUNTER — Telehealth (HOSPITAL_COMMUNITY): Payer: Self-pay | Admitting: Oncology

## 2015-08-04 NOTE — Telephone Encounter (Signed)
Peer to peer review completed on 08/04/2015 at 1333 hrs. for CT of chest with contrast. This is approved. Approval code: [id number].  Robynn Pane, PA-C 08/04/2015 1:33 PM

## 2015-08-08 ENCOUNTER — Ambulatory Visit (HOSPITAL_COMMUNITY): Admission: RE | Admit: 2015-08-08 | Payer: Medicaid Other | Source: Ambulatory Visit

## 2015-08-08 ENCOUNTER — Ambulatory Visit (HOSPITAL_COMMUNITY)
Admission: RE | Admit: 2015-08-08 | Discharge: 2015-08-08 | Disposition: A | Discharge status: Home / Self Care | Payer: Medicaid Other | Source: Ambulatory Visit | Attending: Oncology | Admitting: Oncology

## 2015-08-08 DIAGNOSIS — C49A3 Gastrointestinal stromal tumor of small intestine: Secondary | ICD-10-CM | POA: Diagnosis present

## 2015-08-08 MED ORDER — IOPAMIDOL (ISOVUE-300) INJECTION 61%
100.0000 mL | Freq: Once | INTRAVENOUS | Status: AC | PRN
  Administered 2015-08-08: 100 mL via INTRAVENOUS

## 2015-08-10 ENCOUNTER — Other Ambulatory Visit (HOSPITAL_COMMUNITY): Payer: Medicaid Other

## 2015-08-10 ENCOUNTER — Ambulatory Visit (HOSPITAL_COMMUNITY): Payer: Medicaid Other | Admitting: Hematology & Oncology

## 2015-08-27 NOTE — Progress Notes (Signed)
This encounter was created in error - please disregard.

## 2015-09-29 ENCOUNTER — Ambulatory Visit (HOSPITAL_COMMUNITY): Payer: Medicaid Other | Admitting: Oncology

## 2015-09-29 ENCOUNTER — Other Ambulatory Visit (HOSPITAL_COMMUNITY): Payer: Medicaid Other

## 2015-11-07 ENCOUNTER — Other Ambulatory Visit (HOSPITAL_COMMUNITY): Payer: Medicaid Other

## 2015-11-07 ENCOUNTER — Ambulatory Visit (HOSPITAL_COMMUNITY): Payer: Medicaid Other | Admitting: Hematology & Oncology

## 2015-11-21 ENCOUNTER — Other Ambulatory Visit (HOSPITAL_COMMUNITY): Payer: Self-pay | Admitting: Emergency Medicine

## 2015-11-21 DIAGNOSIS — C49A9 Gastrointestinal stromal tumor of other sites: Secondary | ICD-10-CM

## 2015-11-21 MED ORDER — IMATINIB MESYLATE 400 MG PO TABS: 400.0000 mg | ORAL_TABLET | Freq: Every day | ORAL | 2 refills | Status: DC

## 2015-11-21 NOTE — Progress Notes (Signed)
Refilled Gleevec

## 2015-12-19 ENCOUNTER — Other Ambulatory Visit (HOSPITAL_COMMUNITY): Payer: Self-pay | Admitting: *Deleted

## 2015-12-19 DIAGNOSIS — C49A3 Gastrointestinal stromal tumor of small intestine: Secondary | ICD-10-CM

## 2015-12-20 ENCOUNTER — Encounter (HOSPITAL_COMMUNITY): Payer: Medicaid Other | Admitting: Hematology & Oncology

## 2015-12-20 ENCOUNTER — Other Ambulatory Visit (HOSPITAL_COMMUNITY): Payer: Medicaid Other

## 2015-12-21 ENCOUNTER — Telehealth (HOSPITAL_COMMUNITY): Payer: Self-pay | Admitting: *Deleted

## 2015-12-30 ENCOUNTER — Other Ambulatory Visit (HOSPITAL_COMMUNITY): Payer: Self-pay | Admitting: Oncology

## 2015-12-30 ENCOUNTER — Encounter (HOSPITAL_COMMUNITY): Payer: Medicaid Other | Attending: Hematology & Oncology

## 2015-12-30 DIAGNOSIS — Z452 Encounter for adjustment and management of vascular access device: Secondary | ICD-10-CM | POA: Diagnosis not present

## 2015-12-30 DIAGNOSIS — C49A3 Gastrointestinal stromal tumor of small intestine: Secondary | ICD-10-CM

## 2015-12-30 DIAGNOSIS — E876 Hypokalemia: Secondary | ICD-10-CM

## 2015-12-30 LAB — CBC WITH DIFFERENTIAL/PLATELET
Basophils Absolute: 0.1 10*3/uL (ref 0.0–0.1)
Basophils Relative: 1 %
EOS ABS: 0.2 10*3/uL (ref 0.0–0.7)
EOS PCT: 2 %
HCT: 37.2 % — ABNORMAL LOW (ref 39.0–52.0)
HEMOGLOBIN: 12.6 g/dL — AB (ref 13.0–17.0)
LYMPHS ABS: 1.6 10*3/uL (ref 0.7–4.0)
Lymphocytes Relative: 22 %
MCH: 36 pg — AB (ref 26.0–34.0)
MCHC: 33.9 g/dL (ref 30.0–36.0)
MCV: 106.3 fL — AB (ref 78.0–100.0)
MONOS PCT: 10 %
Monocytes Absolute: 0.7 10*3/uL (ref 0.1–1.0)
NEUTROS PCT: 65 %
Neutro Abs: 4.6 10*3/uL (ref 1.7–7.7)
Platelets: 186 10*3/uL (ref 150–400)
RBC: 3.5 MIL/uL — ABNORMAL LOW (ref 4.22–5.81)
RDW: 14.8 % (ref 11.5–15.5)
WBC: 7.1 10*3/uL (ref 4.0–10.5)

## 2015-12-30 LAB — COMPREHENSIVE METABOLIC PANEL
ALBUMIN: 3.6 g/dL (ref 3.5–5.0)
ALK PHOS: 71 U/L (ref 38–126)
ALT: 49 U/L (ref 17–63)
ANION GAP: 8 (ref 5–15)
AST: 63 U/L — AB (ref 15–41)
BILIRUBIN TOTAL: 0.5 mg/dL (ref 0.3–1.2)
BUN: 10 mg/dL (ref 6–20)
CO2: 27 mmol/L (ref 22–32)
Calcium: 8.2 mg/dL — ABNORMAL LOW (ref 8.9–10.3)
Chloride: 102 mmol/L (ref 101–111)
Creatinine, Ser: 1.07 mg/dL (ref 0.61–1.24)
GFR calc Af Amer: >60 mL/min (ref >60)
GFR calc non Af Amer: >60 mL/min (ref >60)
GLUCOSE: 85 mg/dL (ref 65–99)
POTASSIUM: 2.8 mmol/L — AB (ref 3.5–5.1)
SODIUM: 137 mmol/L (ref 135–145)
TOTAL PROTEIN: 6.8 g/dL (ref 6.5–8.1)

## 2015-12-30 MED ORDER — HEPARIN SOD (PORK) LOCK FLUSH 100 UNIT/ML IV SOLN
INTRAVENOUS | Status: AC
  Filled 2015-12-30: qty 5

## 2015-12-30 MED ORDER — SODIUM CHLORIDE 0.9% FLUSH
20.0000 mL | INTRAVENOUS | Status: DC | PRN
  Administered 2015-12-30: 20 mL via INTRAVENOUS
  Filled 2015-12-30: qty 20

## 2015-12-30 MED ORDER — POTASSIUM CHLORIDE CRYS ER 20 MEQ PO TBCR: 40.0000 meq | EXTENDED_RELEASE_TABLET | Freq: Every day | ORAL | 0 refills | Status: DC

## 2015-12-30 MED ORDER — HEPARIN SOD (PORK) LOCK FLUSH 100 UNIT/ML IV SOLN
500.0000 [IU] | Freq: Once | INTRAVENOUS | Status: AC
  Administered 2015-12-30: 500 [IU] via INTRAVENOUS

## 2015-12-30 NOTE — Patient Instructions (Signed)
[place] at [facility] Discharge Instructions  RECOMMENDATIONS MADE BY THE CONSULTANT AND ANY TEST RESULTS WILL BE SENT TO YOUR REFERRING PHYSICIAN.  Portacath flushed per protocol after labs drawn thru port today. Follow-up as scheduled. Call clinic for any questions or concerns  Thank you for choosing [place] at [facility] to provide your oncology and hematology care.  To afford each patient quality time with our provider, please arrive at least 15 minutes before your scheduled appointment time.   Beginning January 23rd 2017 lab work for the [facility] will be done in the  Main lab at [facility] on 1st floor. If you have a lab appointment with the [facility] please come in thru the  Main Entrance and check in at the main information desk  You need to re-schedule your appointment should you arrive 10 or more minutes late.  We strive to give you quality time with our providers, and arriving late affects you and other patients whose appointments are after yours.  Also, if you no show three or more times for appointments you may be dismissed from the clinic at the providers discretion.     Again, thank you for choosing [facility].  Our hope is that these requests will decrease the amount of time that you wait before being seen by our physicians.       _____________________________________________________________  Should you have questions after your visit to [facility], please contact our office at [phone] between the hours of 8:30 a.m. and 4:30 p.m.  Voicemails left after 4:30 p.m. will not be returned until the following business day.  For prescription refill requests, have your pharmacy contact our office.         Resources For Cancer Patients and their Caregivers ? American Cancer Society: Can assist with transportation, wigs, general needs, runs Look Good Feel Better.         870-659-9867 ? Cancer Care: Provides financial assistance, online support groups, medication/co-pay assistance.  1-800-813-HOPE 319-590-7408) ? [place] Assists [place] cancer patients and their families through emotional , educational and financial support.  347-375-0368 ? [place] DSS Where to apply for food stamps, Medicaid and utility assistance. 509-498-4398 ? [employer]: Transportation to medical appointments. (224)670-1782 ? Social Security Administration: May apply for disability if have a Stage IV cancer. 641-234-2414 309-025-7164 ? [employer], Disability and Transit Services: Assists with nutrition, care and transit needs. [place] Support Programs: @10RELATIVEDAYS @ > Cancer Support Group  2nd Tuesday of the month 1pm-2pm, Journey Room  > Creative Journey  3rd Tuesday of the month 1130am-1pm, Journey Room  > Look Good Feel Better  1st Wednesday of the month 10am-12 noon, Journey Room (Call [facility] to register [phone])

## 2015-12-30 NOTE — Progress Notes (Signed)
[name] tolerated port flush with labs draw well without complaints or incident. Port flushed per protocol. VSS Pt instructed that he has to keep his next MD appt in order to get Gleevac refilled per MD. Pt verbalized understanding. Pt discharged self ambulatory in satisfactory condition

## 2016-01-30 ENCOUNTER — Telehealth (HOSPITAL_COMMUNITY): Payer: Self-pay

## 2016-01-30 ENCOUNTER — Other Ambulatory Visit (HOSPITAL_COMMUNITY): Payer: Self-pay | Admitting: Oncology

## 2016-01-30 DIAGNOSIS — C49A9 Gastrointestinal stromal tumor of other sites: Secondary | ICD-10-CM

## 2016-01-30 MED ORDER — IMATINIB MESYLATE 400 MG PO TABS: 400.0000 mg | ORAL_TABLET | Freq: Every day | ORAL | 2 refills | Status: DC

## 2016-01-30 NOTE — Telephone Encounter (Signed)
Patient called requesting a refill on his Gleevec. He stated Dr. Whitney Muse would not approve it but after reviewing with her, she states she will approve his [place]. Patient notified and verbalized understanding.

## 2016-02-02 ENCOUNTER — Ambulatory Visit (HOSPITAL_COMMUNITY): Payer: Medicaid Other | Admitting: Hematology & Oncology

## 2016-02-23 ENCOUNTER — Encounter (HOSPITAL_COMMUNITY): Payer: Medicaid Other

## 2016-03-07 ENCOUNTER — Encounter (HOSPITAL_COMMUNITY): Payer: Medicaid Other

## 2016-03-07 ENCOUNTER — Encounter (HOSPITAL_COMMUNITY): Payer: Self-pay | Admitting: Hematology & Oncology

## 2016-03-07 ENCOUNTER — Encounter (HOSPITAL_COMMUNITY): Payer: Medicaid Other | Attending: Hematology & Oncology | Admitting: Hematology & Oncology

## 2016-03-07 VITALS — BP 141/80 | HR 93 | Temp 98.9°F | Resp 18 | Wt 205.2 lb

## 2016-03-07 DIAGNOSIS — E876 Hypokalemia: Secondary | ICD-10-CM

## 2016-03-07 DIAGNOSIS — R1310 Dysphagia, unspecified: Secondary | ICD-10-CM

## 2016-03-07 DIAGNOSIS — F1910 Other psychoactive substance abuse, uncomplicated: Secondary | ICD-10-CM

## 2016-03-07 DIAGNOSIS — K219 Gastro-esophageal reflux disease without esophagitis: Secondary | ICD-10-CM

## 2016-03-07 DIAGNOSIS — Z95828 Presence of other vascular implants and grafts: Secondary | ICD-10-CM

## 2016-03-07 DIAGNOSIS — C49A4 Gastrointestinal stromal tumor of large intestine: Secondary | ICD-10-CM

## 2016-03-07 DIAGNOSIS — C49A0 Gastrointestinal stromal tumor, unspecified site: Secondary | ICD-10-CM | POA: Diagnosis present

## 2016-03-07 DIAGNOSIS — C49A3 Gastrointestinal stromal tumor of small intestine: Secondary | ICD-10-CM

## 2016-03-07 MED ORDER — POTASSIUM CHLORIDE CRYS ER 20 MEQ PO TBCR: 40.0000 meq | EXTENDED_RELEASE_TABLET | Freq: Every day | ORAL | 2 refills | Status: DC

## 2016-03-07 MED ORDER — SODIUM CHLORIDE 0.9% FLUSH: 10.0000 mL | INTRAVENOUS | Status: DC | PRN

## 2016-03-07 MED ORDER — OMEPRAZOLE 40 MG PO CPDR: 40.0000 mg | DELAYED_RELEASE_CAPSULE | Freq: Every day | ORAL | 2 refills | Status: DC

## 2016-03-07 MED ORDER — HEPARIN SOD (PORK) LOCK FLUSH 100 UNIT/ML IV SOLN
500.0000 [IU] | Freq: Once | INTRAVENOUS | Status: DC
  Filled 2016-03-07: qty 5

## 2016-03-07 NOTE — Progress Notes (Signed)
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 He has been followed at [facility] and previously at [facility].   [name] returns to the [facility] alone today. He was last seen in March 2017, since then he has missed several appointments. He looks fairly good today.  He feels good today. He has been eating well and has gained some weight. He has been taking his Gleevec every day. He says that he is clean. The last time he used was on New Years, but he notes that was the first time in a long time and it was the company that came around. He is still smoking 1 ppd and drinks beer on occasion.   [place] and hurt his left arm and head. He had a head CT done at [facility]. He brought this report with him today. His port was cleaned while he was at [facility] 2 weeks ago.   He is having very severe heart burn. He says it feels like a heart attack. He is taking Prilosec, but it isn't giving him any relief. Eating things like bread make it worse. He says it feels like the food is stuck in his chest.   He didn't get his flu shot this year.  He denies abdominal pain. No difficulties with his bowels. No problems with his bladder.    MEDICAL HISTORY:  Past Medical History:  Diagnosis Date  . Alcohol abuse   . Anemia   . GERD (gastroesophageal reflux disease)   . GIST (gastrointestinal  stroma tumor), malignant, colon ([place])   . Hx of bipolar disorder   . Malignant GIST ([place]) 09/23/2014  . Pancreatitis   . PE (physical exam), annual   . Tobacco abuse   . Ventral hernia     SURGICAL HISTORY: Past Surgical History:  Procedure Laterality Date  . COLECTOMY    . HERNIA REPAIR    . PORTACATH PLACEMENT Right 03/14/2015   Procedure: INSERTION PORT-A-CATH;  Surgeon: Aviva Signs, MD;  Location: [facility];  Service: General;  Laterality: Right;    SOCIAL HISTORY: Social History   Social History  . Marital status: Single    Spouse name: [name]  . Number of children: [name]  . Years of education: [name]   Occupational History  . Not on file.   Social History Main Topics  . Smoking status: Current Every Day Smoker    Packs/day: 1.00    Years: 36.00    Types: Cigarettes  . Smokeless tobacco: Never Used  . Alcohol use 14.4 oz/week    24 Cans of beer per week     Comment: 2 - 3 cans beer daily  . Drug use: Yes    Types: Cocaine     Comment: yesterday  . Sexual activity: Not Currently   Other Topics Concern  . Not on file   Social History Narrative  . No narrative on file  Divorced. 1 son, 23 yo,  who lives in [place] 1 grandchild in [place] he has never met. Smoker, used to smoke 3 ppd and now down to less than 1 ppd. Started around 87 or 56 yo Ex cocaine user, says he hasn't used "in a pretty good while" ETOH, he doesn't drink liquor or  go to bars anymore, he now drinks 2 cheap beers a day. He drank liquor heavily for about 10 years.  FAMILY HISTORY: Family History  Problem Relation Age of Onset  . Cancer Mother   . Hypertension Father   . Cancer Brother    indicated that his mother is alive. He indicated that his father is alive. He indicated that his brother is deceased.   Mother living about 94 yo, breast cancer. Father living about 43 yo, had a triple bypass about one year ago. 1 sister recently moved to [place], she is healthy 1 brother died from  cancer at 78 yo, "something in his spine that went to his brain" 1 brother, healthy  ALLERGIES:  has No Known Allergies.  MEDICATIONS:  Current Outpatient Prescriptions  Medication Sig Dispense Refill  . Aspirin-Caffeine 845-65 MG PACK Take 1 packet by mouth as needed.    . busPIRone (BUSPAR) 5 MG tablet Take 1 tablet (5 mg total) by mouth 3 (three) times daily. 90 tablet 1  . imatinib (GLEEVEC) 400 MG tablet Take 1 tablet (400 mg total) by mouth daily. 30 tablet 2  . lidocaine-prilocaine (EMLA) cream Apply 1 application topically as needed. 30 g 0  . mirtazapine (REMERON) 45 MG tablet Take 1 tablet (45 mg total) by mouth at bedtime as needed and may repeat dose one time if needed. 30 tablet 1  . omeprazole (PRILOSEC) 40 MG capsule Take 1 capsule (40 mg total) by mouth daily. 30 capsule 2  . oxyCODONE-acetaminophen (PERCOCET) 7.5-325 MG tablet Take 1-2 tablets by mouth every 4 (four) hours as needed. (Patient not taking: Reported on 05/09/2015) 40 tablet 0  . potassium chloride SA (K-DUR,KLOR-CON) 20 MEQ tablet Take 2 tablets (40 mEq total) by mouth daily. 60 tablet 2   No current facility-administered medications for this visit.     Review of Systems  Constitutional: Negative.   HENT: Negative.   Eyes: Negative.   Respiratory: Negative.   Cardiovascular: Positive for chest pain.  Gastrointestinal: Positive for heartburn (severe).  Genitourinary: Negative.   Musculoskeletal: Negative.   Skin: Negative.   Neurological: Negative.   Endo/Heme/Allergies: Negative.   Psychiatric/Behavioral: Negative.   All other systems reviewed and are negative. 14 point ROS was done and is otherwise as detailed above or in HPI   PHYSICAL EXAMINATION: ECOG PERFORMANCE STATUS: 1 - Symptomatic but completely ambulatory  Vitals:   03/07/16 1043  BP: (!) 141/80  Pulse: 93  Resp: 18  Temp: 98.9 F (37.2 C)   Filed Weights   03/07/16 1043  Weight: 205 lb 3.2 oz (93.1 kg)   Physical Exam    Constitutional: He is oriented to person, place, and time and well-developed, well-nourished, and in no distress.  Pt was able to get on exam table without assistance.   HENT:  Head: Normocephalic and atraumatic.  Mouth/Throat: Oropharynx is clear and moist. No oropharyngeal exudate.  Eyes: Conjunctivae and EOM are normal. Pupils are equal, round, and reactive to light. Right eye exhibits no discharge. Left eye exhibits no discharge. No scleral icterus.  Neck: Normal range of motion. Neck supple.  Cardiovascular: Normal rate, regular rhythm and normal heart sounds.   Pulmonary/Chest: Effort  normal and breath sounds normal.  Abdominal: Soft. Bowel sounds are normal. He exhibits no distension and no mass. There is no tenderness. There is no rebound and no guarding.  Musculoskeletal: Normal range of motion.  Lymphadenopathy:    He has no cervical adenopathy.  Neurological: He is alert and oriented to person, place, and time. No cranial nerve deficit. Gait normal.  Skin: Skin is warm and dry.  Psychiatric: Mood, memory and affect normal.  Nursing note and vitals reviewed.   LABORATORY DATA:  I have reviewed the data as listed  CBC    Component Value Date/Time   WBC 7.1 12/30/2015 0951   RBC 3.50 (L) 12/30/2015 0951   HGB 12.6 (L) 12/30/2015 0951   HCT 37.2 (L) 12/30/2015 0951   PLT 186 12/30/2015 0951   MCV 106.3 (H) 12/30/2015 0951   MCH 36.0 (H) 12/30/2015 0951   MCHC 33.9 12/30/2015 0951   RDW 14.8 12/30/2015 0951   LYMPHSABS 1.6 12/30/2015 0951   MONOABS 0.7 12/30/2015 0951   EOSABS 0.2 12/30/2015 0951   BASOSABS 0.1 12/30/2015 0951    CMP     Component Value Date/Time   NA 137 12/30/2015 0951   K 2.8 (L) 12/30/2015 0951   CL 102 12/30/2015 0951   CO2 27 12/30/2015 0951   GLUCOSE 85 12/30/2015 0951   BUN 10 12/30/2015 0951   CREATININE 1.50 (H) 03/14/2016 1303   CALCIUM 8.2 (L) 12/30/2015 0951   PROT 6.8 12/30/2015 0951   ALBUMIN 3.6 12/30/2015 0951   AST 63 (H)  12/30/2015 0951   ALT 49 12/30/2015 0951   ALKPHOS 71 12/30/2015 0951   BILITOT 0.5 12/30/2015 0951   GFRNONAA >60 12/30/2015 0951   GFRAA >60 12/30/2015 0951   Urine rapid drug screen (hosp performed)  Status: Finalresult Visible to patient:  Not Released Nextappt: 11/09/2014 at 11:00 AM in Oncology Robynn Pane, PA-C)           Notes Recorded by Patrici Ranks, MD on 11/04/2014 at 10:51 AM Alika's etoh level this am.... Also positive cocaine UDS. Dr.P     Ref Range 9:14 AM    Opiates NONE DETECTED  NONE DETECTED   Cocaine NONE DETECTED  POSITIVE (A)   Benzodiazepines NONE DETECTED  NONE DETECTED   Amphetamines NONE DETECTED  NONE DETECTED   Tetrahydrocannabinol NONE DETECTED  NONE DETECTED   Barbiturates NONE DETECTED  NONE DETECTED   Comments:     DRUG SCREEN FOR MEDICAL PURPOSES  ONLY. IF CONFIRMATION IS NEEDED  FOR ANY PURPOSE, NOTIFY LAB  WITHIN 5 DAYS.      LOWEST DETECTABLE LIMITS  FOR URINE DRUG SCREEN  Drug Class    Cutoff (ng/mL)  Amphetamine   1000  Barbiturate   200  Benzodiazepine  774  Tricyclics    128  Opiates     300  Cocaine     300  THC       50     Resulting Agency [facility]       Specimen Collected: 11/04/14 9:14 AM   Last Resulted: 11/04/14 10:11 AM           RADIOGRAPHIC STUDIES: I have personally reviewed the radiological images as listed and agreed with the findings in the report. Study Result   CLINICAL DATA:  Followup stage IV GIST tumor.  EXAM: CT ABDOMEN AND PELVIS WITH CONTRAST  TECHNIQUE: Multidetector CT imaging of the abdomen and pelvis was performed using the standard protocol following bolus administration of  intravenous contrast.  CONTRAST:  187m ISOVUE-300 IOPAMIDOL (ISOVUE-300) INJECTION 61%  COMPARISON:  02/10/2015  FINDINGS: Lower chest: Moderate changes of emphysema identified. No pleural or pericardial effusion.  Hepatobiliary: No  masses or other significant abnormality.  Pancreas: No mass, inflammatory changes, or other significant abnormality.  Spleen: Within normal limits in size and appearance.  Adrenals/Urinary Tract: Normal adrenal glands. Normal appearance of the kidneys. The urinary bladder is unremarkable.  Stomach/Bowel: The stomach is normal. The small bowel loops have a normal caliber. No pathologic dilatation of the large or small bowel loops. There is a ventral abdominal wall hernia which contains a nonobstructed loop of transverse colon, image 42 of series 2. Colocolonic anastomosis within the left lower quadrant of the abdomen is identified. No complications.  Vascular/Lymphatic: Calcified atherosclerotic disease involves the abdominal aorta. No aneurysm. No enlarged retroperitoneal or mesenteric adenopathy. No enlarged pelvic or inguinal lymph nodes.  Reproductive: Prostate gland and seminal vesicles are unremarkable.  Other: There is no ascites or focal fluid collections within the abdomen or pelvis. Multifocal peritoneal nodularity identified. The peritoneal implant along the descending colon measures 1.9 x 1.8 cm, image 45 of series 2. Previously 2.7 x 2.3 cm. The peritoneal implant along the undersurface of the ventral abdominal wall measures 6 mm, image 41 of series 2. Previously 9 mm. Left lower lobe soft tissue implant measures 6 mm, image 52 of series 7. Previously 1 cm.  Musculoskeletal:  No suspicious bone lesions identified.  IMPRESSION: 1. In no acute findings. Peritoneal implants are again noted compatible with trans peritoneal spread of tumor. The index lesions are stable to slightly decreased in size from previous exam. No new or progressive nodularity identified.   Electronically Signed   By: TKerby MoorsM.D.   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. Results were sent to scan. Port was flushed in the ED per the patient. We will set him up on a port flush schedule.  Order CT Abdomen. He hasn't had one since June.   Copy CT of head from [facility] into his chart, this was also sent to be scanned.   Referral to GI for EGD for his severe heartburn/dysphagia. I discussed the importance of this with the patient especially given his longterm tobacco and ETOH use.   Flu shot today.   He will return for a follow up in 6-8 weeks.   Orders Placed This Encounter  Procedures  . CT Abdomen Pelvis W Contrast    Standing Status:   Future    Number of Occurrences:   1    Standing Expiration Date:   03/07/2017    Order Specific Question:   If indicated for the ordered procedure, I authorize the administration of contrast media per Radiology protocol    Answer:   Yes    Order Specific Question:   Reason for Exam (SYMPTOM  OR DIAGNOSIS REQUIRED)    Answer:   restaging stage IV GIST    Order Specific Question:   [facility]    Standing Status:   Future  Number of Occurrences:   1    Standing Expiration Date:   03/07/2017    Order Specific Question:   If indicated for the ordered procedure, I authorize the administration of contrast media per Radiology protocol    Answer:   Yes    Order Specific Question:   Reason for Exam (SYMPTOM  OR DIAGNOSIS REQUIRED)    Answer:   restaging stage IV GIST    Order Specific Question:   Preferred imaging location?    Answer:   [facility]   All questions were answered. The patient knows to call the clinic with any problems, questions or concerns.    This document serves as a record of services personally performed by Ancil Linsey, MD. It was created on her behalf by Martinique Casey, a trained medical scribe. The creation of this record is based on the scribe's personal observations and the provider's statements to them. This document has been checked and approved by the attending provider.  I have reviewed the above documentation for accuracy and completeness, and I agree with the above.  This note was electronically signed.  Kelby Fam. Whitney Muse, MD

## 2016-03-07 NOTE — Patient Instructions (Addendum)
[place] at [facility] Discharge Instructions  RECOMMENDATIONS MADE BY THE CONSULTANT AND ANY TEST RESULTS WILL BE SENT TO YOUR REFERRING PHYSICIAN.  You were seen today by Dr. Whitney Muse Follow up in 6-8 weeks with port flush and appt We will refer you to GI I sent refills for potassium and omeprazole to pharmacy  Thank you for choosing [place] at [facility] to provide your oncology and hematology care.  To afford each patient quality time with our provider, please arrive at least 15 minutes before your scheduled appointment time.    If you have a lab appointment with the [facility] please come in thru the  Main Entrance and check in at the main information desk  You need to re-schedule your appointment should you arrive 10 or more minutes late.  We strive to give you quality time with our providers, and arriving late affects you and other patients whose appointments are after yours.  Also, if you no show three or more times for appointments you may be dismissed from the clinic at the providers discretion.     Again, thank you for choosing [facility].  Our hope is that these requests will decrease the amount of time that you wait before being seen by our physicians.       _____________________________________________________________  Should you have questions after your visit to [facility], please contact our office at [phone] between the hours of 8:30 a.m. and 4:30 p.m.  Voicemails left after 4:30 p.m. will not be returned until the following business day.  For prescription refill requests, have your pharmacy contact our office.       Resources For Cancer Patients and their Caregivers ? American Cancer Society: Can assist with transportation, wigs, general needs, runs Look Good Feel Better.        551 147 2775 ? Cancer Care: Provides financial assistance, online support groups, medication/co-pay  assistance.  1-800-813-HOPE 727-239-4272) ? [place] Assists [place] cancer patients and their families through emotional , educational and financial support.  (646) 878-9635 ? [place] DSS Where to apply for food stamps, Medicaid and utility assistance. 7318604896 ? [facility]: Transportation to medical appointments. 604-187-3538 ? Social Security Administration: May apply for disability if have a Stage IV cancer. 6036313229 801-274-5062 ? [employer], Disability and Transit Services: Assists with nutrition, care and transit needs. [place] Support Programs: @10RELATIVEDAYS @ > Cancer Support Group  2nd Tuesday of the month 1pm-2pm, Journey Room  > Creative Journey  3rd Tuesday of the month 1130am-1pm, Journey Room  > Look Good Feel Better  1st Wednesday of the month 10am-12 noon, Journey Room (Call [place] to register [phone])

## 2016-03-07 NOTE — Progress Notes (Signed)
Patient did not want port flushed today.

## 2016-03-13 ENCOUNTER — Telehealth (HOSPITAL_COMMUNITY): Payer: Self-pay | Admitting: Oncology

## 2016-03-13 NOTE — Telephone Encounter (Signed)
Peer to peer completed for CT chest for Stage IV GIST.  CT scans are approved: [id number]  Doy Mince 03/13/2016 1:39 PM

## 2016-03-14 ENCOUNTER — Ambulatory Visit (HOSPITAL_COMMUNITY)
Admission: RE | Admit: 2016-03-14 | Discharge: 2016-03-14 | Disposition: A | Discharge status: Home / Self Care | Payer: Medicaid Other | Source: Ambulatory Visit | Attending: Hematology & Oncology | Admitting: Hematology & Oncology

## 2016-03-14 DIAGNOSIS — C49A4 Gastrointestinal stromal tumor of large intestine: Secondary | ICD-10-CM

## 2016-03-14 DIAGNOSIS — J439 Emphysema, unspecified: Secondary | ICD-10-CM | POA: Diagnosis not present

## 2016-03-14 DIAGNOSIS — R918 Other nonspecific abnormal finding of lung field: Secondary | ICD-10-CM | POA: Insufficient documentation

## 2016-03-14 DIAGNOSIS — I2510 Atherosclerotic heart disease of native coronary artery without angina pectoris: Secondary | ICD-10-CM | POA: Insufficient documentation

## 2016-03-14 DIAGNOSIS — C786 Secondary malignant neoplasm of retroperitoneum and peritoneum: Secondary | ICD-10-CM | POA: Diagnosis not present

## 2016-03-14 DIAGNOSIS — I700 Atherosclerosis of aorta: Secondary | ICD-10-CM | POA: Insufficient documentation

## 2016-03-14 DIAGNOSIS — C49A3 Gastrointestinal stromal tumor of small intestine: Secondary | ICD-10-CM | POA: Insufficient documentation

## 2016-03-14 LAB — POCT I-STAT CREATININE: CREATININE: 1.5 mg/dL — AB (ref 0.61–1.24)

## 2016-03-14 MED ORDER — IOPAMIDOL (ISOVUE-300) INJECTION 61%
100.0000 mL | Freq: Once | INTRAVENOUS | Status: AC | PRN
  Administered 2016-03-14: 100 mL via INTRAVENOUS

## 2016-03-16 DIAGNOSIS — F1910 Other psychoactive substance abuse, uncomplicated: Secondary | ICD-10-CM | POA: Insufficient documentation

## 2016-03-29 ENCOUNTER — Ambulatory Visit (INDEPENDENT_AMBULATORY_CARE_PROVIDER_SITE_OTHER): Payer: Medicaid Other | Admitting: Gastroenterology

## 2016-03-29 ENCOUNTER — Other Ambulatory Visit: Payer: Self-pay

## 2016-03-29 ENCOUNTER — Encounter: Payer: Self-pay | Admitting: Gastroenterology

## 2016-03-29 DIAGNOSIS — K219 Gastro-esophageal reflux disease without esophagitis: Secondary | ICD-10-CM

## 2016-03-29 DIAGNOSIS — R1310 Dysphagia, unspecified: Secondary | ICD-10-CM | POA: Diagnosis not present

## 2016-03-29 DIAGNOSIS — R1319 Other dysphagia: Secondary | ICD-10-CM

## 2016-03-29 MED ORDER — DEXLANSOPRAZOLE 60 MG PO CPDR: 60.0000 mg | DELAYED_RELEASE_CAPSULE | Freq: Every day | ORAL | 5 refills | Status: DC

## 2016-03-29 NOTE — Progress Notes (Signed)
Primary Care Physician:  [name], MD Referring Provider: Robynn Pane, PA-C Primary Gastroenterologist:  Garfield Cornea, MD   Chief Complaint  Patient presents with  . Gastroesophageal Reflux    Prilosec not helping, states stage 4 stomach cancer    HPI:  [name] is a 56 y.o. male here for further evaluation of severe reflux at the request of Robynn Pane, PA-C at [facility] at [facility]. Patient has a history of GIST diagnosed in 2014, stage IV disease. Initially presented with abdominal pain and noted to have ruptures small bowel mass. He had exploratory laparotomy with small bowel resection and evacuation of ruptured small bowel tumor, primary repair of ventral hernia. Course was complicated by post-op abscess, PE. Final path showed T4No GIST tumor, low grade measuring 13.5cm with 3 negative nodes. Presented back in 2015 with recurrent disease and was not felt to be surgical candidate due to suspected advanced peritoneal disease at that time. Historically history of noncompliance with keeping appointments as well as with Gleevac. Prior history of cocaine abuse, alcohol abuse. Currently consuming 2 beers daily.   He has a one-year history of severe epigastric pain, stabbing quality, burning up into the chest and jaw. Nonexertional. Some relief with belching. Has not tried anything over-the-counter. Omeprazole 40 mg daily not helping. Sometimes finds it hard to swallow, feels like things just will not go down. Sometimes fluid comes back up when he tries to swallow. He has burning even with swallowing water. Bowel movements are regular. Denies melena rectal bleeding. He has chronic abdominal pain predominantly in the right midabdomen. He seldom uses BCs but would not quantify further. States he has nothing for pain. He drinks a couple bottles Pepto-Bismol daily. He was advised not to use more than labeled on the bottle.  Weight up 15 pounds in the past one year.  CT  chest/abdomen/pelvis noted as below.    Current Outpatient Prescriptions  Medication Sig Dispense Refill  . Aspirin-Caffeine 845-65 MG PACK Take 1 packet by mouth as needed.    . busPIRone (BUSPAR) 5 MG tablet Take 1 tablet (5 mg total) by mouth 3 (three) times daily. 90 tablet 1  . imatinib (GLEEVEC) 400 MG tablet Take 1 tablet (400 mg total) by mouth daily. 30 tablet 2  . lidocaine-prilocaine (EMLA) cream Apply 1 application topically as needed. 30 g 0  . mirtazapine (REMERON) 45 MG tablet Take 1 tablet (45 mg total) by mouth at bedtime as needed and may repeat dose one time if needed. 30 tablet 1  . omeprazole (PRILOSEC) 40 MG capsule Take 1 capsule (40 mg total) by mouth daily. 30 capsule 2  . potassium chloride SA (K-DUR,KLOR-CON) 20 MEQ tablet Take 2 tablets (40 mEq total) by mouth daily. 60 tablet 2  .       No current facility-administered medications for this visit.     Allergies as of 03/29/2016  . (No Known Allergies)    Past Medical History:  Diagnosis Date  . Alcohol abuse   . Anemia   . GERD (gastroesophageal reflux disease)   . GIST (gastrointestinal stroma tumor), malignant, colon ([place])   . Hx of bipolar disorder   . Malignant GIST ([place]) 09/23/2014  . Pancreatitis   . PE (physical exam), annual   . Tobacco abuse   . Ventral hernia     Past Surgical History:  Procedure Laterality Date  . HERNIA REPAIR    . PARTIAL COLECTOMY    . PORTACATH  PLACEMENT Right 03/14/2015   Procedure: INSERTION PORT-A-CATH;  Surgeon: Aviva Signs, MD;  Location: [facility];  Service: General;  Laterality: Right;  . SMALL INTESTINE SURGERY     small bowel resection for obstruction    Family History  Problem Relation Age of Onset  . Cancer Mother   . Hypertension Father   . Cancer Brother     Social History   Social History  . Marital status: Single    Spouse name: [name]  . Number of children: [name]  . Years of education: [name]   Occupational History  . Not on file.   Social  History Main Topics  . Smoking status: Current Every Day Smoker    Packs/day: 1.00    Years: 36.00    Types: Cigarettes  . Smokeless tobacco: Never Used  . Alcohol use 14.4 oz/week    24 Cans of beer per week     Comment: 2 - 3 cans beer daily  . Drug use: Yes    Types: Cocaine     Comment: yesterday  . Sexual activity: Not Currently   Other Topics Concern  . Not on file   Social History Narrative  . No narrative on file      ROS:  General: Negative for anorexia, weight loss, fever, chills, fatigue, weakness. Eyes: Negative for vision changes.  ENT: Negative for hoarseness, difficulty swallowing , nasal congestion. CV: Negative for chest pain, angina, palpitations, dyspnea on exertion, peripheral edema.  Respiratory: Negative for dyspnea at rest, dyspnea on exertion, cough, sputum, wheezing.  GI: See history of present illness. GU:  Negative for dysuria, hematuria, urinary incontinence, urinary frequency, nocturnal urination.  MS: Negative for joint pain, low back pain.  Derm: Negative for rash or itching.  Neuro: Negative for weakness, abnormal sensation, seizure, frequent headaches, memory loss, confusion.  Psych: Negative for anxiety, depression, suicidal ideation, hallucinations.  Endo: Negative for unusual weight change.  Heme: Negative for bruising or bleeding. Allergy: Negative for rash or hives.    Physical Examination:  BP 115/75   Pulse 81   Temp 98.1 F (36.7 C) (Oral)   Ht 6\' 1"  (1.854 m)   Wt 201 lb 3.2 oz (91.3 kg)   BMI 26.55 kg/m    General: Well-nourished, well-developed in no acute distress.  Head: Normocephalic, atraumatic.   Eyes: Conjunctiva pink, no icterus. Mouth: Oropharyngeal mucosa moist and pink , no lesions erythema or exudate. Neck: Supple without thyromegaly, masses, or lymphadenopathy.  Lungs: Clear to auscultation bilaterally.  Heart: Regular rate and rhythm, no murmurs rubs or gallops.  Abdomen: Bowel sounds are normal, large  mid-line incision with at least two palpable hernias, umb hernia all reducible. Patient with subjective guarding with palpation of right mid abd. no hepatosplenomegaly or masses, no abdominal bruits, no rebound or guarding.   Rectal: not performed Extremities: No lower extremity edema. No clubbing or deformities.  Neuro: Alert and oriented x 4 , grossly normal neurologically.  Skin: Warm and dry, no rash or jaundice.   Psych: Alert and cooperative, normal mood and affect.  Labs: Lab Results  Component Value Date   CREATININE 1.50 (H) 03/14/2016   BUN 10 12/30/2015   NA 137 12/30/2015   K 2.8 (L) 12/30/2015   CL 102 12/30/2015   CO2 27 12/30/2015   Lab Results  Component Value Date   ALT 49 12/30/2015   AST 63 (H) 12/30/2015   ALKPHOS 71 12/30/2015   BILITOT 0.5 12/30/2015    Lab Results  Component Value Date   WBC 7.1 12/30/2015   HGB 12.6 (L) 12/30/2015   HCT 37.2 (L) 12/30/2015   MCV 106.3 (H) 12/30/2015   PLT 186 12/30/2015    Imaging Studies: Ct Chest W Contrast  Result Date: 03/14/2016 CLINICAL DATA:  Partial small bowel resection 09/04/2012 for malignant GI stromal tumor, with stage IV metastatic disease managed with Gleevec, presenting for restaging. EXAM: CT CHEST, ABDOMEN, AND PELVIS WITH CONTRAST TECHNIQUE: Multidetector CT imaging of the chest, abdomen and pelvis was performed following the standard protocol during bolus administration of intravenous contrast. CONTRAST:  175mL ISOVUE-300 IOPAMIDOL (ISOVUE-300) INJECTION 61% COMPARISON:  08/08/2015 CT abdomen/ pelvis. 02/10/2015 CT chest, abdomen and pelvis. FINDINGS: CT CHEST FINDINGS Cardiovascular: Normal heart size. No significant pericardial fluid/thickening. Left anterior descending, left circumflex and right coronary atherosclerosis. Right subclavian MediPort terminates in the lower third of the superior cava. Atherosclerotic nonaneurysmal thoracic aorta. Normal caliber pulmonary arteries. No central pulmonary  emboli. Mediastinum/Nodes: No discrete thyroid nodules. Unremarkable esophagus. No axillary adenopathy. Newly mildly enlarged 1.0 cm subcarinal node (series 2/ image 29). Newly mildly enlarged 1.2 cm right hilar node (series 2/ image 28). No additional pathologically enlarged mediastinal or hilar nodes. Lungs/Pleura: No pneumothorax. No pleural effusion. Mild-to-moderate centrilobular and paraseptal emphysema with mild diffuse bronchial wall thickening. Irregular 1.0 cm anterior right upper lobe nodular opacity (series 4/ image 66) is new since 02/10/2015. Subpleural 4 mm basilar left lower lobe pulmonary nodule (series 4/ image 129) is stable since 03/04/2010 and considered benign. Otherwise no acute consolidative airspace disease, lung masses or additional significant pulmonary nodules. Musculoskeletal: No aggressive appearing focal osseous lesions. Mild thoracic spondylosis . CT ABDOMEN PELVIS FINDINGS Hepatobiliary: Normal liver with no liver mass. Normal gallbladder with no radiopaque cholelithiasis. No biliary ductal dilatation. Pancreas: Normal, with no mass or duct dilation. Spleen: Normal size. No mass. Adrenals/Urinary Tract: Normal adrenals. Normal kidneys with no hydronephrosis and no renal mass. Normal bladder. Stomach/Bowel: Grossly normal stomach. There is a stable large supraumbilical midline ventral abdominal hernia containing a portion of the transverse colon. Stable postsurgical changes in the proximal small bowel from prior partial small bowel resection with intact appearing enteroenterostomy in the medial left upper abdomen. Normal caliber small bowel with no small bowel wall thickening. Normal appendix. Oral contrast progresses to the distal colon. Stable postsurgical changes from partial distal colectomy with intact appearing distal colonic anastomosis. No new large bowel wall thickening or pericolonic fat stranding. Vascular/Lymphatic: Atherosclerotic nonaneurysmal abdominal aorta. Patent  portal, splenic, hepatic and renal veins. No pathologically enlarged lymph nodes in the abdomen or pelvis. Reproductive: Normal size prostate. Other: No pneumoperitoneum, ascites or focal fluid collection. Left paracolic gutter 1.5 x 1.2 cm peritoneal nodule adjacent to the descending colon (series 2/ image 80), decreased from 1.9 x 1.8 cm on 08/08/2015. Left peritoneal 0.4 cm nodule (series 2/ image 100), decreased from 0.6 cm. No additional peritoneal nodules. Musculoskeletal: No aggressive appearing focal osseous lesions. Sclerotic lesions in the bilateral pelvic girdle are stable back to 03/04/2010 and most consistent with benign bone islands. Mild lumbar spondylosis. IMPRESSION: 1. Continued reduction in size of left abdominal peritoneal metastases. No new or progressive metastatic disease in the abdomen or pelvis. 2. Solitary new irregular 1.0 cm nodular opacity in the anterior right upper lobe, highly nonspecific, potentially inflammatory, however cannot exclude pulmonary malignancy (metastatic or primary lung carcinoma). New nonspecific mild subcarinal and right hilar lymphadenopathy, cannot exclude new nodal metastases. PET-CT could be useful for further characterization / biopsy planning, as  clinically warranted. Alternatively, a follow-up chest CT with IV contrast could be obtained in 3 months. 3. Aortic atherosclerosis.  Three-vessel coronary atherosclerosis. 4. Mild to moderate emphysema with diffuse bronchial wall thickening, suggesting COPD. Electronically Signed   By: Ilona Sorrel M.D.   On: 03/14/2016 16:53   Ct Abdomen Pelvis W Contrast  Result Date: 03/14/2016 CLINICAL DATA:  Partial small bowel resection 09/04/2012 for malignant GI stromal tumor, with stage IV metastatic disease managed with Gleevec, presenting for restaging. EXAM: CT CHEST, ABDOMEN, AND PELVIS WITH CONTRAST TECHNIQUE: Multidetector CT imaging of the chest, abdomen and pelvis was performed following the standard protocol  during bolus administration of intravenous contrast. CONTRAST:  131mL ISOVUE-300 IOPAMIDOL (ISOVUE-300) INJECTION 61% COMPARISON:  08/08/2015 CT abdomen/ pelvis. 02/10/2015 CT chest, abdomen and pelvis. FINDINGS: CT CHEST FINDINGS Cardiovascular: Normal heart size. No significant pericardial fluid/thickening. Left anterior descending, left circumflex and right coronary atherosclerosis. Right subclavian MediPort terminates in the lower third of the superior cava. Atherosclerotic nonaneurysmal thoracic aorta. Normal caliber pulmonary arteries. No central pulmonary emboli. Mediastinum/Nodes: No discrete thyroid nodules. Unremarkable esophagus. No axillary adenopathy. Newly mildly enlarged 1.0 cm subcarinal node (series 2/ image 29). Newly mildly enlarged 1.2 cm right hilar node (series 2/ image 28). No additional pathologically enlarged mediastinal or hilar nodes. Lungs/Pleura: No pneumothorax. No pleural effusion. Mild-to-moderate centrilobular and paraseptal emphysema with mild diffuse bronchial wall thickening. Irregular 1.0 cm anterior right upper lobe nodular opacity (series 4/ image 66) is new since 02/10/2015. Subpleural 4 mm basilar left lower lobe pulmonary nodule (series 4/ image 129) is stable since 03/04/2010 and considered benign. Otherwise no acute consolidative airspace disease, lung masses or additional significant pulmonary nodules. Musculoskeletal: No aggressive appearing focal osseous lesions. Mild thoracic spondylosis . CT ABDOMEN PELVIS FINDINGS Hepatobiliary: Normal liver with no liver mass. Normal gallbladder with no radiopaque cholelithiasis. No biliary ductal dilatation. Pancreas: Normal, with no mass or duct dilation. Spleen: Normal size. No mass. Adrenals/Urinary Tract: Normal adrenals. Normal kidneys with no hydronephrosis and no renal mass. Normal bladder. Stomach/Bowel: Grossly normal stomach. There is a stable large supraumbilical midline ventral abdominal hernia containing a portion of  the transverse colon. Stable postsurgical changes in the proximal small bowel from prior partial small bowel resection with intact appearing enteroenterostomy in the medial left upper abdomen. Normal caliber small bowel with no small bowel wall thickening. Normal appendix. Oral contrast progresses to the distal colon. Stable postsurgical changes from partial distal colectomy with intact appearing distal colonic anastomosis. No new large bowel wall thickening or pericolonic fat stranding. Vascular/Lymphatic: Atherosclerotic nonaneurysmal abdominal aorta. Patent portal, splenic, hepatic and renal veins. No pathologically enlarged lymph nodes in the abdomen or pelvis. Reproductive: Normal size prostate. Other: No pneumoperitoneum, ascites or focal fluid collection. Left paracolic gutter 1.5 x 1.2 cm peritoneal nodule adjacent to the descending colon (series 2/ image 80), decreased from 1.9 x 1.8 cm on 08/08/2015. Left peritoneal 0.4 cm nodule (series 2/ image 100), decreased from 0.6 cm. No additional peritoneal nodules. Musculoskeletal: No aggressive appearing focal osseous lesions. Sclerotic lesions in the bilateral pelvic girdle are stable back to 03/04/2010 and most consistent with benign bone islands. Mild lumbar spondylosis. IMPRESSION: 1. Continued reduction in size of left abdominal peritoneal metastases. No new or progressive metastatic disease in the abdomen or pelvis. 2. Solitary new irregular 1.0 cm nodular opacity in the anterior right upper lobe, highly nonspecific, potentially inflammatory, however cannot exclude pulmonary malignancy (metastatic or primary lung carcinoma). New nonspecific mild subcarinal and right hilar lymphadenopathy,  cannot exclude new nodal metastases. PET-CT could be useful for further characterization / biopsy planning, as clinically warranted. Alternatively, a follow-up chest CT with IV contrast could be obtained in 3 months. 3. Aortic atherosclerosis.  Three-vessel coronary  atherosclerosis. 4. Mild to moderate emphysema with diffuse bronchial wall thickening, suggesting COPD. Electronically Signed   By: Ilona Sorrel M.D.   On: 03/14/2016 16:53

## 2016-03-29 NOTE — Patient Instructions (Addendum)
1. Upper endoscopy as scheduled. Please see separate instructions. 2. Stop omeprazole. Start Dexilant 60mg  daily. Samples provided. RX sent to your pharmacy.

## 2016-03-29 NOTE — Progress Notes (Signed)
Pt does not have anyone that could ride the [place] with him. He said that he would see if he could find someone and call us back.

## 2016-03-29 NOTE — Assessment & Plan Note (Addendum)
56 year old gentleman with history of stage IV Gist, originally diagnosed in 2014. Initially resected but had recurrent disease the following year and deemed unresectable due to suspected extensive peritoneal disease. Has been on [place] but historically has had issues with compliance. Recent CT chest, abdomen, pelvis as outlined above. Reduction in size of left abdominal peritoneal metastasis, he also has solitary new irregular 1 cm nodule in the anterior right upper lobe which is being followed. He has stable large supraumbilical midline ventral abdominal hernia containing portion of the transverse colon, stable postsurgical changes of proximal small bowel from previous resection as well as partial distal colectomy with intact anastomosis. Chronic abdominal pain specifically right-sided per patient  Patient complains one-year history of severe GERD-like symptoms. Complains of epigastric pain and burning sensation goes up the chest and into the jaw. Nonexertional. No relief on PPI. Consumes beer daily. Also uses aspirin powders at times. Offered upper endoscopy for further evaluation of symptoms. For history of dysphagia would offer esophageal dilation as well. Plan for deep sedation in the OR given chronic alcohol use, history of previous chronic narcotic use. I have discussed the risks, alternatives, benefits with regards to but not limited to the risk of reaction to medication, bleeding, infection, perforation and the patient is agreeable to proceed. Written consent to be obtained.  Switched to [employer] 60mg  daily. Samples and RX provided. Stop pantoprazole.

## 2016-03-30 NOTE — Progress Notes (Signed)
cc'ed to pcp °

## 2016-04-02 IMAGING — CR DG CHEST 1V PORT
1 series · 1 of 1 positions shown · non-contrast
Comparison: 02/15/2015

CLINICAL DATA: Port-A-Cath insertion.

EXAM:
PORTABLE CHEST 1 VIEW

[ap portable]
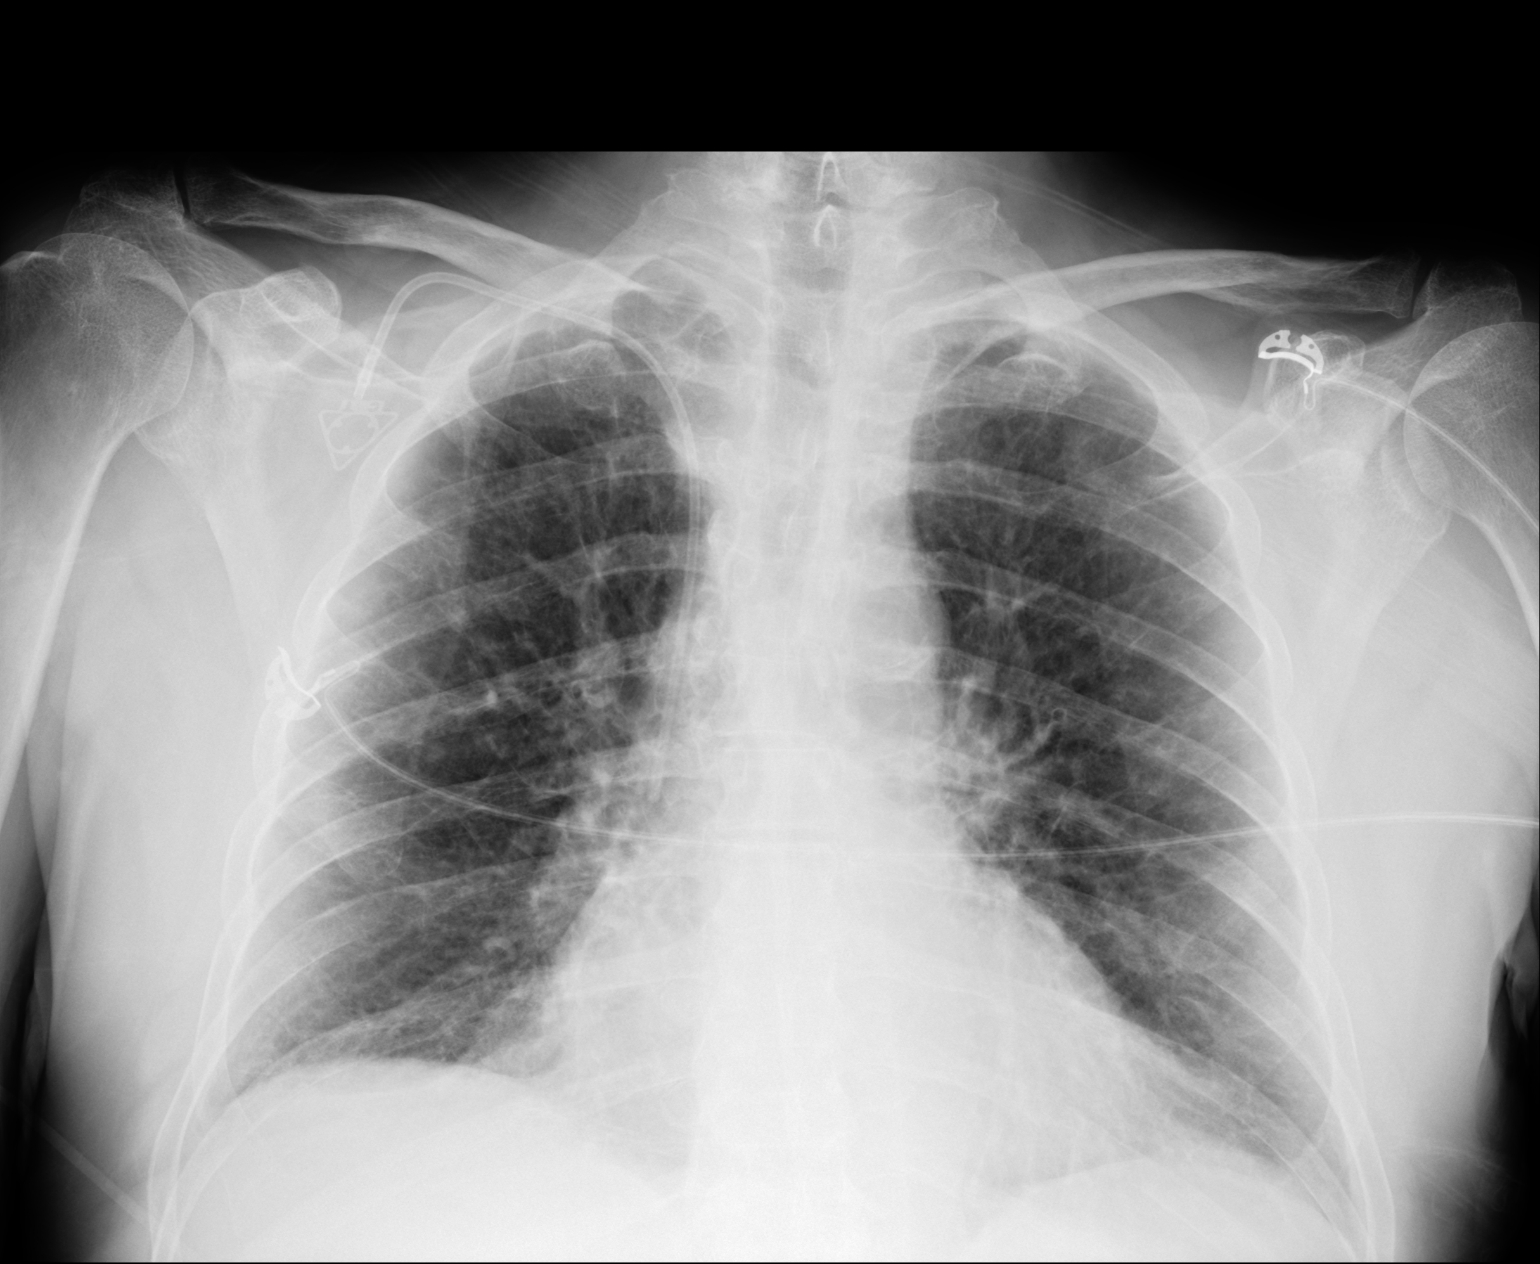

[1 of 1 positions shown; findings below may reference images not displayed]

FINDINGS: Right Port-A-Cath terminates at the low SVC. Midline trachea.
Borderline cardiomegaly. Atherosclerosis in the transverse aorta. No
pleural effusion or pneumothorax. Mild interstitial thickening,
without lobar consolidation.
IMPRESSION: Right Port-A-Cath terminating at the low SVC.

No acute cardiopulmonary disease.

Atherosclerosis.

## 2016-04-18 ENCOUNTER — Telehealth: Payer: Self-pay

## 2016-04-18 NOTE — Telephone Encounter (Signed)
Pt didn't want to schedule EGD/ED while he was here for office visit. He was to call office back. Pt hasn't called to schedule procedure. Letter mailed to pt to let him know he will need another office visit to update information before procedure can be scheduled.

## 2016-04-24 ENCOUNTER — Ambulatory Visit (HOSPITAL_COMMUNITY): Payer: Medicaid Other | Admitting: Oncology

## 2016-04-24 ENCOUNTER — Encounter (HOSPITAL_COMMUNITY): Payer: Medicaid Other

## 2016-04-26 ENCOUNTER — Other Ambulatory Visit (HOSPITAL_COMMUNITY): Payer: Self-pay | Admitting: Oncology

## 2016-04-26 DIAGNOSIS — C49A9 Gastrointestinal stromal tumor of other sites: Secondary | ICD-10-CM

## 2016-04-26 MED ORDER — IMATINIB MESYLATE 400 MG PO TABS: 400.0000 mg | ORAL_TABLET | Freq: Every day | ORAL | 2 refills | Status: DC

## 2016-04-27 ENCOUNTER — Encounter (HOSPITAL_COMMUNITY): Payer: Medicaid Other

## 2016-05-02 ENCOUNTER — Encounter (HOSPITAL_COMMUNITY): Payer: Medicaid Other

## 2016-05-29 ENCOUNTER — Encounter (HOSPITAL_COMMUNITY): Payer: Self-pay | Admitting: Oncology

## 2016-05-29 ENCOUNTER — Encounter (HOSPITAL_COMMUNITY): Payer: Medicaid Other | Attending: Oncology | Admitting: Oncology

## 2016-05-29 VITALS — BP 114/71 | HR 77 | Temp 98.8°F | Resp 18 | Ht 73.0 in | Wt 193.0 lb

## 2016-05-29 DIAGNOSIS — C49A3 Gastrointestinal stromal tumor of small intestine: Secondary | ICD-10-CM

## 2016-05-29 DIAGNOSIS — R911 Solitary pulmonary nodule: Secondary | ICD-10-CM | POA: Diagnosis not present

## 2016-05-29 DIAGNOSIS — J449 Chronic obstructive pulmonary disease, unspecified: Secondary | ICD-10-CM | POA: Diagnosis not present

## 2016-05-29 DIAGNOSIS — K219 Gastro-esophageal reflux disease without esophagitis: Secondary | ICD-10-CM

## 2016-05-29 DIAGNOSIS — C49A9 Gastrointestinal stromal tumor of other sites: Secondary | ICD-10-CM

## 2016-05-29 DIAGNOSIS — F1910 Other psychoactive substance abuse, uncomplicated: Secondary | ICD-10-CM

## 2016-05-29 DIAGNOSIS — F1010 Alcohol abuse, uncomplicated: Secondary | ICD-10-CM

## 2016-05-29 DIAGNOSIS — F419 Anxiety disorder, unspecified: Secondary | ICD-10-CM | POA: Diagnosis not present

## 2016-05-29 DIAGNOSIS — E876 Hypokalemia: Secondary | ICD-10-CM

## 2016-05-29 HISTORY — DX: Solitary pulmonary nodule: R91.1

## 2016-05-29 MED ORDER — POTASSIUM CHLORIDE CRYS ER 20 MEQ PO TBCR: 40.0000 meq | EXTENDED_RELEASE_TABLET | Freq: Every day | ORAL | 2 refills | Status: DC

## 2016-05-29 NOTE — Assessment & Plan Note (Signed)
GERD, seen by GI.  Change in PPI therapy to [place] noted and EGD recommended.

## 2016-05-29 NOTE — Assessment & Plan Note (Signed)
EtOHism, persistent.  EtOH cessation education is provided.

## 2016-05-29 NOTE — Assessment & Plan Note (Signed)
Polysubstance abuse, including cocaine.  No opiate prescribing by [facility].

## 2016-05-29 NOTE — Progress Notes (Signed)
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  INTERVAL HISTORY: [name] 56 y.o. male returns for followup of Stage IV GIST, diagnosed in 2014 and S/P partial small bowel resection on 09/04/2012 but with recurrence of disease with extensive peritoneal disease.  He was started on adjuvant Gleevac on 04/13/2013.  He has been followed by [facility] and [facility] in the past for this, but complications associated with his pain medication led to him being seen by [place].  Complicated by history of EtOH and cocaine abuse with a UDS being POSITIVE for cocaine and negative for opoid on 12/02/2014 leading to the discontinuation of opoid prescribing by [place].  Case has been complicated by noncompliance and multiple missed follow-up appointments.    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      02/10/2015 Imaging    CT CAP-  Mildly reduced size of left abdominal tumor implants. No new tumor implants identified.      03/14/2015 Procedure    Port-a-cath placed by Dr. Arnoldo Morale      03/14/2016 Imaging    CT CAP- 1. Continued reduction in size of left abdominal peritoneal metastases. No new or progressive metastatic disease  in the abdomen or pelvis. 2. Solitary new irregular 1.0 cm nodular opacity in the anterior right upper lobe, highly nonspecific, potentially inflammatory, however cannot exclude pulmonary malignancy (metastatic or primary lung carcinoma). New nonspecific mild subcarinal and right hilar lymphadenopathy, cannot exclude new nodal metastases. PET-CT could be useful for further characterization / biopsy planning, as clinically warranted. Alternatively, a follow-up chest CT with IV contrast could be obtained in 3 months.       From an oncology perspective, the patient is doing well.  Reports compliance with Gleevec today and notes that he takes it every single day.  He denies any missed doses.  Unfortunately, the patient had a 2 week history of diarrhea.  This led him to report to [facility].  He was diagnosed with colitis and treated with Cipro and Flagyl antibiotics.  He notes resolution of his diarrhea.  He reports normalization of his bowel movements at this time.  He looks good today.  He has put on a few pounds since I have personally seen him.  He denies any acute abdominal pain today.  He notes a 50% appetite.  His energy level is a 50%.  He reports an 8 out of 10 abdominal pain, but this is not appreciated on exam or during discussion today.  He is here today to review CT imaging from January.  He missed his follow-up appointment following imaging.  Review of Systems  Constitutional: Positive for chills, fever and malaise/fatigue. Negative for weight loss.  HENT: Negative.   Eyes: Negative.   Respiratory: Negative.  Negative for cough.   Cardiovascular: Negative.  Negative  for chest pain.  Gastrointestinal: Positive for diarrhea, nausea and vomiting. Negative for blood in stool, constipation and melena.  Genitourinary: Negative.   Musculoskeletal: Negative.   Skin: Positive for itching and rash.  Neurological: Positive for dizziness. Negative for weakness.    Endo/Heme/Allergies: Bruises/bleeds easily.  Psychiatric/Behavioral: Negative.      Past Medical History:  Diagnosis Date  . Alcohol abuse   . Anemia   . Anxiety 09/04/2012  . GERD (gastroesophageal reflux disease)   . GIST (gastrointestinal stroma tumor), malignant, colon ([place])   . Hx of bipolar disorder   . Malignant GIST ([place]) 09/23/2014   initially diagnosed in 2014  . Pancreatitis   . PE (physical exam), annual   . Pulmonary nodule 05/29/2016  . Tobacco abuse   . 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     has No Known Allergies.  Current Outpatient Prescriptions on File Prior to Visit  Medication Sig Dispense Refill  . Aspirin-Caffeine 845-65 MG PACK Take 1 packet by mouth as needed.    . busPIRone (BUSPAR) 5 MG tablet Take 1 tablet (5 mg total) by mouth 3 (three) times daily. 90 tablet 1  . imatinib (GLEEVEC) 400 MG tablet Take 1 tablet (400 mg total) by mouth daily. 30 tablet 2  . lidocaine-prilocaine (EMLA) cream Apply 1 application topically as needed. 30 g 0  . mirtazapine (REMERON) 45 MG tablet Take 1 tablet (45 mg total) by mouth at bedtime as needed and may repeat dose one time if needed. 30 tablet 1   No current facility-administered medications on file prior to visit.     Past Surgical History:  Procedure Laterality Date  . HERNIA REPAIR    . PARTIAL COLECTOMY    . PORTACATH PLACEMENT Right 03/14/2015   Procedure: INSERTION PORT-A-CATH;  Surgeon: Aviva Signs, MD;  Location: [facility];  Service: General;  Laterality: Right;  . SMALL INTESTINE SURGERY     small bowel resection for obstruction    PHYSICAL EXAMINATION  ECOG PERFORMANCE STATUS: 1 - Symptomatic  but completely ambulatory  Vitals:   05/29/16 1529  BP: 114/71  Pulse: 77  Resp: 18  Temp: 98.8 F (37.1 C)    GENERAL:alert, no distress, comfortable, cooperative, smiling, unaccompanied, and mentally handicapped in some capacity, anxious/nervous. SKIN: skin color, texture, turgor are normal, no rashes or significant lesions HEAD: Normocephalic, No masses, lesions, tenderness or abnormalities EYES: normal, PERRLA, EOMI, Conjunctiva are pink and non-injected EARS: External ears normal OROPHARYNX:lips, buccal mucosa, and tongue normal and mucous membranes are moist  NECK: supple, trachea midline LYMPH:  no palpable lymphadenopathy BREAST:not examined LUNGS: clear to auscultation without wheezes, rales, or rhonchi. HEART: regular rate & rhythm, no murmurs and no gallops.  Normal S1 or S2. ABDOMEN: positive bowel sounds in all 4 quadrants without any appreciable tenderness. BACK: Back symmetric, no curvature. EXTREMITIES:less then 2 second capillary refill, no joint deformities, effusion, or inflammation, no skin discoloration  NEURO: alert & oriented x 3 with fluent speech, no focal motor/sensory deficits, gait normal   LABORATORY DATA: CBC    Component Value Date/Time   WBC 7.1 12/30/2015 0951   RBC 3.50 (L) 12/30/2015 0951   HGB 12.6 (L) 12/30/2015 0951   HCT 37.2 (L) 12/30/2015 0951   PLT  186 12/30/2015 0951   MCV 106.3 (H) 12/30/2015 0951   MCH 36.0 (H) 12/30/2015 0951   MCHC 33.9 12/30/2015 0951   RDW 14.8 12/30/2015 0951   LYMPHSABS 1.6 12/30/2015 0951   MONOABS 0.7 12/30/2015 0951   EOSABS 0.2 12/30/2015 0951   BASOSABS 0.1 12/30/2015 0951      Chemistry      Component Value Date/Time   NA 137 12/30/2015 0951   K 2.8 (L) 12/30/2015 0951   CL 102 12/30/2015 0951   CO2 27 12/30/2015 0951   BUN 10 12/30/2015 0951   CREATININE 1.50 (H) 03/14/2016 1303      Component Value Date/Time   CALCIUM 8.2 (L) 12/30/2015 0951   ALKPHOS 71 12/30/2015 0951   AST 63 (H)  12/30/2015 0951   ALT 49 12/30/2015 0951   BILITOT 0.5 12/30/2015 0951      Drugs of Abuse     Component Value Date/Time   LABOPIA NONE DETECTED 03/14/2015 0857   COCAINSCRNUR NONE DETECTED 03/14/2015 0857   LABBENZ NONE DETECTED 03/14/2015 0857   AMPHETMU POSITIVE (A) 03/14/2015 0857   THCU NONE DETECTED 03/14/2015 0857   LABBARB NONE DETECTED 03/14/2015 0857      PENDING LABS:   RADIOGRAPHIC STUDIES:  No results found.   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  He was started on adjuvant Gleevac on 04/13/2013.  He has been followed by [facility] and [facility] in the past for this, but complications associated with his pain medication led to him being seen by [place].  Complicated by history of EtOH and cocaine abuse with a UDS being POSITIVE for cocaine and negative for opoid on 12/02/2014 leading to the discontinuation of opoid prescribing by [place].  Case has been complicated by noncompliance and multiple missed follow-up appointments.  On Gleevac 400 mg daily beginning on 04/13/2013 with positive response to therapy.  Oncology history is updated.  Labs in 2 weeks with port flush at patient's request: CBC diff, CMET.  I personally reviewed and went over laboratory results with the patient.  The results are noted within this dictation.  He cancelled his March 2018 follow-up appointment with [place].  He reports compliance with his Gleevac, denying any missed doses.  He request a refill in his Gleevac and this will be printed for the patient and given to [employer].  I personally reviewed and went over radiographic studies with the patient.  The results are noted within this dictation.  CT CAP on 03/14/2016 demonstrated a new RUL opacity measuring 1 cm and nonspecific  subcarinal and right hilar lymphadenopathy.  Order is placed for CT chest with contrast.  If opacity and lymphadenopathy are persistent, then we will pursue PET imaging +/- referral to CTS/IR for diagnostic biopsy.  Gleevac compliance encouraged.  He also requests a refill on his [place] for his history of hypokalemia.  This will be escribed.  Problem list reviewed with patient and edited accordingly.  Medications are reviewed with the patient and edited accordingly.  Return in 3 weeks for follow-up.  More than 50% of the time spent with the patient was utilized for counseling and coordination of care.    Alcohol abuse, daily use EtOHism, persistent.  EtOH cessation education is provided.  Polysubstance abuse Polysubstance abuse, including cocaine.  No opiate prescribing  by [place].  GERD (gastroesophageal reflux disease) GERD, seen by GI.  Change in PPI therapy to [place] noted and EGD recommended.  Anxiety Anxiety  Pulmonary nodule RUL opacity, 1 cm in size with nonspecific subcarinal and right hilar lymphadenopathy.  Follow-up CT chest w contrast ordered.  COPD (chronic obstructive pulmonary disease) (HCC) COPD with emphysema, secondary to tobacco abuse.  THERAPY PLAN:  Continue daily Gleevac.  Work-up for abnormal CT chest is underway.  All questions were answered. The patient knows to call the clinic with any problems, questions or concerns. We can certainly see the patient much sooner if necessary.  Patient and plan discussed with Dr. Twana First and she is in agreement with the aforementioned.   This note is electronically signed by: Doy Mince 05/29/2016 4:21 PM

## 2016-05-29 NOTE — Assessment & Plan Note (Signed)
COPD with emphysema, secondary to tobacco abuse.

## 2016-05-29 NOTE — Assessment & Plan Note (Signed)
RUL opacity, 1 cm in size with nonspecific subcarinal and right hilar lymphadenopathy.  Follow-up CT chest w contrast ordered.

## 2016-05-29 NOTE — Assessment & Plan Note (Signed)
Anxiety

## 2016-05-29 NOTE — Assessment & Plan Note (Addendum)
Stage IV GIST, diagnosed in 2014 and S/P partial small bowel resection on 09/04/2012 but with recurrence of disease with extensive peritoneal disease.  He was started on adjuvant Gleevac on 04/13/2013.  He has been followed by [facility] and [facility] in the past for this, but complications associated with his pain medication led to him being seen by [place].  Complicated by history of EtOH and cocaine abuse with a UDS being POSITIVE for cocaine and negative for opoid on 12/02/2014 leading to the discontinuation of opoid prescribing by [place].  Case has been complicated by noncompliance and multiple missed follow-up appointments.  On Gleevac 400 mg daily beginning on 04/13/2013 with positive response to therapy.  Oncology history is updated.  Labs in 2 weeks with port flush at patient's request: CBC diff, CMET.  I personally reviewed and went over laboratory results with the patient.  The results are noted within this dictation.  He cancelled his March 2018 follow-up appointment with [place].  He reports compliance with his Gleevac, denying any missed doses.  He request a refill in his Gleevac and this will be printed for the patient and given to [employer].  I personally reviewed and went over radiographic studies with the patient.  The results are noted within this dictation.  CT CAP on 03/14/2016 demonstrated a new RUL opacity measuring 1 cm and nonspecific subcarinal and right hilar lymphadenopathy.  Order is placed for CT chest with contrast.  If opacity and lymphadenopathy are persistent, then we will pursue PET imaging +/- referral to CTS/IR for diagnostic biopsy.  Gleevac compliance encouraged.  He also requests a refill on his [place] for his history of hypokalemia.  This will be escribed.  Problem list reviewed with patient and edited accordingly.  Medications are reviewed with the patient and edited accordingly.  Return in 3 weeks for follow-up.  More than 50% of the time spent with the patient was  utilized for counseling and coordination of care.

## 2016-05-29 NOTE — Patient Instructions (Signed)
[place] at [facility] Discharge Instructions  RECOMMENDATIONS MADE BY THE CONSULTANT AND ANY TEST RESULTS WILL BE SENT TO YOUR REFERRING PHYSICIAN.  You were seen today by Kirby Crigler PA-C. Port flush and labs in 1-2 weeks. CT scan with contrast in 1-2 weeks. Return in 3-4 weeks for follow up and port flush.   Thank you for choosing [place] at [facility] to provide your oncology and hematology care.  To afford each patient quality time with our provider, please arrive at least 15 minutes before your scheduled appointment time.    If you have a lab appointment with the [facility] please come in thru the  Main Entrance and check in at the main information desk  You need to re-schedule your appointment should you arrive 10 or more minutes late.  We strive to give you quality time with our providers, and arriving late affects you and other patients whose appointments are after yours.  Also, if you no show three or more times for appointments you may be dismissed from the clinic at the providers discretion.     Again, thank you for choosing [facility].  Our hope is that these requests will decrease the amount of time that you wait before being seen by our physicians.       _____________________________________________________________  Should you have questions after your visit to [facility], please contact our office at [phone] between the hours of 8:30 a.m. and 4:30 p.m.  Voicemails left after 4:30 p.m. will not be returned until the following business day.  For prescription refill requests, have your pharmacy contact our office.       Resources For Cancer Patients and their Caregivers ? American Cancer Society: Can assist with transportation, wigs, general needs, runs Look Good Feel Better.        662-088-1564 ? Cancer Care: Provides financial assistance, online support groups, medication/co-pay  assistance.  1-800-813-HOPE (202) 179-1723) ? [place] Assists [place] cancer patients and their families through emotional , educational and financial support.  (651)782-0127 ? [place] DSS Where to apply for food stamps, Medicaid and utility assistance. 225 389 5493 ? RCATS: Transportation to medical appointments. 704-597-3511 ? Social Security Administration: May apply for disability if have a Stage IV cancer. ([phone] 536-8863 619-718-2242 ? [employer], Disability and Transit Services: Assists with nutrition, care and transit needs. [place] Support Programs: @10RELATIVEDAYS @ > Cancer Support Group  2nd Tuesday of the month 1pm-2pm, Journey Room  > Creative Journey  3rd Tuesday of the month 1130am-1pm, Journey Room  > Look Good Feel Better  1st Wednesday of the month 10am-12 noon, Journey Room (Call [place] to register [phone])

## 2016-06-13 ENCOUNTER — Other Ambulatory Visit (HOSPITAL_COMMUNITY): Payer: Medicaid Other

## 2016-06-13 ENCOUNTER — Ambulatory Visit (HOSPITAL_COMMUNITY): Payer: Medicaid Other

## 2016-06-14 ENCOUNTER — Ambulatory Visit (HOSPITAL_COMMUNITY): Payer: Medicaid Other | Admitting: Oncology

## 2016-06-22 ENCOUNTER — Other Ambulatory Visit (HOSPITAL_COMMUNITY): Payer: Medicaid Other

## 2016-07-03 ENCOUNTER — Ambulatory Visit (HOSPITAL_COMMUNITY)
Admission: RE | Admit: 2016-07-03 | Discharge: 2016-07-03 | Disposition: A | Discharge status: Home / Self Care | Payer: Medicaid Other | Source: Ambulatory Visit | Attending: Oncology | Admitting: Oncology

## 2016-07-03 DIAGNOSIS — C49A3 Gastrointestinal stromal tumor of small intestine: Secondary | ICD-10-CM | POA: Diagnosis not present

## 2016-07-03 MED ORDER — IOPAMIDOL (ISOVUE-300) INJECTION 61%
80.0000 mL | Freq: Once | INTRAVENOUS | Status: AC | PRN
  Administered 2016-07-03: 75 mL via INTRAVENOUS

## 2016-07-04 ENCOUNTER — Encounter (HOSPITAL_COMMUNITY): Payer: Self-pay | Admitting: Oncology

## 2016-07-04 ENCOUNTER — Encounter (HOSPITAL_COMMUNITY): Payer: Medicaid Other

## 2016-07-04 ENCOUNTER — Other Ambulatory Visit (HOSPITAL_COMMUNITY): Payer: Self-pay | Admitting: *Deleted

## 2016-07-04 ENCOUNTER — Encounter (HOSPITAL_COMMUNITY): Payer: Medicaid Other | Attending: Oncology | Admitting: Oncology

## 2016-07-04 VITALS — BP 132/76 | HR 75 | Temp 97.7°F | Resp 20 | Wt 193.2 lb

## 2016-07-04 DIAGNOSIS — F1010 Alcohol abuse, uncomplicated: Secondary | ICD-10-CM

## 2016-07-04 DIAGNOSIS — C49A3 Gastrointestinal stromal tumor of small intestine: Secondary | ICD-10-CM

## 2016-07-04 DIAGNOSIS — E876 Hypokalemia: Secondary | ICD-10-CM | POA: Insufficient documentation

## 2016-07-04 DIAGNOSIS — R911 Solitary pulmonary nodule: Secondary | ICD-10-CM

## 2016-07-04 DIAGNOSIS — J449 Chronic obstructive pulmonary disease, unspecified: Secondary | ICD-10-CM | POA: Diagnosis not present

## 2016-07-04 DIAGNOSIS — R197 Diarrhea, unspecified: Secondary | ICD-10-CM | POA: Insufficient documentation

## 2016-07-04 LAB — CBC WITH DIFFERENTIAL/PLATELET
Basophils Absolute: 0 10*3/uL (ref 0.0–0.1)
Basophils Relative: 1 %
EOS ABS: 0.2 10*3/uL (ref 0.0–0.7)
EOS PCT: 4 %
HCT: 37.7 % — ABNORMAL LOW (ref 39.0–52.0)
Hemoglobin: 12.9 g/dL — ABNORMAL LOW (ref 13.0–17.0)
LYMPHS ABS: 1.6 10*3/uL (ref 0.7–4.0)
Lymphocytes Relative: 28 %
MCH: 36.8 pg — AB (ref 26.0–34.0)
MCHC: 34.2 g/dL (ref 30.0–36.0)
MCV: 107.4 fL — ABNORMAL HIGH (ref 78.0–100.0)
Monocytes Absolute: 0.6 10*3/uL (ref 0.1–1.0)
Monocytes Relative: 10 %
Neutro Abs: 3.3 10*3/uL (ref 1.7–7.7)
Neutrophils Relative %: 57 %
PLATELETS: 162 10*3/uL (ref 150–400)
RBC: 3.51 MIL/uL — AB (ref 4.22–5.81)
RDW: 14.5 % (ref 11.5–15.5)
WBC: 5.8 10*3/uL (ref 4.0–10.5)

## 2016-07-04 LAB — COMPREHENSIVE METABOLIC PANEL
ALBUMIN: 3.5 g/dL (ref 3.5–5.0)
ALT: 11 U/L — AB (ref 17–63)
AST: 24 U/L (ref 15–41)
Alkaline Phosphatase: 69 U/L (ref 38–126)
Anion gap: 9 (ref 5–15)
BILIRUBIN TOTAL: 0.4 mg/dL (ref 0.3–1.2)
BUN: 6 mg/dL (ref 6–20)
CALCIUM: 7.7 mg/dL — AB (ref 8.9–10.3)
CO2: 26 mmol/L (ref 22–32)
CREATININE: 0.87 mg/dL (ref 0.61–1.24)
Chloride: 107 mmol/L (ref 101–111)
GFR calc Af Amer: >60 mL/min (ref >60)
GFR calc non Af Amer: >60 mL/min (ref >60)
GLUCOSE: 102 mg/dL — AB (ref 65–99)
Potassium: 2.9 mmol/L — ABNORMAL LOW (ref 3.5–5.1)
SODIUM: 142 mmol/L (ref 135–145)
TOTAL PROTEIN: 6.7 g/dL (ref 6.5–8.1)

## 2016-07-04 MED ORDER — DIPHENOXYLATE-ATROPINE 2.5-0.025 MG PO TABS: 1.0000 | ORAL_TABLET | Freq: Four times a day (QID) | ORAL | 2 refills | Status: DC | PRN

## 2016-07-04 MED ORDER — POTASSIUM CHLORIDE CRYS ER 20 MEQ PO TBCR: 60.0000 meq | EXTENDED_RELEASE_TABLET | Freq: Every day | ORAL | 2 refills | Status: DC

## 2016-07-04 NOTE — Assessment & Plan Note (Addendum)
EtOHism, persistent.  Cessation encouraged.

## 2016-07-04 NOTE — Assessment & Plan Note (Signed)
Improved

## 2016-07-04 NOTE — Assessment & Plan Note (Addendum)
Stage IV GIST, diagnosed in 2014 and S/P partial small bowel resection on 09/04/2012 but with recurrence of disease with extensive peritoneal disease.  He was started on adjuvant Gleevac on 04/13/2013.  He has been followed by [facility] and [facility] in the past for this, but complications associated with his pain medication led to him being seen by [place].  Complicated by history of EtOH and cocaine abuse with a UDS being POSITIVE for cocaine and negative for opoid on 12/02/2014 leading to the discontinuation of opoid prescribing by [place].  Case has been complicated by noncompliance and multiple missed follow-up appointments.  On Gleevac 400 mg daily beginning on 04/13/2013 with positive response to therapy.  Oncology history is updated.  Labs today: CBC diff, CMET.  I personally reviewed and went over laboratory results with the patient.  The results are noted within this dictation.  Hypokalemia is noted, secondary to diarrhea.    I will increase his Kdur to 60 mEq daily.  I will send a copy of labs to his primary care provider for further management.  Hypokalemia is likely secondary to ongoing diarrhea.  I will give an Rx for Lomotil.  Diarrhea sheet is provided.  Labs in 3 months: CBC diff, CMET.   I personally reviewed and went over radiographic studies with the patient.  The results are noted within this dictation.  I personally reviewed the images in PACS.  CT imaging of chest demonstrates a decrease in size of thoracic lymph node.  No other suspicious findings concerning for recurrence/relapse of disease.  Ongoing compliance with Gleevac is encouraged.  He is taking 3 separate PPIs per medication list: Nexium, Prilosec, Protonix.  I have advised the patient to follow-up with PCP to discuss which PPI he should continue and which two to stop.  Return in 3 months for follow-up.

## 2016-07-04 NOTE — Patient Instructions (Signed)
[place] at [facility] Discharge Instructions  RECOMMENDATIONS MADE BY THE CONSULTANT AND ANY TEST RESULTS WILL BE SENT TO YOUR REFERRING PHYSICIAN.  You were seen today by Kirby Crigler PA-C. Diarrhea sheet given. Rx for Lomotil and potassium sent to [facility] drug. Follow up with Dr. Thera Flake for potassium levels. Return in 3 months for labs and follow up.   Thank you for choosing [place] at [facility] to provide your oncology and hematology care.  To afford each patient quality time with our provider, please arrive at least 15 minutes before your scheduled appointment time.    If you have a lab appointment with the [facility] please come in thru the  Main Entrance and check in at the main information desk  You need to re-schedule your appointment should you arrive 10 or more minutes late.  We strive to give you quality time with our providers, and arriving late affects you and other patients whose appointments are after yours.  Also, if you no show three or more times for appointments you may be dismissed from the clinic at the providers discretion.     Again, thank you for choosing [facility].  Our hope is that these requests will decrease the amount of time that you wait before being seen by our physicians.       _____________________________________________________________  Should you have questions after your visit to [facility], please contact our office at [phone] between the hours of 8:30 a.m. and 4:30 p.m.  Voicemails left after 4:30 p.m. will not be returned until the following business day.  For prescription refill requests, have your pharmacy contact our office.       Resources For Cancer Patients and their Caregivers ? American Cancer Society: Can assist with transportation, wigs, general needs, runs Look Good Feel Better.        682-321-1637 ? Cancer Care: Provides financial  assistance, online support groups, medication/co-pay assistance.  1-800-813-HOPE 334-616-0243) ? [place] Assists [place] cancer patients and their families through emotional , educational and financial support.  9378805053 ? [place] DSS Where to apply for food stamps, Medicaid and utility assistance. 434-031-5085 ? RCATS: Transportation to medical appointments. 843 846 1726 ? Social Security Administration: May apply for disability if have a Stage IV cancer. 248-294-4039 7371964047 ? [employer], Disability and Transit Services: Assists with nutrition, care and transit needs. [place] Support Programs: @10RELATIVEDAYS @ > Cancer Support Group  2nd Tuesday of the month 1pm-2pm, Journey Room  > Creative Journey  3rd Tuesday of the month 1130am-1pm, Journey Room  > Look Good Feel Better  1st Wednesday of the month 10am-12 noon, Journey Room (Call [place] to register [phone])

## 2016-07-04 NOTE — Assessment & Plan Note (Addendum)
COPD with emphysema, smoking cessation provided.

## 2016-07-04 NOTE — Progress Notes (Signed)
[name], MD 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 male returns for followup of Stage IV GIST, diagnosed in 2014 status post partial small bowel resection on 09/05/2015 with recurrence of disease with extensive peritoneal disease.  He was started on adjuvant Gleevec on 04/13/2013.  He has been followed by [facility] in the past and [facility], but with complications associated with pain medication leading to discharge from both facilities.  Therefore he has been followed by [place].  His course has been complicated by history of EtOH and cocaine abuse with a urine drug screen being POSITIVE for cocaine and negative for opiate on 12/02/2014 leading to discontinuation of opiate prescribing by [place].  Additionally, case has been complicated by multiple missed appointments and suspicion of noncompliance with Gleevec therapy.    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      02/10/2015 Imaging    CT CAP-  Mildly reduced size of left abdominal tumor implants. No new tumor implants identified.      03/14/2015 Procedure    Port-a-cath placed by Dr. Arnoldo Morale      03/14/2016 Imaging    CT CAP- 1. Continued reduction in size of left abdominal peritoneal metastases. No new or progressive metastatic disease in the abdomen or pelvis. 2. Solitary new irregular 1.0 cm  nodular opacity in the anterior right upper lobe, highly nonspecific, potentially inflammatory, however cannot exclude pulmonary malignancy (metastatic or primary lung carcinoma). New nonspecific mild subcarinal and right hilar lymphadenopathy, cannot exclude new nodal metastases. PET-CT could be useful for further characterization / biopsy planning, as clinically warranted. Alternatively, a follow-up chest CT with IV contrast could be obtained in 3 months.      07/03/2016 Imaging    Ct chest- No findings specific for metastatic disease in the chest.  Small thoracic lymph nodes measuring up to 9 mm short axis, decreased.  No suspicious pulmonary nodules.       HPI Elements   Location: [place]  Quality: GIST  Severity: Stage IV  Duration: Dx in 2014  Context: Gleevac beginning on 04/13/2013  Timing:   Modifying Factors: Noncompliance  Associated Signs & Symptoms:    He reports going to [facility] on 2 separate occasions with diarrhea.  He was treated for possible infection with vancomycin and Flagyl.  He notes ongoing diarrhea.  He reports going 5-6 times per day.  He is using over-the-counter Imodium with minimal benefit.  He notes he is out of Imodium.  He shows me his current medication list which includes 3 PPIs-Nexium, omeprazole, and Protonix.  Potassium to follow-up with his primary care physician regarding which one he should be taking.  He reports compliance with his Gleevec and potassium.  Review of Systems  Constitutional: Negative.  Negative for chills, fever and weight loss.  HENT: Negative.   Eyes: Negative.   Respiratory: Negative.  Negative for cough.   Cardiovascular: Negative.  Negative  for chest pain.  Gastrointestinal: Positive for diarrhea. Negative for blood in stool, constipation, melena, nausea and vomiting.  Genitourinary: Negative.   Musculoskeletal: Negative.   Skin: Positive for itching and rash.  Neurological: Positive for  dizziness. Negative for weakness.  Endo/Heme/Allergies: Negative.   Psychiatric/Behavioral: Negative.     Past Medical History:  Diagnosis Date  . Alcohol abuse   . Anemia   . Anxiety 09/04/2012  . GERD (gastroesophageal reflux disease)   . GIST (gastrointestinal stroma tumor), malignant, colon ([place])   . Hx of bipolar disorder   . Malignant GIST ([place]) 09/23/2014   initially diagnosed in 2014  . Pancreatitis   . PE (physical exam), annual   . Pulmonary nodule 05/29/2016  . Tobacco abuse   . Ventral hernia     Past Surgical History:  Procedure Laterality Date  . HERNIA REPAIR    . PARTIAL COLECTOMY    . PORTACATH PLACEMENT Right 03/14/2015   Procedure: INSERTION PORT-A-CATH;  Surgeon: Aviva Signs, MD;  Location: [facility];  Service: General;  Laterality: Right;  . SMALL INTESTINE SURGERY     small bowel resection for obstruction    Family History  Problem Relation Age of Onset  . Cancer Mother   . Hypertension Father   . Cancer Brother     Social History   Social History  . Marital status: Single    Spouse name: [name]  . Number of children: [name]  . Years of education: [name]   Social History Main Topics  . Smoking status: Current Every Day Smoker    Packs/day: 1.00    Years: 36.00    Types: Cigarettes  . Smokeless tobacco: Never Used  . Alcohol use 14.4 oz/week    24 Cans of beer per week     Comment: 2 - 3 cans beer daily  . Drug use: Yes    Types: Cocaine     Comment: yesterday  . Sexual activity: Not Currently   Other Topics Concern  . None   Social History Narrative  . None     PHYSICAL EXAMINATION  ECOG PERFORMANCE STATUS: 1 - Symptomatic but completely ambulatory  Vitals:   07/04/16 1107  BP: 132/76  Pulse: 75  Resp: 20  Temp: 97.7 F (36.5 C)    GENERAL:alert, no distress, well nourished, well developed, comfortable, cooperative, smiling and unaccompanied SKIN: skin color, texture, turgor are normal, no rashes or significant lesions HEAD:  Normocephalic, No masses, lesions, tenderness or abnormalities EYES: normal, EOMI, Conjunctiva are pink and non-injected EARS: External ears normal OROPHARYNX:lips, buccal mucosa, and tongue normal and mucous membranes are moist  NECK: supple, trachea midline LYMPH:  no palpable lymphadenopathy BREAST:not examined LUNGS: clear to auscultation  HEART: regular rate & rhythm ABDOMEN:abdomen soft and normal bowel sounds BACK: Back symmetric, no curvature. EXTREMITIES:less then 2 second capillary refill, no joint deformities, effusion, or inflammation, no skin discoloration, no cyanosis  NEURO: alert & oriented x 3 with fluent speech, no focal motor/sensory deficits, gait normal   LABORATORY DATA: CBC    Component Value Date/Time   WBC 5.8 07/04/2016 0950   RBC 3.51 (L) 07/04/2016 0950   HGB 12.9 (L) 07/04/2016 0950   HCT 37.7 (L) 07/04/2016 0950   PLT 162 07/04/2016 0950   MCV 107.4 (H) 07/04/2016 0950   MCH 36.8 (H) 07/04/2016 0950   MCHC 34.2 07/04/2016 0950   RDW 14.5 07/04/2016 0950   LYMPHSABS 1.6 07/04/2016 0950   MONOABS 0.6 07/04/2016 0950   EOSABS  0.2 07/04/2016 0950   BASOSABS 0.0 07/04/2016 0950      Chemistry      Component Value Date/Time   NA 142 07/04/2016 0950   K 2.9 (L) 07/04/2016 0950   CL 107 07/04/2016 0950   CO2 26 07/04/2016 0950   BUN 6 07/04/2016 0950   CREATININE 0.87 07/04/2016 0950      Component Value Date/Time   CALCIUM 7.7 (L) 07/04/2016 0950   ALKPHOS 69 07/04/2016 0950   AST 24 07/04/2016 0950   ALT 11 (L) 07/04/2016 0950   BILITOT 0.4 07/04/2016 0950        PENDING LABS:   RADIOGRAPHIC STUDIES:  Ct Chest W Contrast  Result Date: 07/03/2016 CLINICAL DATA:  Malignant GIST, with peritoneal metastases, and right upper lobe opacity EXAM: CT CHEST WITH CONTRAST TECHNIQUE: Multidetector CT imaging of the chest was performed during intravenous contrast administration. CONTRAST:  38mL ISOVUE-300 IOPAMIDOL (ISOVUE-300) INJECTION 61%  COMPARISON:  03/14/2016 FINDINGS: Cardiovascular: The heart is normal in size. No pericardial effusion. Three vessel coronary atherosclerosis. No evidence of thoracic aortic aneurysm. Atherosclerotic calcifications aortic arch. Mediastinum/Nodes: Small thoracic lymph nodes, including a 9 mm short axis right hilar node (series 2/ image 72) and a 6 mm short axis subcarinal node (series 2/image 75), decreased. These are now within the upper limits of normal. Visualized thyroid is unremarkable. Lungs/Pleura: Moderate centrilobular and paraseptal emphysematous changes, upper lobe predominant. Subpleural scarring in the anterior right upper lobe (series 5/ image 63). Additional nodular scarring in the lateral right lower lobe (series 5/image 119). Additional subpleural nodularity/ scarring in the lateral left lower lobe (series 5/ image 137). No suspicious pulmonary nodules. Mild dependent atelectasis in the right lower lobe. No focal consolidation. No pleural effusion or pneumothorax. Upper Abdomen: Visualized upper abdomen is unremarkable. Musculoskeletal: Visualized osseous structures are within normal limits. IMPRESSION: No findings specific for metastatic disease in the chest. Small thoracic lymph nodes measuring up to 9 mm short axis, decreased. No suspicious pulmonary nodules. Electronically Signed   By: Julian Hy M.D.   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  He was started on adjuvant Gleevac on 04/13/2013.  He has been followed by [facility] and [facility] in the past for this, but complications associated with his pain medication led to him being seen by [place].  Complicated by history of EtOH and cocaine abuse with a UDS being POSITIVE for cocaine and  negative for opoid on 12/02/2014 leading to the discontinuation of opoid prescribing by [place].  Case has been complicated by noncompliance and multiple missed follow-up appointments.  On Gleevac 400 mg daily beginning on 04/13/2013 with positive response to therapy.  Oncology history is updated.  Labs today: CBC diff, CMET.  I personally reviewed and went over laboratory results with the patient.  The results are noted within this dictation.  Hypokalemia is noted, secondary to diarrhea.    I will increase his Kdur to 60 mEq daily.  I will send a copy of labs to his primary care provider for further management.  Hypokalemia is likely secondary to ongoing diarrhea.  I will give an Rx for Lomotil.  Diarrhea sheet is provided.  Labs in 3 months: CBC diff, CMET.   I personally reviewed and went over radiographic  studies with the patient.  The results are noted within this dictation.  I personally reviewed the images in PACS.  CT imaging of chest demonstrates a decrease in size of thoracic lymph node.  No other suspicious findings concerning for recurrence/relapse of disease.  Ongoing compliance with Gleevac is encouraged.  He is taking 3 separate PPIs per medication list: Nexium, Prilosec, Protonix.  I have advised the patient to follow-up with PCP to discuss which PPI he should continue and which two to stop.  Return in 3 months for follow-up.  Pulmonary nodule Improved.  Alcohol abuse, daily use EtOHism, persistent.  Cessation encouraged.  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   ORDERS PLACED FOR THIS ENCOUNTER: No orders of the defined types were placed in this encounter.   MEDICATIONS PRESCRIBED THIS ENCOUNTER: Meds ordered this encounter  Medications  . diphenoxylate-atropine (LOMOTIL) 2.5-0.025 MG tablet    Sig: Take 1 tablet by mouth 4 (four) times daily as needed for diarrhea or loose stools.    Dispense:  60 tablet      Refill:  2    Order Specific Question:   Supervising Provider    Answer:   Brunetta Genera [[id number]]  . potassium chloride SA (K-DUR,KLOR-CON) 20 MEQ tablet    Sig: Take 3 tablets (60 mEq total) by mouth daily.    Dispense:  90 tablet    Refill:  2    Order Specific Question:   Supervising Provider    Answer:   Brunetta Genera [[id number]]  . omeprazole (PRILOSEC) 20 MG capsule    Sig: TAKE ONE CAPSULE BY MOUTH ONCE DAILY BEFORE a meal.    Refill:  0    THERAPY PLAN:  Continue Gleevac daily.  All questions were answered. The patient knows to call the clinic with any problems, questions or concerns. We can certainly see the patient much sooner if necessary.  Patient and plan discussed with Dr. Twana First and she is in agreement with the aforementioned.   This note is electronically signed by: Doy Mince 07/04/2016 2:13 PM

## 2016-08-13 ENCOUNTER — Encounter (HOSPITAL_COMMUNITY): Payer: Medicaid Other

## 2016-08-17 ENCOUNTER — Encounter (HOSPITAL_COMMUNITY): Payer: Medicaid Other

## 2016-08-20 ENCOUNTER — Other Ambulatory Visit (HOSPITAL_COMMUNITY): Payer: Self-pay | Admitting: Oncology

## 2016-08-27 IMAGING — CT CT ABD-PELV W/ CM
2 of 5 series · 15 of 46 positions shown, 17 images · IV contrast (iopamidol)
Comparison: 02/10/2015

CLINICAL DATA: Followup stage IV GIST tumor.

EXAM:
CT ABDOMEN AND PELVIS WITH CONTRAST
TECHNIQUE: Multidetector CT imaging of the abdomen and pelvis was performed
using the standard protocol following bolus administration of
intravenous contrast.
CONTRAST:  100mL [id number] IOPAMIDOL (OK9DSF-V88) INJECTION 61%

[Series 2: routine abd pel with · axial · 0.71mm/px · z∈[-542,-107]mm · 12 of 103 slices shown, 14 images]
[im 8/103  soft-tissue]
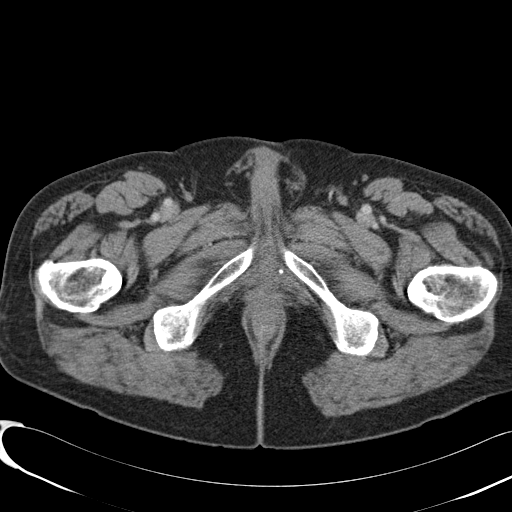
[im 8/103  bone]
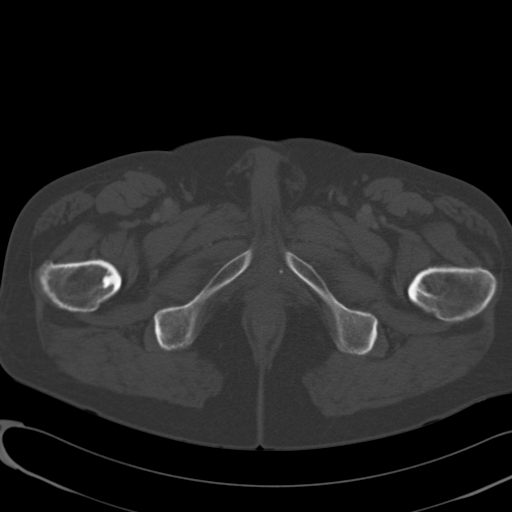
[im 16/103  soft-tissue]
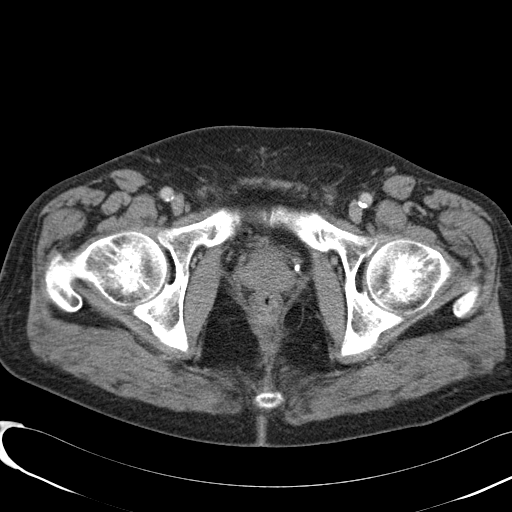
[im 24/103  soft-tissue]
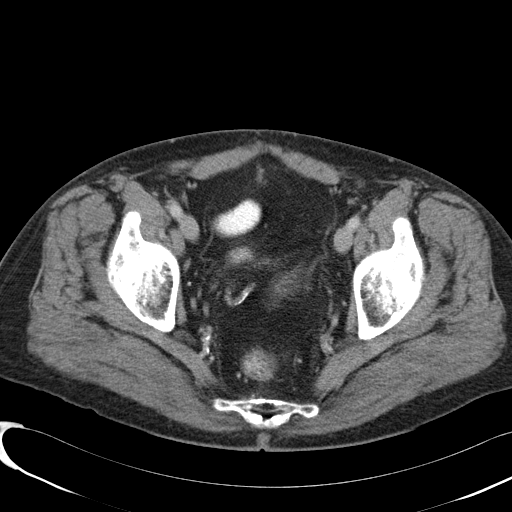
[im 32/103  soft-tissue]
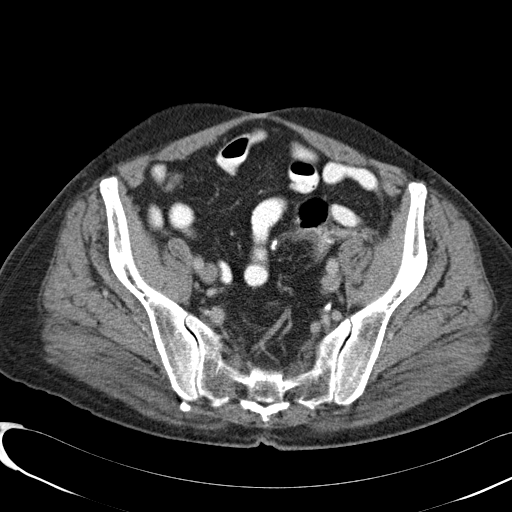
[im 40/103  soft-tissue]
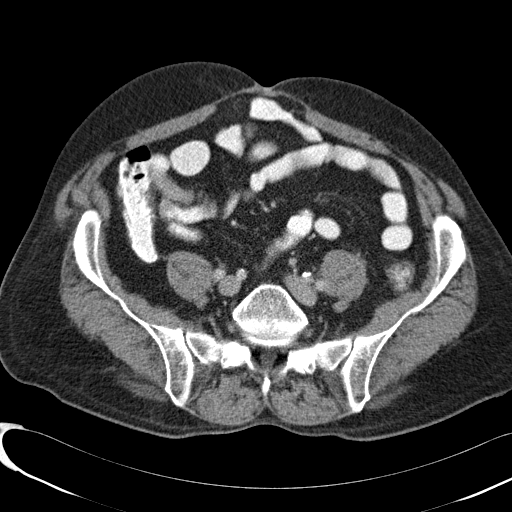
[im 48/103  soft-tissue]
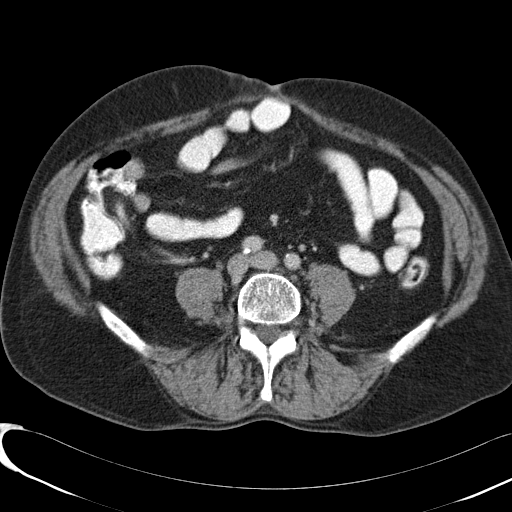
[im 55/103  soft-tissue]
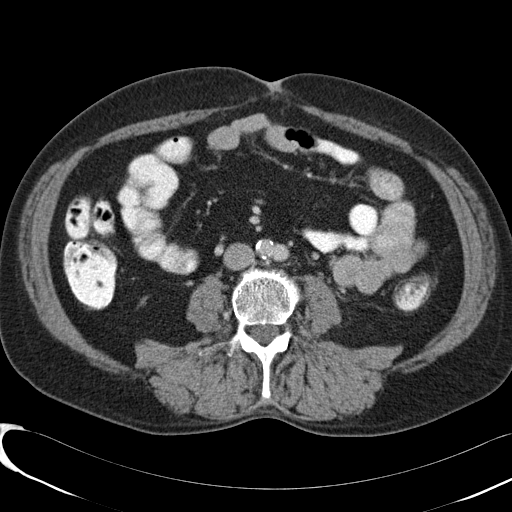
[im 63/103  soft-tissue]
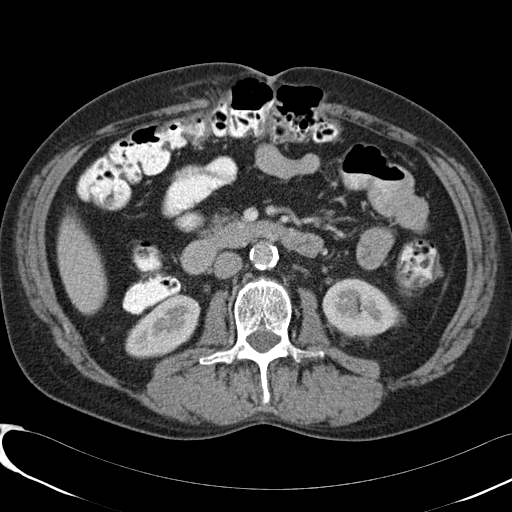
[im 71/103  soft-tissue]
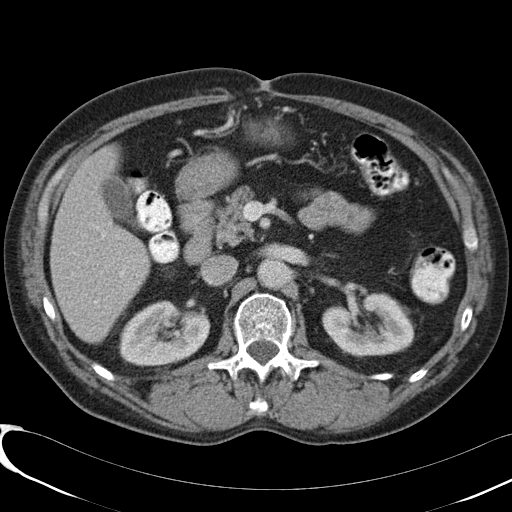
[im 71/103  bone]
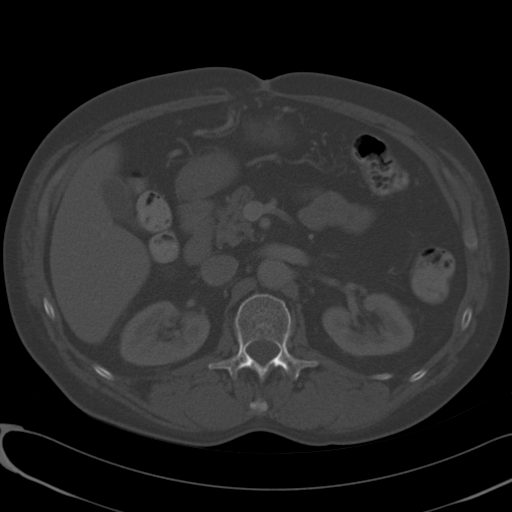
[im 79/103  soft-tissue]
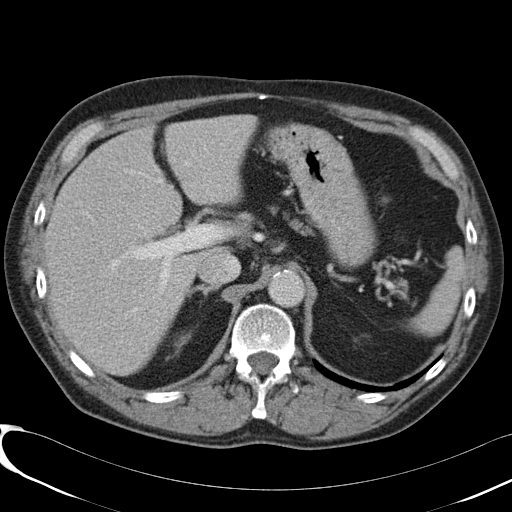
[im 87/103  soft-tissue]
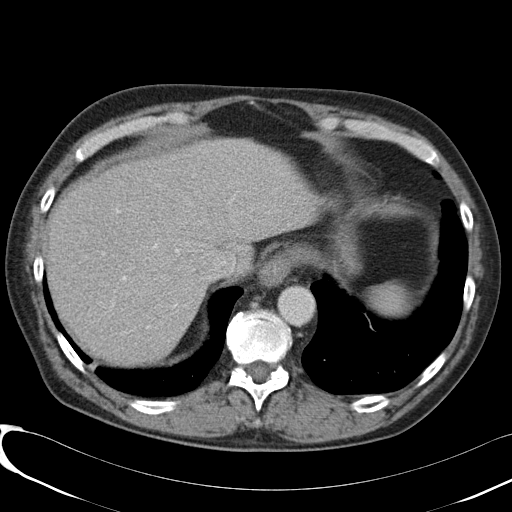
[im 95/103  soft-tissue]
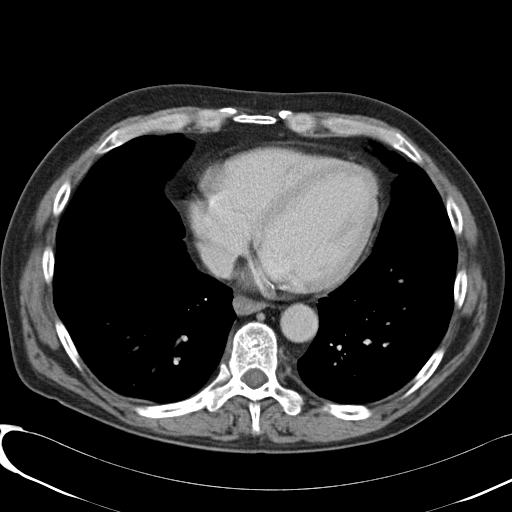

[Series 4: coronal · coronal · 0.74mm/px · 3 of 132 slices shown]
[im 44/132  soft-tissue]
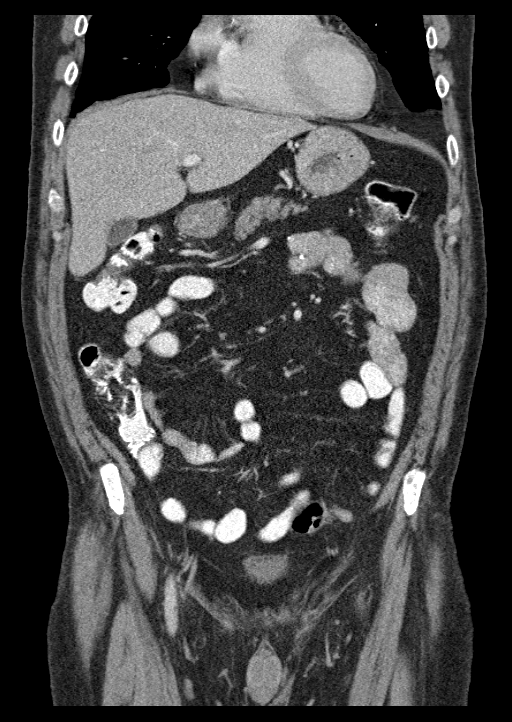
[im 59/132  soft-tissue]
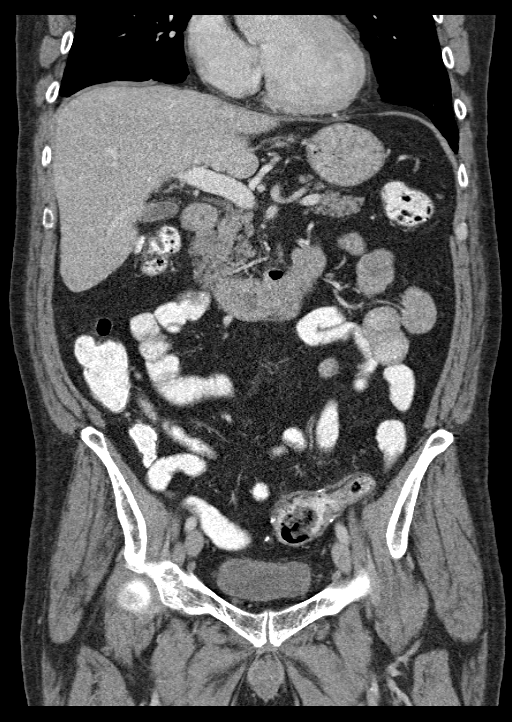
[im 73/132  soft-tissue]
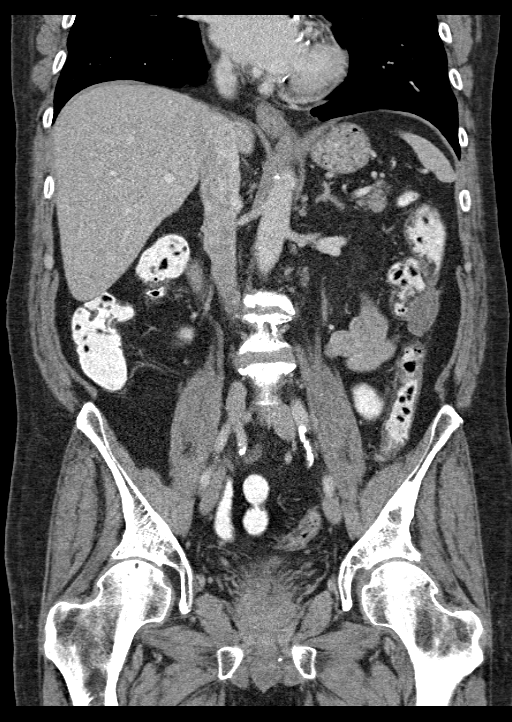

[15 of 46 positions shown; findings below may reference images not displayed]

FINDINGS: Lower chest: Moderate changes of emphysema identified. No pleural or
pericardial effusion.

Hepatobiliary: No masses or other significant abnormality.

Pancreas: No mass, inflammatory changes, or other significant
abnormality.

Spleen: Within normal limits in size and appearance.

Adrenals/Urinary Tract: Normal adrenal glands. Normal appearance of
the kidneys. The urinary bladder is unremarkable.

Stomach/Bowel: The stomach is normal. The small bowel loops have a
normal caliber. No pathologic dilatation of the large or small bowel
loops. There is a ventral abdominal wall hernia which contains a
nonobstructed loop of transverse colon, image 42 of series 2.
Colocolonic anastomosis within the left lower quadrant of the
abdomen is identified. No complications.

Vascular/Lymphatic: Calcified atherosclerotic disease involves the
abdominal aorta. No aneurysm. No enlarged retroperitoneal or
mesenteric adenopathy. No enlarged pelvic or inguinal lymph nodes.

Reproductive: Prostate gland and seminal vesicles are unremarkable.

Other: There is no ascites or focal fluid collections within the
abdomen or pelvis. Multifocal peritoneal nodularity identified. The
peritoneal implant along the descending colon measures 1.9 x 1.8 cm,
image 45 of series 2. Previously 2.7 x 2.3 cm. The peritoneal
implant along the undersurface of the ventral abdominal wall
measures 6 mm, image 41 of series 2. Previously 9 mm. Left lower
lobe soft tissue implant measures 6 mm, image 52 of series 7.
Previously 1 cm.

Musculoskeletal:  No suspicious bone lesions identified.
IMPRESSION: 1. In no acute findings. Peritoneal implants are again noted
compatible with trans peritoneal spread of tumor. The index lesions
are stable to slightly decreased in size from previous exam. No new
or progressive nodularity identified.

## 2016-08-29 ENCOUNTER — Encounter (HOSPITAL_COMMUNITY): Payer: Self-pay

## 2016-08-29 ENCOUNTER — Encounter (HOSPITAL_COMMUNITY): Payer: Medicaid Other | Attending: Oncology

## 2016-08-29 DIAGNOSIS — R197 Diarrhea, unspecified: Secondary | ICD-10-CM | POA: Insufficient documentation

## 2016-08-29 DIAGNOSIS — Z452 Encounter for adjustment and management of vascular access device: Secondary | ICD-10-CM

## 2016-08-29 DIAGNOSIS — C49A3 Gastrointestinal stromal tumor of small intestine: Secondary | ICD-10-CM

## 2016-08-29 DIAGNOSIS — E876 Hypokalemia: Secondary | ICD-10-CM | POA: Insufficient documentation

## 2016-08-29 MED ORDER — SODIUM CHLORIDE 0.9% FLUSH
10.0000 mL | INTRAVENOUS | Status: DC | PRN
  Administered 2016-08-29: 10 mL via INTRAVENOUS
  Filled 2016-08-29: qty 10

## 2016-08-29 MED ORDER — HEPARIN SOD (PORK) LOCK FLUSH 100 UNIT/ML IV SOLN
500.0000 [IU] | Freq: Once | INTRAVENOUS | Status: AC
  Administered 2016-08-29: 500 [IU] via INTRAVENOUS

## 2016-08-29 NOTE — Progress Notes (Signed)
[name] presented for Portacath access and flush. Portacath located rightchest wall accessed with  H 20 needle. Good blood return present. Portacath flushed with 34ml NS and 500U/2ml Heparin and needle removed intact. Procedure without incident. Patient tolerated procedure well. Vitals stable and discharged home from clinic ambulatory. Follow up as scheduled.

## 2016-08-31 ENCOUNTER — Other Ambulatory Visit (HOSPITAL_COMMUNITY): Payer: Self-pay | Admitting: Adult Health

## 2016-08-31 DIAGNOSIS — C49A9 Gastrointestinal stromal tumor of other sites: Secondary | ICD-10-CM

## 2016-08-31 MED ORDER — IMATINIB MESYLATE 400 MG PO TABS: 400.0000 mg | ORAL_TABLET | Freq: Every day | ORAL | 0 refills | Status: DC

## 2016-10-05 ENCOUNTER — Other Ambulatory Visit (HOSPITAL_COMMUNITY): Payer: Self-pay

## 2016-10-05 DIAGNOSIS — C49A9 Gastrointestinal stromal tumor of other sites: Secondary | ICD-10-CM

## 2016-10-05 MED ORDER — IMATINIB MESYLATE 400 MG PO TABS: 400.0000 mg | ORAL_TABLET | Freq: Every day | ORAL | 0 refills | Status: DC

## 2016-10-05 NOTE — Telephone Encounter (Signed)
Received refill request from patients speciality pharmacy for [place]. Reviewed with provider, chart checked and refilled.

## 2016-10-09 ENCOUNTER — Ambulatory Visit (HOSPITAL_COMMUNITY): Payer: Medicaid Other

## 2016-10-24 ENCOUNTER — Other Ambulatory Visit (HOSPITAL_COMMUNITY): Payer: Medicaid Other

## 2016-10-24 ENCOUNTER — Ambulatory Visit (HOSPITAL_COMMUNITY): Payer: Medicaid Other | Admitting: Adult Health

## 2016-10-27 ENCOUNTER — Other Ambulatory Visit (HOSPITAL_COMMUNITY): Payer: Self-pay | Admitting: Oncology

## 2016-10-27 DIAGNOSIS — R197 Diarrhea, unspecified: Secondary | ICD-10-CM

## 2016-11-02 ENCOUNTER — Encounter (HOSPITAL_COMMUNITY): Payer: Self-pay | Admitting: Oncology

## 2016-11-02 ENCOUNTER — Encounter (HOSPITAL_COMMUNITY): Payer: Medicaid Other | Attending: Oncology | Admitting: Oncology

## 2016-11-02 ENCOUNTER — Encounter (HOSPITAL_BASED_OUTPATIENT_CLINIC_OR_DEPARTMENT_OTHER): Payer: Medicaid Other

## 2016-11-02 VITALS — BP 159/88 | HR 70 | Temp 97.8°F | Resp 18 | Wt 202.2 lb

## 2016-11-02 DIAGNOSIS — C49A3 Gastrointestinal stromal tumor of small intestine: Secondary | ICD-10-CM | POA: Insufficient documentation

## 2016-11-02 DIAGNOSIS — E876 Hypokalemia: Secondary | ICD-10-CM | POA: Insufficient documentation

## 2016-11-02 DIAGNOSIS — F1110 Opioid abuse, uncomplicated: Secondary | ICD-10-CM

## 2016-11-02 DIAGNOSIS — F1010 Alcohol abuse, uncomplicated: Secondary | ICD-10-CM | POA: Diagnosis not present

## 2016-11-02 DIAGNOSIS — R197 Diarrhea, unspecified: Secondary | ICD-10-CM | POA: Insufficient documentation

## 2016-11-02 DIAGNOSIS — Z95828 Presence of other vascular implants and grafts: Secondary | ICD-10-CM

## 2016-11-02 LAB — CBC WITH DIFFERENTIAL/PLATELET
Basophils Absolute: 0 10*3/uL (ref 0.0–0.1)
Basophils Relative: 0 %
EOS ABS: 0.1 10*3/uL (ref 0.0–0.7)
EOS PCT: 1 %
HCT: 38.7 % — ABNORMAL LOW (ref 39.0–52.0)
Hemoglobin: 13.3 g/dL (ref 13.0–17.0)
LYMPHS ABS: 2.7 10*3/uL (ref 0.7–4.0)
LYMPHS PCT: 27 %
MCH: 36.1 pg — AB (ref 26.0–34.0)
MCHC: 34.4 g/dL (ref 30.0–36.0)
MCV: 105.2 fL — AB (ref 78.0–100.0)
MONO ABS: 0.6 10*3/uL (ref 0.1–1.0)
MONOS PCT: 6 %
Neutro Abs: 6.5 10*3/uL (ref 1.7–7.7)
Neutrophils Relative %: 66 %
PLATELETS: 217 10*3/uL (ref 150–400)
RBC: 3.68 MIL/uL — ABNORMAL LOW (ref 4.22–5.81)
RDW: 15 % (ref 11.5–15.5)
WBC: 9.9 10*3/uL (ref 4.0–10.5)

## 2016-11-02 LAB — COMPREHENSIVE METABOLIC PANEL
ALBUMIN: 3.7 g/dL (ref 3.5–5.0)
ALT: 16 U/L — AB (ref 17–63)
AST: 26 U/L (ref 15–41)
Alkaline Phosphatase: 82 U/L (ref 38–126)
Anion gap: 9 (ref 5–15)
BUN: 9 mg/dL (ref 6–20)
CHLORIDE: 105 mmol/L (ref 101–111)
CO2: 26 mmol/L (ref 22–32)
CREATININE: 1.07 mg/dL (ref 0.61–1.24)
Calcium: 7.9 mg/dL — ABNORMAL LOW (ref 8.9–10.3)
GFR calc Af Amer: >60 mL/min (ref >60)
GFR calc non Af Amer: >60 mL/min (ref >60)
GLUCOSE: 92 mg/dL (ref 65–99)
POTASSIUM: 3.3 mmol/L — AB (ref 3.5–5.1)
Sodium: 140 mmol/L (ref 135–145)
Total Bilirubin: 0.3 mg/dL (ref 0.3–1.2)
Total Protein: 7 g/dL (ref 6.5–8.1)

## 2016-11-02 LAB — RAPID URINE DRUG SCREEN, HOSP PERFORMED
AMPHETAMINES: NOT DETECTED
BARBITURATES: NOT DETECTED
BENZODIAZEPINES: NOT DETECTED
COCAINE: NOT DETECTED
OPIATES: NOT DETECTED
Tetrahydrocannabinol: NOT DETECTED

## 2016-11-02 MED ORDER — HEPARIN SOD (PORK) LOCK FLUSH 100 UNIT/ML IV SOLN
500.0000 [IU] | Freq: Once | INTRAVENOUS | Status: AC
  Administered 2016-11-02: 500 [IU] via INTRAVENOUS

## 2016-11-02 MED ORDER — OXYCODONE HCL 5 MG PO TABS: 5.0000 mg | ORAL_TABLET | Freq: Four times a day (QID) | ORAL | 0 refills | Status: DC | PRN

## 2016-11-02 MED ORDER — SODIUM CHLORIDE 0.9% FLUSH
10.0000 mL | INTRAVENOUS | Status: DC | PRN
  Administered 2016-11-02: 10 mL via INTRAVENOUS
  Filled 2016-11-02: qty 10

## 2016-11-02 NOTE — Progress Notes (Signed)
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 male returns for followup of Stage IV GIST, diagnosed in 2014 status post partial small bowel resection on 09/05/2015 with recurrence of disease with extensive peritoneal disease.  He was started on adjuvant Gleevec on 04/13/2013.  He has been followed by [facility] in the past and [facility], but with complications associated with pain medication leading to discharge from both facilities.  Therefore he has been followed by [place].  His course has been complicated by history of EtOH and cocaine abuse with a urine drug screen being POSITIVE for cocaine and negative for opiate on 12/02/2014 leading to discontinuation of opiate prescribing by [place].  Additionally, case has been complicated by multiple missed appointments and suspicion of noncompliance with Gleevec therapy.    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      02/10/2015 Imaging    CT CAP-  Mildly reduced size of left abdominal tumor implants. No new tumor implants identified.      03/14/2015 Procedure    Port-a-cath placed by Dr. Arnoldo Morale      03/14/2016 Imaging    CT CAP- 1. Continued reduction in size of left abdominal peritoneal metastases. No new or progressive metastatic disease in the abdomen or pelvis. 2. Solitary new irregular 1.0 cm nodular  opacity in the anterior right upper lobe, highly nonspecific, potentially inflammatory, however cannot exclude pulmonary malignancy (metastatic or primary lung carcinoma). New nonspecific mild subcarinal and right hilar lymphadenopathy, cannot exclude new nodal metastases. PET-CT could be useful for further characterization / biopsy planning, as clinically warranted. Alternatively, a follow-up chest CT with IV contrast could be obtained in 3 months.      07/03/2016 Imaging    Ct chest- No findings specific for metastatic disease in the chest.  Small thoracic lymph nodes measuring up to 9 mm short axis, decreased.  No suspicious pulmonary nodules.      Patient presents today for continued follow-up. He states that for the past 4 months he's been having severe pain in the epigastric region. He states that over-the-counter pain meds have not been alleviating his pain. He states he's been needing to go to [facility] ED for pain meds for relief. He states that he's remained drug-free and only drinks beer these days. He states he's been taking his Gleevec and tolerating it well. He denies any chest pain, shortness of breath, nausea, vomiting, diarrhea, focal weakness.  Review of Systems  Constitutional: Negative.  Negative for chills, fever and weight loss.  HENT: Negative.   Eyes: Negative.   Respiratory: Negative.  Negative for cough.   Cardiovascular: Negative.  Negative for chest pain.  Gastrointestinal: Positive for abdominal pain. Negative for blood in stool, constipation, diarrhea, melena, nausea and vomiting.  Genitourinary: Negative.   Musculoskeletal: Negative.   Skin: Negative for itching and rash.  Neurological: Negative for dizziness and weakness.  Endo/Heme/Allergies: Negative.  Psychiatric/Behavioral: Negative.     Past Medical History:  Diagnosis Date  . Alcohol abuse   . Anemia   . Anxiety 09/04/2012  . GERD (gastroesophageal reflux disease)   . GIST  (gastrointestinal stroma tumor), malignant, colon ([place])   . Hx of bipolar disorder   . Malignant GIST ([place]) 09/23/2014   initially diagnosed in 2014  . Pancreatitis   . PE (physical exam), annual   . Pulmonary nodule 05/29/2016  . Tobacco abuse   . Ventral hernia     Past Surgical History:  Procedure Laterality Date  . HERNIA REPAIR    . PARTIAL COLECTOMY    . PORTACATH PLACEMENT Right 03/14/2015   Procedure: INSERTION PORT-A-CATH;  Surgeon: Aviva Signs, MD;  Location: [facility];  Service: General;  Laterality: Right;  . SMALL INTESTINE SURGERY     small bowel resection for obstruction    Family History  Problem Relation Age of Onset  . Cancer Mother   . Hypertension Father   . Cancer Brother     Social History   Social History  . Marital status: Single    Spouse name: [name]  . Number of children: [name]  . Years of education: [name]   Social History Main Topics  . Smoking status: Current Every Day Smoker    Packs/day: 1.00    Years: 36.00    Types: Cigarettes  . Smokeless tobacco: Never Used  . Alcohol use 14.4 oz/week    24 Cans of beer per week     Comment: 2 - 3 cans beer daily  . Drug use: Yes    Types: Cocaine     Comment: yesterday  . Sexual activity: Not Currently   Other Topics Concern  . None   Social History Narrative  . None     PHYSICAL EXAMINATION  ECOG PERFORMANCE STATUS: 1 - Symptomatic but completely ambulatory  Vitals:   11/02/16 1323  BP: (!) 159/88  Pulse: 70  Resp: 18  Temp: 97.8 F (36.6 C)    Constitutional: Well-developed, well-nourished, and in no distress.   HENT:  Head: Normocephalic and atraumatic.  Mouth/Throat: No oropharyngeal exudate. Mucosa moist. Eyes: Pupils are equal, round, and reactive to light. Conjunctivae are normal. No scleral icterus.  Neck: Normal range of motion. Neck supple. No JVD present.  Cardiovascular: Normal rate, regular rhythm and normal heart sounds.  Exam reveals no gallop and no friction rub.     No murmur heard. Pulmonary/Chest: Effort normal and breath sounds normal. No respiratory distress. No wheezes.No rales.  Abdominal: Soft. Bowel sounds are normal. Diffuse tenderness to palpation. Large midline scar.  Musculoskeletal: No edema or tenderness.  Lymphadenopathy:    No cervical or supraclavicular adenopathy.  Neurological: Alert and oriented to person, place, and time. No cranial nerve deficit.  Skin: Skin is warm and dry. No rash noted. No erythema. No pallor.  Psychiatric: Affect and judgment normal.   LABORATORY DATA: CBC    Component Value Date/Time   WBC 9.9 11/02/2016 1424   RBC 3.68 (L) 11/02/2016 1424   HGB 13.3 11/02/2016 1424   HCT 38.7 (L) 11/02/2016 1424   PLT 217 11/02/2016 1424   MCV 105.2 (H) 11/02/2016 1424   MCH 36.1 (H) 11/02/2016 1424   MCHC 34.4 11/02/2016 1424   RDW 15.0 11/02/2016 1424   LYMPHSABS 2.7 11/02/2016 1424   MONOABS 0.6 11/02/2016 1424   EOSABS 0.1 11/02/2016 1424   BASOSABS 0.0 11/02/2016 1424      Chemistry  Component Value Date/Time   NA 142 07/04/2016 0950   K 2.9 (L) 07/04/2016 0950   CL 107 07/04/2016 0950   CO2 26 07/04/2016 0950   BUN 6 07/04/2016 0950   CREATININE 0.87 07/04/2016 0950      Component Value Date/Time   CALCIUM 7.7 (L) 07/04/2016 0950   ALKPHOS 69 07/04/2016 0950   AST 24 07/04/2016 0950   ALT 11 (L) 07/04/2016 0950   BILITOT 0.4 07/04/2016 0950        PENDING LABS:   RADIOGRAPHIC STUDIES:  No results found.   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  He was started on adjuvant Gleevac on 04/13/2013.  He has been followed by [facility] and [facility] in the past for this, but complications associated with his pain medication led to him being seen by [place].  Complicated  by history of EtOH and cocaine abuse with a UDS being POSITIVE for cocaine and negative for opoid on 12/02/2014 leading to the discontinuation of opoid prescribing by [place].  Case has been complicated by noncompliance and multiple missed follow-up appointments.  On Gleevec 400 mg daily beginning on 04/13/2013 with positive response to therapy.  -I have placed an order for repeat CT C/A/P for re-imaging given patient's recent increase in abdominal pain.  -Continue gleevec 400 mg PO daily.  Alcohol abuse, daily use EtOHism, persistent. He states he drinks beer daily.  Polysubstance abuse Polysubstance abuse, including cocaine. Given his severe abdominal pain, I have given him a prescription for oxycodone #30 pills. Will do a urine DOA/alcohol screen today.   RTC in 3 months for follow up with labs.  ORDERS PLACED FOR THIS ENCOUNTER: Orders Placed This Encounter  Procedures  . CT Abdomen Pelvis W Contrast  . CT Chest W Contrast  . CBC with Differential  . Comprehensive metabolic panel  . CBC with Differential  . Comprehensive metabolic panel  . Alcohol / drug initial screening    MEDICATIONS PRESCRIBED THIS ENCOUNTER: Meds ordered this encounter  Medications  . oxyCODONE (OXY IR/ROXICODONE) 5 MG immediate release tablet    Sig: Take 1 tablet (5 mg total) by mouth every 6 (six) hours as needed for severe pain.    Dispense:  30 tablet    Refill:  0    THERAPY PLAN:  Continue Gleevac daily.  All questions were answered. The patient knows to call the clinic with any problems, questions or concerns. We can certainly see the patient much sooner if necessary.  Patient and plan discussed with Dr. Twana First and she is in agreement with the aforementioned.   This note is electronically signed by: Twana First, MD 11/02/2016 3:03 PM

## 2016-11-02 NOTE — Patient Instructions (Signed)
[place] at [facility]  Discharge Instructions:  Port flushed today.  Keep all scheduled appointments and call for any questions or concerns.  _______________________________________________________________  Thank you for choosing [place] at [facility] to provide your oncology and hematology care.  To afford each patient quality time with our providers, please arrive at least 15 minutes before your scheduled appointment.  You need to re-schedule your appointment if you arrive 10 or more minutes late.  We strive to give you quality time with our providers, and arriving late affects you and other patients whose appointments are after yours.  Also, if you no show three or more times for appointments you may be dismissed from the clinic.  Again, thank you for choosing [place] at [place] hope is that these requests will allow you access to exceptional care and in a timely manner. _______________________________________________________________  If you have questions after your visit, please contact our office at [phone] between the hours of 8:30 a.m. and 5:00 p.m. Voicemails left after 4:30 p.m. will not be returned until the following business day. _______________________________________________________________  For prescription refill requests, have your pharmacy contact our office. _______________________________________________________________  Recommendations made by the consultant and any test results will be sent to your referring physician. _______________________________________________________________

## 2016-11-02 NOTE — Progress Notes (Signed)
Port flushed per protocol.  Port site clean and dry with no bruising or swelling noted at site.  Band aid applied.  VSS and left ambulatory.  No complaints of pain voiced with discharge.

## 2016-11-12 ENCOUNTER — Ambulatory Visit (HOSPITAL_COMMUNITY): Payer: Medicaid Other

## 2016-11-13 ENCOUNTER — Telehealth (HOSPITAL_COMMUNITY): Payer: Self-pay

## 2016-11-13 ENCOUNTER — Other Ambulatory Visit (HOSPITAL_COMMUNITY): Payer: Self-pay | Admitting: Emergency Medicine

## 2016-11-13 DIAGNOSIS — C49A3 Gastrointestinal stromal tumor of small intestine: Secondary | ICD-10-CM

## 2016-11-13 DIAGNOSIS — E876 Hypokalemia: Secondary | ICD-10-CM

## 2016-11-13 MED ORDER — POTASSIUM CHLORIDE CRYS ER 20 MEQ PO TBCR: 60.0000 meq | EXTENDED_RELEASE_TABLET | Freq: Every day | ORAL | 2 refills | Status: DC

## 2016-11-13 MED ORDER — MISC. DEVICES MISC: 0 refills | Status: DC

## 2016-11-13 NOTE — Telephone Encounter (Signed)
Please call the patient and let him know that I have told him that I will not give him more than 30 pills a month of the pain meds and he was to use it only in severe pain. He only just got a Rx from me on 11/02/16.

## 2016-11-13 NOTE — Telephone Encounter (Signed)
Patient called and is requesting prescription refill on his oxycodone 5 mg. He states Dr. Talbert Cage gave him a prescription for 30 pills and he has been taking 3 per day. He states he has been out of pain meds for 2 days. He is also requesting prescription for pill crusher for his potassium. Explained to patient I would send message to Dr. Talbert Cage to see if she would refill his pain meds. Prescription for pill crusher sent to pharmacy.

## 2016-11-14 ENCOUNTER — Ambulatory Visit (HOSPITAL_COMMUNITY)
Admission: RE | Admit: 2016-11-14 | Discharge: 2016-11-14 | Disposition: A | Discharge status: Home / Self Care | Payer: Medicaid Other | Source: Ambulatory Visit | Attending: Oncology | Admitting: Oncology

## 2016-11-14 DIAGNOSIS — K439 Ventral hernia without obstruction or gangrene: Secondary | ICD-10-CM | POA: Diagnosis not present

## 2016-11-14 DIAGNOSIS — C49A3 Gastrointestinal stromal tumor of small intestine: Secondary | ICD-10-CM | POA: Diagnosis not present

## 2016-11-14 MED ORDER — IOPAMIDOL (ISOVUE-300) INJECTION 61%
100.0000 mL | Freq: Once | INTRAVENOUS | Status: AC | PRN
  Administered 2016-11-14: 100 mL via INTRAVENOUS

## 2016-11-14 NOTE — Progress Notes (Signed)
Please call the patient and let him know that i've reviewed his CT scans and he has no findings of recurrent/metastatic cancer.

## 2016-11-14 NOTE — Telephone Encounter (Signed)
Before I could call patient, he came by cancer center to pick up prescription for pain medication. Explained to patient that Dr. Talbert Cage said she will not write more than 30 pain pills for a month and he is to only use then for severe pain. He stated that he was in severe pain but he understood. Left floor to go to radiology for CT.

## 2016-11-29 ENCOUNTER — Telehealth (HOSPITAL_COMMUNITY): Payer: Self-pay

## 2016-11-29 DIAGNOSIS — C49A3 Gastrointestinal stromal tumor of small intestine: Secondary | ICD-10-CM

## 2016-11-29 MED ORDER — OXYCODONE HCL 5 MG PO TABS: 5.0000 mg | ORAL_TABLET | Freq: Four times a day (QID) | ORAL | 0 refills | Status: DC | PRN

## 2016-11-29 NOTE — Telephone Encounter (Signed)
Patient called for refill on pain medication. Reviewed with MD, chart checked and refilled. Patient notified to pick up prescription tomorrow. Patient also states his PCP, Dr. Sherrie Sport, wants him to have an EGD and colonoscopy. Patient was referred to Dr. Laural Golden. Patient states he was told he would have to have a friend or family member with him the day of procedure to take him home. Patient states he has no family local and his girlfriend died from a drug overdose 2 months ago. He utilizes [facility] for transportation to his appointments. Patient wants to be kept overnight after having EGD and colonoscopy and go home the next morning using [facility]. That way he will not need to find someone to go with him. Explained to patient the decision was Dr. Olevia Perches. He needs to call his office and explain to them what he just told me. Or call his PCP and explain the situation to him. Gave patient number to Dr. Olevia Perches office. He verbalized understanding.

## 2016-12-03 ENCOUNTER — Ambulatory Visit: Payer: Medicaid Other | Admitting: Nurse Practitioner

## 2016-12-20 ENCOUNTER — Ambulatory Visit: Payer: Medicaid Other | Admitting: Gastroenterology

## 2016-12-27 ENCOUNTER — Other Ambulatory Visit (HOSPITAL_COMMUNITY): Payer: Self-pay | Admitting: Oncology

## 2016-12-27 DIAGNOSIS — C49A3 Gastrointestinal stromal tumor of small intestine: Secondary | ICD-10-CM

## 2016-12-27 MED ORDER — OXYCODONE HCL 5 MG PO TABS: 5.0000 mg | ORAL_TABLET | Freq: Four times a day (QID) | ORAL | 0 refills | Status: DC | PRN

## 2016-12-28 ENCOUNTER — Encounter (HOSPITAL_COMMUNITY): Payer: Self-pay

## 2016-12-28 ENCOUNTER — Encounter (HOSPITAL_COMMUNITY): Payer: Medicaid Other | Attending: Oncology

## 2016-12-28 VITALS — BP 154/81 | HR 73 | Temp 97.6°F

## 2016-12-28 DIAGNOSIS — E876 Hypokalemia: Secondary | ICD-10-CM | POA: Insufficient documentation

## 2016-12-28 DIAGNOSIS — R197 Diarrhea, unspecified: Secondary | ICD-10-CM | POA: Insufficient documentation

## 2016-12-28 DIAGNOSIS — C49A3 Gastrointestinal stromal tumor of small intestine: Secondary | ICD-10-CM | POA: Diagnosis not present

## 2016-12-28 DIAGNOSIS — Z452 Encounter for adjustment and management of vascular access device: Secondary | ICD-10-CM

## 2016-12-28 MED ORDER — SODIUM CHLORIDE 0.9% FLUSH
10.0000 mL | Freq: Once | INTRAVENOUS | Status: AC
  Administered 2016-12-28: 10 mL via INTRAVENOUS

## 2016-12-28 MED ORDER — OXYCODONE HCL 5 MG PO TABS: 5.0000 mg | ORAL_TABLET | Freq: Four times a day (QID) | ORAL | 0 refills | Status: DC | PRN

## 2016-12-28 MED ORDER — HEPARIN SOD (PORK) LOCK FLUSH 100 UNIT/ML IV SOLN
500.0000 [IU] | Freq: Once | INTRAVENOUS | Status: AC
  Administered 2016-12-28: 500 [IU] via INTRAVENOUS
  Filled 2016-12-28: qty 5

## 2016-12-28 NOTE — Progress Notes (Signed)
Patients port flushed per protocol.  Good blood return noted and no complaints of pain with flush.  Port site clean and dry with no bruising or swelling noted at site.  Band aid applied.  VSs with discharge and left ambulatory with no s/s of distress noted.

## 2016-12-28 NOTE — Patient Instructions (Signed)
[facility] at [facility]  Discharge Instructions:  Your port was flushed today. _______________________________________________________________  Thank you for choosing [facility] at [facility] to provide your oncology and hematology care.  To afford each patient quality time with our providers, please arrive at least 15 minutes before your scheduled appointment.  You need to re-schedule your appointment if you arrive 10 or more minutes late.  We strive to give you quality time with our providers, and arriving late affects you and other patients whose appointments are after yours.  Also, if you no show three or more times for appointments you may be dismissed from the clinic.  Again, thank you for choosing [facility] at [facility]. Our hope is that these requests will allow you access to exceptional care and in a timely manner. _______________________________________________________________  If you have questions after your visit, please contact our office at [phone] between the hours of 8:30 a.m. and 5:00 p.m. Voicemails left after 4:30 p.m. will not be returned until the following business day. _______________________________________________________________  For prescription refill requests, have your pharmacy contact our office. _______________________________________________________________  Recommendations made by the consultant and any test results will be sent to your referring physician. _______________________________________________________________ 

## 2017-01-16 ENCOUNTER — Ambulatory Visit: Payer: Medicaid Other | Admitting: Gastroenterology

## 2017-01-16 ENCOUNTER — Encounter: Payer: Self-pay | Admitting: Internal Medicine

## 2017-01-29 ENCOUNTER — Other Ambulatory Visit (HOSPITAL_COMMUNITY): Payer: Self-pay | Admitting: Oncology

## 2017-01-29 DIAGNOSIS — C49A3 Gastrointestinal stromal tumor of small intestine: Secondary | ICD-10-CM

## 2017-01-29 MED ORDER — OXYCODONE HCL 5 MG PO TABS: 5.0000 mg | ORAL_TABLET | Freq: Four times a day (QID) | ORAL | 0 refills | Status: DC | PRN

## 2017-01-30 ENCOUNTER — Ambulatory Visit (HOSPITAL_COMMUNITY): Payer: Medicaid Other

## 2017-01-30 ENCOUNTER — Other Ambulatory Visit (HOSPITAL_COMMUNITY): Payer: Medicaid Other

## 2017-02-01 ENCOUNTER — Encounter (HOSPITAL_COMMUNITY): Payer: Medicaid Other | Attending: Oncology | Admitting: Adult Health

## 2017-02-01 ENCOUNTER — Encounter (HOSPITAL_COMMUNITY): Payer: Self-pay | Admitting: Adult Health

## 2017-02-01 ENCOUNTER — Other Ambulatory Visit: Payer: Self-pay

## 2017-02-01 VITALS — BP 163/84 | HR 71 | Temp 98.4°F | Resp 18 | Ht 73.0 in | Wt 207.0 lb

## 2017-02-01 DIAGNOSIS — C49A3 Gastrointestinal stromal tumor of small intestine: Secondary | ICD-10-CM | POA: Diagnosis present

## 2017-02-01 DIAGNOSIS — R197 Diarrhea, unspecified: Secondary | ICD-10-CM | POA: Insufficient documentation

## 2017-02-01 DIAGNOSIS — R112 Nausea with vomiting, unspecified: Secondary | ICD-10-CM | POA: Diagnosis not present

## 2017-02-01 DIAGNOSIS — F1910 Other psychoactive substance abuse, uncomplicated: Secondary | ICD-10-CM

## 2017-02-01 DIAGNOSIS — E876 Hypokalemia: Secondary | ICD-10-CM | POA: Diagnosis present

## 2017-02-01 LAB — RAPID URINE DRUG SCREEN, HOSP PERFORMED
Amphetamines: NOT DETECTED
BARBITURATES: NOT DETECTED
BENZODIAZEPINES: NOT DETECTED
Cocaine: NOT DETECTED
Opiates: NOT DETECTED
Tetrahydrocannabinol: NOT DETECTED

## 2017-02-01 NOTE — Patient Instructions (Signed)
[place] at [facility] Discharge Instructions  RECOMMENDATIONS MADE BY THE CONSULTANT AND ANY TEST RESULTS WILL BE SENT TO YOUR REFERRING PHYSICIAN.  You were seen today by Mike Craze, NP You had your urine tested today Follow up with CT scan in 4 months Follow up with [place] a few days after scan You will have your port flushed in January and then will continue having it flushed every 6-8 weeks afterwards See schedulers up front for appointments   Thank you for choosing [place] at [facility] to provide your oncology and hematology care.  To afford each patient quality time with our provider, please arrive at least 15 minutes before your scheduled appointment time.    If you have a lab appointment with the [facility] please come in thru the  Main Entrance and check in at the main information desk  You need to re-schedule your appointment should you arrive 10 or more minutes late.  We strive to give you quality time with our providers, and arriving late affects you and other patients whose appointments are after yours.  Also, if you no show three or more times for appointments you may be dismissed from the clinic at the providers discretion.     Again, thank you for choosing [facility].  Our hope is that these requests will decrease the amount of time that you wait before being seen by our physicians.       _____________________________________________________________  Should you have questions after your visit to [facility], please contact our office at [phone] between the hours of 8:30 a.m. and 4:30 p.m.  Voicemails left after 4:30 p.m. will not be returned until the following business day.  For prescription refill requests, have your pharmacy contact our office.       Resources For Cancer Patients and their Caregivers ? American Cancer Society: Can assist with transportation, wigs, general  needs, runs Look Good Feel Better.        (870)169-1977 ? Cancer Care: Provides financial assistance, online support groups, medication/co-pay assistance.  1-800-813-HOPE 239-096-8218) ? [place] Assists [place] cancer patients and their families through emotional , educational and financial support.  (445)756-2389 ? [place] DSS Where to apply for food stamps, Medicaid and utility assistance. 360-842-5951 ? [facility]: Transportation to medical appointments. 317-786-7598 ? Social Security Administration: May apply for disability if have a Stage IV cancer. 830-824-9121 223-210-5843 ? [employer], Disability and Transit Services: Assists with nutrition, care and transit needs. [place] Support Programs: @10RELATIVEDAYS @ > Cancer Support Group  2nd Tuesday of the month 1pm-2pm, Journey Room  > Creative Journey  3rd Tuesday of the month 1130am-1pm, Journey Room  > Look Good Feel Better  1st Wednesday of the month 10am-12 noon, Journey Room (Call [place] to register [phone])

## 2017-02-01 NOTE — Progress Notes (Signed)
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      02/10/2015 Imaging    CT CAP-  Mildly reduced size of left abdominal tumor implants. No new tumor implants identified.      03/14/2015 Procedure    Port-a-cath placed by Dr. Arnoldo Morale      03/14/2016 Imaging    CT CAP- 1. Continued reduction in size of left abdominal peritoneal metastases. No new or progressive metastatic disease in the abdomen or pelvis. 2. Solitary new irregular 1.0 cm nodular opacity in the anterior right upper lobe, highly nonspecific, potentially inflammatory, however cannot exclude pulmonary malignancy (metastatic or primary lung carcinoma). New nonspecific mild subcarinal and right hilar lymphadenopathy, cannot exclude new nodal metastases. PET-CT could be useful for further characterization / biopsy planning, as clinically warranted. Alternatively, a follow-up chest CT with IV contrast could be obtained in 3 months.      07/03/2016 Imaging    Ct chest- No findings specific for metastatic disease in the chest.  Small thoracic lymph nodes measuring up to 9 mm short axis, decreased.  No suspicious pulmonary nodules.        HISTORY OF PRESENT ILLNESS:  (From Dr. Laverle Patter last note on 11/02/16)       INTERVAL HISTORY:  Ms. Ealy 56 y.o. male returns for routine follow-up for metastatic GIST.   Overall, he tells me he has been feeling "okay."   Appetite 50% "but I eat really good."  Weight is stable. Energy levels 50-75%.  He continues to have abdominal pain; he tells me that he is taking the oxycodone that was prescribed for him by Dr. Talbert Cage at his last visit.  He generally takes 1 tab once per day, "but sometimes my pain is worse so I take 2."  He picked up his most recent prescription for oxycodone today.  Endorses occasional dizziness. He doesn't sleep well; he takes Trazadone prescribed/managed by his PCP.    Endorses N&V regularly; vomiting 2-3 times per week.  He does not take anti-emetics; states that he generally wakes up in the middle of the night and vomits. "Then I feel better after I puke."  He has intermittent diarrhea as well; "it comes and goes."   He has some dysphagia and GERD, which has been chronic. He tells me that he is scheduled to see Dr. Gala Romney with GI sometime in January to discuss possible EGD and colonoscopy.    Remains on Gleevec. States that he takes it daily at bedtime.    He continues to drink alcohol; generally drinks 2-6 beers daily. Later he tells me, "but I don't drink every day."    He tells me that he lives in [place] and has limited income. He relies on [employer] local transportation service to bring him to his follow-up visits.  "If I have to get a ride from someone else, then I have to pay them $5 for a ride."      REVIEW  OF SYSTEMS:  Review of Systems  Constitutional: Positive for fatigue.  HENT:   Positive for trouble swallowing.   Eyes: Negative.   Respiratory: Negative.  Negative for cough and shortness of breath.   Cardiovascular: Negative.   Gastrointestinal: Positive for abdominal pain, diarrhea, nausea and vomiting.  Endocrine: Negative.   Genitourinary: Negative.    Skin: Negative.  Negative for itching and rash.  Neurological: Positive for dizziness and extremity weakness.  Hematological: Negative.   Psychiatric/Behavioral: Positive for sleep disturbance.     PAST  MEDICAL/SURGICAL HISTORY:  Past Medical History:  Diagnosis Date  . Alcohol abuse   . Anemia   . Anxiety 09/04/2012  . GERD (gastroesophageal reflux disease)   . GIST (gastrointestinal stroma tumor), malignant, colon ([place])   . Hx of bipolar disorder   . Malignant GIST ([place]) 09/23/2014   initially diagnosed in 2014  . Pancreatitis   . PE (physical exam), annual   . Pulmonary nodule 05/29/2016  . Tobacco abuse   . Ventral hernia    Past Surgical History:  Procedure Laterality Date  . HERNIA REPAIR    . PARTIAL COLECTOMY    . PORTACATH PLACEMENT Right 03/14/2015   Procedure: INSERTION PORT-A-CATH;  Surgeon: Aviva Signs, MD;  Location: [facility];  Service: General;  Laterality: Right;  . SMALL INTESTINE SURGERY     small bowel resection for obstruction     SOCIAL HISTORY:  Social History   Socioeconomic History  . Marital status: Single    Spouse name: Not on file  . Number of children: Not on file  . Years of education: Not on file  . Highest education level: Not on file  Social Needs  . Financial resource strain: Not on file  . Food insecurity - worry: Not on file  . Food insecurity - inability: Not on file  . Transportation needs - medical: Not on file  . Transportation needs - non-medical: Not on file  Occupational History  . Not on file  Tobacco Use  . Smoking status: Current Every Day Smoker    Packs/day: 1.00    Years: 36.00    Pack years: 36.00    Types: Cigarettes  . Smokeless tobacco: Never Used  . Tobacco comment: refused smoking cessation materials.   Substance and Sexual Activity  . Alcohol use: Yes    Alcohol/week: 14.4 oz    Types: 24 Cans of beer per week    Comment: 2 - 3 cans beer daily  . Drug use: Yes    Types: Cocaine    Comment: yesterday  . Sexual activity: Not Currently  Other Topics Concern  . Not on file  Social History Narrative  . Not on file    FAMILY HISTORY:  Family History  Problem Relation Age of Onset  . Cancer Mother     . Hypertension Father   . Cancer Brother     CURRENT MEDICATIONS:  Outpatient Encounter Medications as of 02/01/2017  Medication Sig Note  . Aspirin-Caffeine 845-65 MG PACK Take 1 packet by mouth as needed. 10/07/2014: Rarely for headache  . busPIRone (BUSPAR) 15 MG tablet Take 30 mg by mouth 2 (two) times daily.   . diphenoxylate-atropine (LOMOTIL) 2.5-0.025 MG tablet TAKE ONE TABLET BY MOUTH FOUR TIMES DAILY AS NEEDED FOR DIARRHEA/LOOSE STOOL   . imatinib (GLEEVEC) 400 MG tablet Take 1 tablet (400 mg total) by mouth daily.   Marland Kitchen lidocaine-prilocaine (EMLA) cream Apply 1 application topically as needed.   . mirtazapine (REMERON) 45  MG tablet Take 1 tablet (45 mg total) by mouth at bedtime as needed and may repeat dose one time if needed.   . Misc. Devices MISC Please provide patient with a pill crusher.   Marland Kitchen NEXIUM 20 MG capsule Take 20 mg by mouth 2 (two) times daily.   . Omega-3 Fatty Acids (FISH OIL) 1000 MG CAPS Take 1,000 mg by mouth 3 (three) times daily.   Marland Kitchen omeprazole (PRILOSEC) 20 MG capsule TAKE ONE CAPSULE BY MOUTH ONCE DAILY BEFORE a meal.   . oxyCODONE (OXY IR/ROXICODONE) 5 MG immediate release tablet Take 1 tablet (5 mg total) by mouth every 6 (six) hours as needed for severe pain.   . potassium chloride SA (K-DUR,KLOR-CON) 20 MEQ tablet Take 3 tablets (60 mEq total) by mouth daily.   . traZODone (DESYREL) 50 MG tablet Take 50 mg by mouth at bedtime.   . [DISCONTINUED] busPIRone (BUSPAR) 5 MG tablet Take 1 tablet (5 mg total) by mouth 3 (three) times daily.    No facility-administered encounter medications on file as of 02/01/2017.     ALLERGIES:  No Known Allergies   PHYSICAL EXAM:  ECOG Performance status: 1 - Symptomatic; remains independent   Vitals:   02/01/17 1144  BP: (!) 163/84  Pulse: 71  Resp: 18  Temp: 98.4 F (36.9 C)  SpO2: 98%   Filed Weights   02/01/17 1144  Weight: 207 lb (93.9 kg)    Physical Exam  Constitutional: He is oriented to person,  place, and time and well-developed, well-nourished, and in no distress.  HENT:  Head: Normocephalic.  Mouth/Throat: Oropharynx is clear and moist. No oropharyngeal exudate.  Eyes: Conjunctivae are normal. Pupils are equal, round, and reactive to light. No scleral icterus.  Neck: Normal range of motion. Neck supple.  Cardiovascular: Normal rate and regular rhythm.  Pulmonary/Chest: Effort normal. He has wheezes (mild inspiratory wheezes). He has no rales.  Abdominal: Bowel sounds are normal. There is tenderness.  Very tender to palpation with minimal pressure ("this is how it's always been")  Musculoskeletal: Normal range of motion. He exhibits no edema.  Lymphadenopathy:    He has no cervical adenopathy.       Right: No supraclavicular adenopathy present.       Left: No supraclavicular adenopathy present.  Neurological: He is alert and oriented to person, place, and time. No cranial nerve deficit. Gait normal.  Skin: Skin is warm and dry. No rash noted.  Psychiatric: Mood, memory, affect and judgment normal.  Nursing note and vitals reviewed.    LABORATORY DATA:  I have reviewed the labs as listed.  CBC    Component Value Date/Time   WBC 9.9 11/02/2016 1424   RBC 3.68 (L) 11/02/2016 1424   HGB 13.3 11/02/2016 1424   HCT 38.7 (L) 11/02/2016 1424   PLT 217 11/02/2016 1424   MCV 105.2 (H) 11/02/2016 1424   MCH 36.1 (H) 11/02/2016 1424   MCHC 34.4 11/02/2016 1424   RDW 15.0 11/02/2016 1424   LYMPHSABS 2.7 11/02/2016 1424   MONOABS 0.6 11/02/2016 1424   EOSABS 0.1 11/02/2016 1424   BASOSABS 0.0 11/02/2016 1424   CMP Latest Ref Rng & Units 11/02/2016 07/04/2016 03/14/2016  Glucose 65 - 99 mg/dL 92 102(H) -  BUN 6 - 20 mg/dL 9 6 -  Creatinine 0.61 - 1.24 mg/dL 1.07 0.87 1.50(H)  Sodium 135 - 145 mmol/L 140 142 -  Potassium 3.5 - 5.1 mmol/L 3.3(L) 2.9(L) -  Chloride 101 - 111  mmol/L 105 107 -  CO2 22 - 32 mmol/L 26 26 -  Calcium 8.9 - 10.3 mg/dL 7.9(L) 7.7(L) -  Total Protein  6.5 - 8.1 g/dL 7.0 6.7 -  Total Bilirubin 0.3 - 1.2 mg/dL 0.3 0.4 -  Alkaline Phos 38 - 126 U/L 82 69 -  AST 15 - 41 U/L 26 24 -  ALT 17 - 63 U/L 16(L) 11(L) -    PENDING LABS:    DIAGNOSTIC IMAGING:  *The following radiologic images and reports have been reviewed independently and agree with below findings.  Most recent CT chest/abd/pelvis: 11/14/16 CLINICAL DATA:  Restaging malignant GIST tumor. History of peritoneal metastasis.  EXAM: CT CHEST, ABDOMEN, AND PELVIS WITH CONTRAST  TECHNIQUE: Multidetector CT imaging of the chest, abdomen and pelvis was performed following the standard protocol during bolus administration of intravenous contrast.  CONTRAST:  139mL ISOVUE-300 IOPAMIDOL (ISOVUE-300) INJECTION 61%  COMPARISON:  Chest CT 07/03/2016 and CT chest/abdomen/ pelvis 03/14/2016  FINDINGS: CT CHEST FINDINGS  Cardiovascular: The heart is normal in size. No pericardial effusion. The aorta is normal in caliber. Stable atherosclerotic calcifications. Stable three-vessel coronary artery calcifications.  Mediastinum/Nodes: Stable scattered small mediastinal and hilar lymph nodes. Right sided precarinal lymph node on image number 24 measures 5.5 mm and is stable. 9 mm subcarinal lymph node on image number 28 is stable. 10.5 mm right hilar lymph node on image number 28 is stable.  The esophagus is grossly normal.  Lungs/Pleura: Severe emphysematous changes and pulmonary scarring. No worrisome pulmonary nodules to suggest metastatic disease. Dependent subpleural atelectasis but no infiltrates, edema or effusions.  Musculoskeletal: No significant bony findings. No chest wall mass, supraclavicular or axillary lymphadenopathy. The thyroid gland is grossly normal. The right Port-A-Cath is stable.  CT ABDOMEN PELVIS FINDINGS  Hepatobiliary: No focal hepatic lesions or intrahepatic biliary dilatation. Gallbladder is mildly contracted. No common bile  duct dilatation.  Pancreas: No mass, inflammation or ductal dilatation.  Spleen: Normal size.  No focal lesions.  Adrenals/Urinary Tract: The adrenal glands and kidneys are unremarkable. No ureteral or bladder calculi or mass.  Stomach/Bowel: The stomach, duodenum, small bowel and colon are unremarkable. No acute inflammatory changes, mass lesion or obstructive findings. Stable surgical changes involving the upper sigmoid colon. Stable postoperative changes involving the face small-bowel resection on left mid abdomen. No complicating features. The terminal ileum is normal.  Vascular/Lymphatic: Stable dense atherosclerotic calcifications involving the aorta and iliac arteries. No aneurysm. The branch vessels are patent. The major venous structures are patent.  Reproductive: The prostate gland and seminal vesicles are unremarkable.  Other: Stable anterior abdominal wall hernias. The larger more superior hernia contains part of the transverse colon but no findings for incarceration or obstruction. Smaller hernias containing only fat.  Musculoskeletal: No significant bony findings. Stable sclerotic bone lesions involving the left ilium and left pubic bone and right hip.  IMPRESSION: 1. Stable surgical changes involving and no findings for recurrent tumor or metastatic disease. 2. Stable small mediastinal and right hilar lymph nodes. 3. Severe emphysematous changes but no findings for pulmonary metastatic disease. 4. Anterior abdominal wall hernia containing part of the transverse colon but no complicating features.   Electronically Signed   By: Marijo Sanes M.D.   On: 11/14/2016 14:07    PATHOLOGY:       ASSESSMENT & PLAN:   Stage IV GIST of small intestine:  -diagnosed in 2014. Treated with partial small bowel resection on 09/04/12, but with recurrence of disease with extensive peritoneal  disease.  Started on adjuvant Gleevec on 04/13/13 with positive  response to therapy. [facility] in the past, but continuing his management there was complicated by his polysubstance abuse with alcohol and cocaine.  Urine drug screen in 11/2014 positive for cocaine and negative for opiates, which lead to discontinuation of pt receiving controlled substance prescriptions from our cancer center at that time.  Documented history of non-compliance with multiple missed follow-up appointments. -Restaging CT chest/abd/pelvis on 11/14/16 revealed stable surgical changes with no evidence of recurrent or metastatic disease; stable mediastinal and right hilar lymph nodes; severe emphysematous changes but no findings for pulmonary metastatic disease; anterior abd wall hernia without complicating features.  -Continue Gleevec 400 mg daily. Reports that he is taking medication daily as prescribed.  -Discussed with Dr. Talbert Cage. She recommends repeat CT chest/abd/pelvis 6 months from last scan (which would be ~4 months from now) for restaging; orders placed today.  -Continue port flush every 2 months.  -Return to cancer center in 4 months a few days after scan for follow-up with MD.    Polysubstance abuse:  -History of polysubstance abuse (namely alcohol and cocaine), which we have suspected affects his compliance with Gleevec therapy.  -Urine drug screen on 11/02/16 negative. Per Dr. Talbert Cage, she agreed to give him #30 oxycodone monthly only and perform random drug screens. If he ever tests positive for illicit substances or tests negative for opiates, we will no longer prescribe controlled substances for him.   -He continues to drink alcohol regularly; he does not have interest in stopping drinking.  -Repeat urine drug screen today.    Abdominal pain:  -Chronic. Managed with oxycodone 1-2 tabs daily.  He understands that based on previous agreement with Dr. Talbert Cage that he will only receive #30 pills  oxycodone per month; we will not increase the number of pills he receives.  He also understands that he will need to submit to random urine drug screening.  If his urine drug screen results are not appropriate, then we will no longer prescribe controlled substances for him.   -No oxycodone refills needed at this time.    N&V:  -Recommended he take anti-emetics to prevent vomiting, as frequent vomiting has potential long term adverse effects.       Dispo:  -Urine drug screen today -Restaging CT chest/abd/pelvis in ~4 months.  -Return to cancer center in ~4 months a few days after scans for follow-up with MD. -Continue port flushes every 2 months.    All questions were answered to patient's stated satisfaction. Encouraged patient to call with any new concerns or questions before his next visit to the cancer center and we can certain see him sooner, if needed.    Plan of care discussed with Dr. Talbert Cage, who agrees with the above aforementioned.    Orders placed this encounter:  Orders Placed This Encounter  Procedures  . CT Chest W Contrast  . CT Abdomen Pelvis W Contrast  . Urine rapid drug screen (hosp performed)  . CBC with Differential/Platelet  . Comprehensive metabolic panel      Mike Craze, NP [place] [phone]

## 2017-02-14 ENCOUNTER — Encounter (HOSPITAL_COMMUNITY): Payer: Self-pay | Admitting: *Deleted

## 2017-02-28 ENCOUNTER — Encounter (HOSPITAL_COMMUNITY): Payer: Medicaid Other

## 2017-03-15 ENCOUNTER — Ambulatory Visit: Payer: Medicaid Other | Admitting: Gastroenterology

## 2017-03-29 ENCOUNTER — Ambulatory Visit: Payer: Medicaid Other | Admitting: Gastroenterology

## 2017-03-29 ENCOUNTER — Encounter: Payer: Self-pay | Admitting: Gastroenterology

## 2017-03-29 ENCOUNTER — Encounter: Payer: Self-pay | Admitting: *Deleted

## 2017-03-29 ENCOUNTER — Other Ambulatory Visit: Payer: Self-pay | Admitting: *Deleted

## 2017-03-29 DIAGNOSIS — G8929 Other chronic pain: Secondary | ICD-10-CM | POA: Diagnosis not present

## 2017-03-29 DIAGNOSIS — R1013 Epigastric pain: Secondary | ICD-10-CM | POA: Diagnosis not present

## 2017-03-29 DIAGNOSIS — K529 Noninfective gastroenteritis and colitis, unspecified: Secondary | ICD-10-CM | POA: Diagnosis not present

## 2017-03-29 DIAGNOSIS — Z1211 Encounter for screening for malignant neoplasm of colon: Secondary | ICD-10-CM

## 2017-03-29 MED ORDER — CLENPIQ 10-3.5-12 MG-GM -GM/160ML PO SOLN: 1.0000 | Freq: Once | ORAL | 0 refills | Status: AC

## 2017-03-29 MED ORDER — DICYCLOMINE HCL 10 MG PO CAPS
10.0000 mg | ORAL_CAPSULE | Freq: Three times a day (TID) | ORAL | 3 refills | Status: DC
Start: 2017-03-29 — End: 2018-01-07
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
## 2017-03-29 NOTE — Patient Instructions (Addendum)
Let's try taking Bentyl one capsule before meals and at bedtime. Watch for constipation, confusion, drowsiness.   We will schedule you for a colonoscopy, upper endoscopy, and possible dilation with Dr. Gala Romney in the near future.  We will see you back in follow-up after the procedure!  It was a pleasure to see you today. I strive to create trusting relationships with patients to provide genuine, compassionate, and quality care. I value your feedback. If you receive a survey regarding your visit,  I greatly appreciate you the taking time to fill this out.   Annitta Needs, PhD, ANP-BC [facility]

## 2017-04-02 ENCOUNTER — Encounter: Payer: Self-pay | Admitting: *Deleted

## 2017-04-02 ENCOUNTER — Telehealth: Payer: Self-pay | Admitting: *Deleted

## 2017-04-02 NOTE — Telephone Encounter (Signed)
Pre-op scheduled for 04/23/17 at 11:00am. Letter mailed to pt.

## 2017-04-08 ENCOUNTER — Telehealth: Payer: Self-pay | Admitting: Gastroenterology

## 2017-04-08 NOTE — Assessment & Plan Note (Signed)
Notes since small bowel resection. Trial of Bentyl. No prior colonoscopy. No alarm features.  Proceed with TCS with Dr. Gala Romney in near future: the risks, benefits, and alternatives have been discussed with the patient in detail. The patient states understanding and desires to proceed. Propofol due to polypharmacy Bentyl prescription provided

## 2017-04-08 NOTE — Telephone Encounter (Signed)
Please make sure patient has follow-up in our office in about 3 months. Thanks!

## 2017-04-08 NOTE — Assessment & Plan Note (Signed)
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 He is followed by Oncology. Most recent CT in Oct 2018 with stable findings. He does have a large anterior abdominal wall hernia with part of transverse colon but no complicating features. He has never had an upper endoscopy. Associated N/V with epigastric pain, worsening with eating. Denying solid food dysphagia but endorses pill dysphagia.  Proceed with upper endoscopy/dilation in the near future with Dr. Gala Romney. The risks, benefits, and alternatives have been discussed in detail with patient. They have stated understanding and desire to proceed.  Propofol due to polypharmacy Continue PPI  Recommend alcohol cessation Follow-up after procedures

## 2017-04-09 ENCOUNTER — Encounter: Payer: Self-pay | Admitting: Internal Medicine

## 2017-04-09 NOTE — Telephone Encounter (Signed)
PATIENT SCHEDULED AND LETTER SENT  °

## 2017-04-09 NOTE — Progress Notes (Signed)
cc'ed to pcp °

## 2017-04-19 ENCOUNTER — Telehealth (HOSPITAL_COMMUNITY): Payer: Self-pay | Admitting: Adult Health

## 2017-04-19 NOTE — Patient Instructions (Signed)
[name]  04/19/2017     @PREFPERIOPPHARMACY @   Your procedure is scheduled on  04/29/2017 .  Report to [facility] at  900   A.M.  Call this number if you have problems the morning of surgery:  [phone]   Remember:  Do not eat food or drink liquids after midnight.  Take these medicines the morning of surgery with A SIP OF WATER  Buspar, gleevac, toradol, nexium.   Do not wear jewelry, make-up or nail polish.  Do not wear lotions, powders, or perfumes, or deodorant.  Do not shave 48 hours prior to surgery.  Men may shave face and neck.  Do not bring valuables to the hospital.  [facility] is not responsible for any belongings or valuables.  Contacts, dentures or bridgework may not be worn into surgery.  Leave your suitcase in the car.  After surgery it may be brought to your room.  For patients admitted to the hospital, discharge time will be determined by your treatment team.  Patients discharged the day of surgery will not be allowed to drive home.   Name and phone number of your driver:   family Special instructions:  Follow the diet and prep instructions given to you by Dr Roseanne Kaufman office.  Please read over the following fact sheets that you were given. Anesthesia Post-op Instructions and Care and Recovery After Surgery       Esophagogastroduodenoscopy Esophagogastroduodenoscopy (EGD) is a procedure to examine the lining of the esophagus, stomach, and first part of the small intestine (duodenum). This procedure is done to check for problems such as inflammation, bleeding, ulcers, or growths. During this procedure, a long, flexible, lighted tube with a camera attached (endoscope) is inserted down the throat. Tell a health care provider about:  Any allergies you have.  All medicines you are taking, including vitamins, herbs, eye drops, creams, and over-the-counter medicines.  Any problems you or family members have had with  anesthetic medicines.  Any blood disorders you have.  Any surgeries you have had.  Any medical conditions you have.  Whether you are pregnant or may be pregnant. What are the risks? Generally, this is a safe procedure. However, problems may occur, including:  Infection.  Bleeding.  A tear (perforation) in the esophagus, stomach, or duodenum.  Trouble breathing.  Excessive sweating.  Spasms of the larynx.  A slowed heartbeat.  Low blood pressure.  What happens before the procedure?  Follow instructions from your health care provider about eating or drinking restrictions.  Ask your health care provider about: ? Changing or stopping your regular medicines. This is especially important if you are taking diabetes medicines or blood thinners. ? Taking medicines such as aspirin and ibuprofen. These medicines can thin your blood. Do not take these medicines before your procedure if your health care provider instructs you not to.  Plan to have someone take you home after the procedure.  If you wear dentures, be ready to remove them before the procedure. What happens during the procedure?  To reduce your risk of infection, your health care team will wash or sanitize their hands.  An IV tube will be put in a vein in your hand or arm. You will get medicines and fluids through this tube.  You will be given one or more of the following: ? A medicine to help you relax (sedative). ? A medicine to numb  the area (local anesthetic). This medicine may be sprayed into your throat. It will make you feel more comfortable and keep you from gagging or coughing during the procedure. ? A medicine for pain.  A mouth guard may be placed in your mouth to protect your teeth and to keep you from biting on the endoscope.  You will be asked to lie on your left side.  The endoscope will be lowered down your throat into your esophagus, stomach, and duodenum.  Air will be put into the endoscope.  This will help your health care provider see better.  The lining of your esophagus, stomach, and duodenum will be examined.  Your health care provider may: ? Take a tissue sample so it can be looked at in a lab (biopsy). ? Remove growths. ? Remove objects (foreign bodies) that are stuck. ? Treat any bleeding with medicines or other devices that stop tissue from bleeding. ? Widen (dilate) or stretch narrowed areas of your esophagus and stomach.  The endoscope will be taken out. The procedure may vary among health care providers and hospitals. What happens after the procedure?  Your blood pressure, heart rate, breathing rate, and blood oxygen level will be monitored often until the medicines you were given have worn off.  Do not eat or drink anything until the numbing medicine has worn off and your gag reflex has returned. This information is not intended to replace advice given to you by your health care provider. Make sure you discuss any questions you have with your health care provider. Document Released: 06/01/2004 Document Revised: 07/07/2015 Document Reviewed: 12/23/2014 Elsevier Interactive Patient Education  2018 [employer]. Esophagogastroduodenoscopy, Care After Refer to this sheet in the next few weeks. These instructions provide you with information about caring for yourself after your procedure. Your health care provider may also give you more specific instructions. Your treatment has been planned according to current medical practices, but problems sometimes occur. Call your health care provider if you have any problems or questions after your procedure. What can I expect after the procedure? After the procedure, it is common to have:  A sore throat.  Nausea.  Bloating.  Dizziness.  Fatigue.  Follow these instructions at home:  Do not eat or drink anything until the numbing medicine (local anesthetic) has worn off and your gag reflex has returned. You will know  that the local anesthetic has worn off when you can swallow comfortably.  Do not drive for 24 hours if you received a medicine to help you relax (sedative).  If your health care provider took a tissue sample for testing during the procedure, make sure to get your test results. This is your responsibility. Ask your health care provider or the department performing the test when your results will be ready.  Keep all follow-up visits as told by your health care provider. This is important. Contact a health care provider if:  You cannot stop coughing.  You are not urinating.  You are urinating less than usual. Get help right away if:  You have trouble swallowing.  You cannot eat or drink.  You have throat or chest pain that gets worse.  You are dizzy or light-headed.  You faint.  You have nausea or vomiting.  You have chills.  You have a fever.  You have severe abdominal pain.  You have black, tarry, or bloody stools. This information is not intended to replace advice given to you by your health care provider. Make sure  you discuss any questions you have with your health care provider. Document Released: 01/16/2012 Document Revised: 07/07/2015 Document Reviewed: 12/23/2014 Elsevier Interactive Patient Education  2018 [employer].  Esophageal Dilatation Esophageal dilatation is a procedure to open a blocked or narrowed part of the esophagus. The esophagus is the long tube in your throat that carries food and liquid from your mouth to your stomach. The procedure is also called esophageal dilation. You may need this procedure if you have a buildup of scar tissue in your esophagus that makes it difficult, painful, or even impossible to swallow. This can be caused by gastroesophageal reflux disease (GERD). In rare cases, people need this procedure because they have cancer of the esophagus or a problem with the way food moves through the esophagus. Sometimes you may need to have  another dilatation to enlarge the opening of the esophagus gradually. Tell a health care provider about:  Any allergies you have.  All medicines you are taking, including vitamins, herbs, eye drops, creams, and over-the-counter medicines.  Any problems you or family members have had with anesthetic medicines.  Any blood disorders you have.  Any surgeries you have had.  Any medical conditions you have.  Any antibiotic medicines you are required to take before dental procedures. What are the risks? Generally, this is a safe procedure. However, problems can occur and include:  Bleeding from a tear in the lining of the esophagus.  A hole (perforation) in the esophagus.  What happens before the procedure?  Do not eat or drink anything after midnight on the night before the procedure or as directed by your health care provider.  Ask your health care provider about changing or stopping your regular medicines. This is especially important if you are taking diabetes medicines or blood thinners.  Plan to have someone take you home after the procedure. What happens during the procedure?  You will be given a medicine that makes you relaxed and sleepy (sedative).  A medicine may be sprayed or gargled to numb the back of the throat.  Your health care provider can use various instruments to do an esophageal dilatation. During the procedure, the instrument used will be placed in your mouth and passed down into your esophagus. Options include: ? Simple dilators. This instrument is carefully placed in the esophagus to stretch it. ? Guided wire bougies. In this method, a flexible tube (endoscope) is used to insert a wire into the esophagus. The dilator is passed over this wire to enlarge the esophagus. Then the wire is removed. ? Balloon dilators. An endoscope with a small balloon at the end is passed down into the esophagus. Inflating the balloon gently stretches the esophagus and opens it  up. What happens after the procedure?  Your blood pressure, heart rate, breathing rate, and blood oxygen level will be monitored often until the medicines you were given have worn off.  Your throat may feel slightly sore and will probably still feel numb. This will improve slowly over time.  You will not be allowed to eat or drink until the throat numbness has resolved.  If this is a same-day procedure, you may be allowed to go home once you have been able to drink, urinate, and sit on the edge of the bed without nausea or dizziness.  If this is a same-day procedure, you should have a friend or family member with you for the next 24 hours after the procedure. This information is not intended to replace advice given to  you by your health care provider. Make sure you discuss any questions you have with your health care provider. Document Released: 03/22/2005 Document Revised: 07/07/2015 Document Reviewed: 06/10/2013 Elsevier Interactive Patient Education  [employer].  Colonoscopy, Adult A colonoscopy is an exam to look at the large intestine. It is done to check for problems, such as:  Lumps (tumors).  Growths (polyps).  Swelling (inflammation).  Bleeding.  What happens before the procedure? Eating and drinking Follow instructions from your doctor about eating and drinking. These instructions may include:  A few days before the procedure - follow a low-fiber diet. ? Avoid nuts. ? Avoid seeds. ? Avoid dried fruit. ? Avoid raw fruits. ? Avoid vegetables.  1-3 days before the procedure - follow a clear liquid diet. Avoid liquids that have red or purple dye. Drink only clear liquids, such as: ? Clear broth or bouillon. ? Black coffee or tea. ? Clear juice. ? Clear soft drinks or sports drinks. ? Gelatin dessert. ? Popsicles.  On the day of the procedure - do not eat or drink anything during the 2 hours before the procedure.  Bowel prep If you were prescribed an oral  bowel prep:  Take it as told by your doctor. Starting the day before your procedure, you will need to drink a lot of liquid. The liquid will cause you to poop (have bowel movements) until your poop is almost clear or light green.  If your skin or butt gets irritated from diarrhea, you may: ? Wipe the area with wipes that have medicine in them, such as adult wet wipes with aloe and vitamin E. ? Put something on your skin that soothes the area, such as petroleum jelly.  If you throw up (vomit) while drinking the bowel prep, take a break for up to 60 minutes. Then begin the bowel prep again. If you keep throwing up and you cannot take the bowel prep without throwing up, call your doctor.  General instructions  Ask your doctor about changing or stopping your normal medicines. This is important if you take diabetes medicines or blood thinners.  Plan to have someone take you home from the hospital or clinic. What happens during the procedure?  An IV tube may be put into one of your veins.  You will be given medicine to help you relax (sedative).  To reduce your risk of infection: ? Your doctors will wash their hands. ? Your anal area will be washed with soap.  You will be asked to lie on your side with your knees bent.  Your doctor will get a long, thin, flexible tube ready. The tube will have a camera and a light on the end.  The tube will be put into your anus.  The tube will be gently put into your large intestine.  Air will be delivered into your large intestine to keep it open. You may feel some pressure or cramping.  The camera will be used to take photos.  A small tissue sample may be removed from your body to be looked at under a microscope (biopsy). If any possible problems are found, the tissue will be sent to a lab for testing.  If small growths are found, your doctor may remove them and have them checked for cancer.  The tube that was put into your anus will be slowly  removed. The procedure may vary among doctors and hospitals. What happens after the procedure?  Your doctor will check on you often until  the medicines you were given have worn off.  Do not drive for 24 hours after the procedure.  You may have a small amount of blood in your poop.  You may pass gas.  You may have mild cramps or bloating in your belly (abdomen).  It is up to you to get the results of your procedure. Ask your doctor, or the department performing the procedure, when your results will be ready. This information is not intended to replace advice given to you by your health care provider. Make sure you discuss any questions you have with your health care provider. Document Released: 03/03/2010 Document Revised: 11/30/2015 Document Reviewed: 04/12/2015 Elsevier Interactive Patient Education  2017 [employer]  Colonoscopy, Adult, Care After This sheet gives you information about how to care for yourself after your procedure. Your health care provider may also give you more specific instructions. If you have problems or questions, contact your health care provider. What can I expect after the procedure? After the procedure, it is common to have:  A small amount of blood in your stool for 24 hours after the procedure.  Some gas.  Mild abdominal cramping or bloating.  Follow these instructions at home: General instructions   For the first 24 hours after the procedure: ? Do not drive or use machinery. ? Do not sign important documents. ? Do not drink alcohol. ? Do your regular daily activities at a slower pace than normal. ? Eat soft, easy-to-digest foods. ? Rest often.  Take over-the-counter or prescription medicines only as told by your health care provider.  It is up to you to get the results of your procedure. Ask your health care provider, or the department performing the procedure, when your results will be ready. Relieving cramping and bloating  Try  walking around when you have cramps or feel bloated.  Apply heat to your abdomen as told by your health care provider. Use a heat source that your health care provider recommends, such as a moist heat pack or a heating pad. ? Place a towel between your skin and the heat source. ? Leave the heat on for 20-30 minutes. ? Remove the heat if your skin turns bright red. This is especially important if you are unable to feel pain, heat, or cold. You may have a greater risk of getting burned. Eating and drinking  Drink enough fluid to keep your urine clear or pale yellow.  Resume your normal diet as instructed by your health care provider. Avoid heavy or fried foods that are hard to digest.  Avoid drinking alcohol for as long as instructed by your health care provider. Contact a health care provider if:  You have blood in your stool 2-3 days after the procedure. Get help right away if:  You have more than a small spotting of blood in your stool.  You pass large blood clots in your stool.  Your abdomen is swollen.  You have nausea or vomiting.  You have a fever.  You have increasing abdominal pain that is not relieved with medicine. This information is not intended to replace advice given to you by your health care provider. Make sure you discuss any questions you have with your health care provider. Document Released: 09/13/2003 Document Revised: 10/24/2015 Document Reviewed: 04/12/2015 Elsevier Interactive Patient Education  2018 [place] Anesthesia is a term that refers to techniques, procedures, and medicines that help a person stay safe and comfortable during a medical procedure. Monitored  anesthesia care, or sedation, is one type of anesthesia. Your anesthesia specialist may recommend sedation if you will be having a procedure that does not require you to be unconscious, such as:  Cataract surgery.  A dental procedure.  A biopsy.  A  colonoscopy.  During the procedure, you may receive a medicine to help you relax (sedative). There are three levels of sedation:  Mild sedation. At this level, you may feel awake and relaxed. You will be able to follow directions.  Moderate sedation. At this level, you will be sleepy. You may not remember the procedure.  Deep sedation. At this level, you will be asleep. You will not remember the procedure.  The more medicine you are given, the deeper your level of sedation will be. Depending on how you respond to the procedure, the anesthesia specialist may change your level of sedation or the type of anesthesia to fit your needs. An anesthesia specialist will monitor you closely during the procedure. Let your health care provider know about:  Any allergies you have.  All medicines you are taking, including vitamins, herbs, eye drops, creams, and over-the-counter medicines.  Any use of steroids (by mouth or as a cream).  Any problems you or family members have had with sedatives and anesthetic medicines.  Any blood disorders you have.  Any surgeries you have had.  Any medical conditions you have, such as sleep apnea.  Whether you are pregnant or may be pregnant.  Any use of cigarettes, alcohol, or street drugs. What are the risks? Generally, this is a safe procedure. However, problems may occur, including:  Getting too much medicine (oversedation).  Nausea.  Allergic reaction to medicines.  Trouble breathing. If this happens, a breathing tube may be used to help with breathing. It will be removed when you are awake and breathing on your own.  Heart trouble.  Lung trouble.  Before the procedure Staying hydrated Follow instructions from your health care provider about hydration, which may include:  Up to 2 hours before the procedure - you may continue to drink clear liquids, such as water, clear fruit juice, black coffee, and plain tea.  Eating and drinking  restrictions Follow instructions from your health care provider about eating and drinking, which may include:  8 hours before the procedure - stop eating heavy meals or foods such as meat, fried foods, or fatty foods.  6 hours before the procedure - stop eating light meals or foods, such as toast or cereal.  6 hours before the procedure - stop drinking milk or drinks that contain milk.  2 hours before the procedure - stop drinking clear liquids.  Medicines Ask your health care provider about:  Changing or stopping your regular medicines. This is especially important if you are taking diabetes medicines or blood thinners.  Taking medicines such as aspirin and ibuprofen. These medicines can thin your blood. Do not take these medicines before your procedure if your health care provider instructs you not to.  Tests and exams  You will have a physical exam.  You may have blood tests done to show: ? How well your kidneys and liver are working. ? How well your blood can clot.  General instructions  Plan to have someone take you home from the hospital or clinic.  If you will be going home right after the procedure, plan to have someone with you for 24 hours.  What happens during the procedure?  Your blood pressure, heart rate, breathing, level of pain  and overall condition will be monitored.  An IV tube will be inserted into one of your veins.  Your anesthesia specialist will give you medicines as needed to keep you comfortable during the procedure. This may mean changing the level of sedation.  The procedure will be performed. After the procedure  Your blood pressure, heart rate, breathing rate, and blood oxygen level will be monitored until the medicines you were given have worn off.  Do not drive for 24 hours if you received a sedative.  You may: ? Feel sleepy, clumsy, or nauseous. ? Feel forgetful about what happened after the procedure. ? Have a sore throat if you had a  breathing tube during the procedure. ? Vomit. This information is not intended to replace advice given to you by your health care provider. Make sure you discuss any questions you have with your health care provider. Document Released: 10/25/2004 Document Revised: 07/08/2015 Document Reviewed: 05/22/2015 Elsevier Interactive Patient Education  2018 [place], Care After These instructions provide you with information about caring for yourself after your procedure. Your health care provider may also give you more specific instructions. Your treatment has been planned according to current medical practices, but problems sometimes occur. Call your health care provider if you have any problems or questions after your procedure. What can I expect after the procedure? After your procedure, it is common to:  Feel sleepy for several hours.  Feel clumsy and have poor balance for several hours.  Feel forgetful about what happened after the procedure.  Have poor judgment for several hours.  Feel nauseous or vomit.  Have a sore throat if you had a breathing tube during the procedure.  Follow these instructions at home: For at least 24 hours after the procedure:   Do not: ? Participate in activities in which you could fall or become injured. ? Drive. ? Use heavy machinery. ? Drink alcohol. ? Take sleeping pills or medicines that cause drowsiness. ? Make important decisions or sign legal documents. ? Take care of children on your own.  Rest. Eating and drinking  Follow the diet that is recommended by your health care provider.  If you vomit, drink water, juice, or soup when you can drink without vomiting.  Make sure you have little or no nausea before eating solid foods. General instructions  Have a responsible adult stay with you until you are awake and alert.  Take over-the-counter and prescription medicines only as told by your health care  provider.  If you smoke, do not smoke without supervision.  Keep all follow-up visits as told by your health care provider. This is important. Contact a health care provider if:  You keep feeling nauseous or you keep vomiting.  You feel light-headed.  You develop a rash.  You have a fever. Get help right away if:  You have trouble breathing. This information is not intended to replace advice given to you by your health care provider. Make sure you discuss any questions you have with your health care provider. Document Released: 05/22/2015 Document Revised: 09/21/2015 Document Reviewed: 05/22/2015 Elsevier Interactive Patient Education  [employer].

## 2017-04-19 NOTE — Telephone Encounter (Signed)
PC FROM [email or web address]  STATING THEY CANT NOT GET IN CONTACT WITH PT TO DELIVER HIS GLEEVAC. I GAVE THEM HIS SISTERS CONTACT INFO.

## 2017-04-23 ENCOUNTER — Encounter (HOSPITAL_COMMUNITY)
Admission: RE | Admit: 2017-04-23 | Discharge: 2017-04-23 | Disposition: A | Discharge status: Home / Self Care | Payer: Medicaid Other | Source: Ambulatory Visit | Attending: Internal Medicine | Admitting: Internal Medicine

## 2017-04-23 ENCOUNTER — Other Ambulatory Visit: Payer: Self-pay

## 2017-04-23 DIAGNOSIS — Z01810 Encounter for preprocedural cardiovascular examination: Secondary | ICD-10-CM | POA: Insufficient documentation

## 2017-04-23 DIAGNOSIS — R000 Tachycardia, unspecified: Secondary | ICD-10-CM | POA: Diagnosis not present

## 2017-04-23 DIAGNOSIS — Z01812 Encounter for preprocedural laboratory examination: Secondary | ICD-10-CM | POA: Insufficient documentation

## 2017-04-23 LAB — CBC WITH DIFFERENTIAL/PLATELET
Basophils Absolute: 0 10*3/uL (ref 0.0–0.1)
Basophils Relative: 0 %
EOS PCT: 1 %
Eosinophils Absolute: 0.1 10*3/uL (ref 0.0–0.7)
HEMATOCRIT: 44.2 % (ref 39.0–52.0)
HEMOGLOBIN: 14.7 g/dL (ref 13.0–17.0)
LYMPHS ABS: 1.7 10*3/uL (ref 0.7–4.0)
LYMPHS PCT: 27 %
MCH: 35.3 pg — AB (ref 26.0–34.0)
MCHC: 33.3 g/dL (ref 30.0–36.0)
MCV: 106 fL — AB (ref 78.0–100.0)
Monocytes Absolute: 1 10*3/uL (ref 0.1–1.0)
Monocytes Relative: 15 %
NEUTROS ABS: 3.6 10*3/uL (ref 1.7–7.7)
Neutrophils Relative %: 57 %
PLATELETS: 63 10*3/uL — AB (ref 150–400)
RBC: 4.17 MIL/uL — ABNORMAL LOW (ref 4.22–5.81)
RDW: 14.8 % (ref 11.5–15.5)
WBC: 6.4 10*3/uL (ref 4.0–10.5)

## 2017-04-23 LAB — BASIC METABOLIC PANEL
Anion gap: 12 (ref 5–15)
BUN: 6 mg/dL (ref 6–20)
CHLORIDE: 104 mmol/L (ref 101–111)
CO2: 21 mmol/L — AB (ref 22–32)
Calcium: 8.6 mg/dL — ABNORMAL LOW (ref 8.9–10.3)
Creatinine, Ser: 0.95 mg/dL (ref 0.61–1.24)
GFR calc Af Amer: >60 mL/min (ref >60)
GFR calc non Af Amer: >60 mL/min (ref >60)
GLUCOSE: 119 mg/dL — AB (ref 65–99)
POTASSIUM: 4.1 mmol/L (ref 3.5–5.1)
Sodium: 137 mmol/L (ref 135–145)

## 2017-04-25 NOTE — Progress Notes (Signed)
Fatty liver on prior CTs. No known liver disease. Thrombocytopenia noted intermittently over past few years (last noted in March 2017 with platelets 126 and Jan 2017 with platelets 62). On [place], which can cause thrombocytopenia, and he drinks alcohol daily, likely contributing as well. Will follow-up as outpatient after procedures to further evaluate with possible elastography

## 2017-04-26 ENCOUNTER — Telehealth: Payer: Self-pay | Admitting: *Deleted

## 2017-04-26 NOTE — Telephone Encounter (Signed)
Opened in error

## 2017-04-29 ENCOUNTER — Encounter (HOSPITAL_COMMUNITY)
Admission: RE | Disposition: A | Discharge status: Home / Self Care | Payer: Self-pay | Source: Ambulatory Visit | Attending: Internal Medicine

## 2017-04-29 ENCOUNTER — Ambulatory Visit (HOSPITAL_COMMUNITY): Payer: Medicaid Other | Admitting: Anesthesiology

## 2017-04-29 ENCOUNTER — Ambulatory Visit (HOSPITAL_COMMUNITY): Payer: Medicaid Other

## 2017-04-29 ENCOUNTER — Encounter (HOSPITAL_COMMUNITY): Payer: Self-pay

## 2017-04-29 ENCOUNTER — Ambulatory Visit (HOSPITAL_COMMUNITY)
Admission: RE | Admit: 2017-04-29 | Discharge: 2017-04-29 | Disposition: A | Discharge status: Home / Self Care | Payer: Medicaid Other | Source: Ambulatory Visit | Attending: Internal Medicine | Admitting: Internal Medicine

## 2017-04-29 ENCOUNTER — Other Ambulatory Visit: Payer: Self-pay

## 2017-04-29 ENCOUNTER — Encounter (HOSPITAL_COMMUNITY): Payer: Self-pay | Admitting: Emergency Medicine

## 2017-04-29 DIAGNOSIS — R1314 Dysphagia, pharyngoesophageal phase: Secondary | ICD-10-CM | POA: Diagnosis not present

## 2017-04-29 DIAGNOSIS — Z1211 Encounter for screening for malignant neoplasm of colon: Secondary | ICD-10-CM | POA: Insufficient documentation

## 2017-04-29 DIAGNOSIS — Z79899 Other long term (current) drug therapy: Secondary | ICD-10-CM | POA: Insufficient documentation

## 2017-04-29 DIAGNOSIS — Z452 Encounter for adjustment and management of vascular access device: Secondary | ICD-10-CM

## 2017-04-29 DIAGNOSIS — K3189 Other diseases of stomach and duodenum: Secondary | ICD-10-CM | POA: Diagnosis not present

## 2017-04-29 DIAGNOSIS — F419 Anxiety disorder, unspecified: Secondary | ICD-10-CM | POA: Diagnosis not present

## 2017-04-29 DIAGNOSIS — K5730 Diverticulosis of large intestine without perforation or abscess without bleeding: Secondary | ICD-10-CM | POA: Diagnosis not present

## 2017-04-29 DIAGNOSIS — F319 Bipolar disorder, unspecified: Secondary | ICD-10-CM | POA: Diagnosis not present

## 2017-04-29 DIAGNOSIS — Z1212 Encounter for screening for malignant neoplasm of rectum: Secondary | ICD-10-CM | POA: Diagnosis not present

## 2017-04-29 DIAGNOSIS — K219 Gastro-esophageal reflux disease without esophagitis: Secondary | ICD-10-CM | POA: Insufficient documentation

## 2017-04-29 DIAGNOSIS — K228 Other specified diseases of esophagus: Secondary | ICD-10-CM | POA: Diagnosis not present

## 2017-04-29 DIAGNOSIS — K766 Portal hypertension: Secondary | ICD-10-CM | POA: Insufficient documentation

## 2017-04-29 DIAGNOSIS — R1310 Dysphagia, unspecified: Secondary | ICD-10-CM | POA: Diagnosis not present

## 2017-04-29 DIAGNOSIS — D123 Benign neoplasm of transverse colon: Secondary | ICD-10-CM | POA: Diagnosis not present

## 2017-04-29 DIAGNOSIS — C49A3 Gastrointestinal stromal tumor of small intestine: Secondary | ICD-10-CM

## 2017-04-29 DIAGNOSIS — R1013 Epigastric pain: Secondary | ICD-10-CM

## 2017-04-29 DIAGNOSIS — K222 Esophageal obstruction: Secondary | ICD-10-CM | POA: Diagnosis not present

## 2017-04-29 DIAGNOSIS — G8929 Other chronic pain: Secondary | ICD-10-CM

## 2017-04-29 HISTORY — PX: MALONEY DILATION: SHX5535

## 2017-04-29 HISTORY — PX: POLYPECTOMY: SHX5525

## 2017-04-29 HISTORY — PX: ESOPHAGOGASTRODUODENOSCOPY (EGD) WITH PROPOFOL: SHX5813

## 2017-04-29 HISTORY — PX: COLONOSCOPY WITH PROPOFOL: SHX5780

## 2017-04-29 HISTORY — PX: BIOPSY: SHX5522

## 2017-04-29 LAB — CBC WITH DIFFERENTIAL/PLATELET
Basophils Absolute: 0.1 10*3/uL (ref 0.0–0.1)
Basophils Relative: 1 %
Eosinophils Absolute: 0.1 10*3/uL (ref 0.0–0.7)
Eosinophils Relative: 2 %
HEMATOCRIT: 41.7 % (ref 39.0–52.0)
HEMOGLOBIN: 13.8 g/dL (ref 13.0–17.0)
LYMPHS ABS: 2.5 10*3/uL (ref 0.7–4.0)
Lymphocytes Relative: 43 %
MCH: 35.2 pg — AB (ref 26.0–34.0)
MCHC: 33.1 g/dL (ref 30.0–36.0)
MCV: 106.4 fL — ABNORMAL HIGH (ref 78.0–100.0)
MONO ABS: 0.6 10*3/uL (ref 0.1–1.0)
MONOS PCT: 10 %
NEUTROS ABS: 2.6 10*3/uL (ref 1.7–7.7)
Neutrophils Relative %: 44 %
Platelets: 233 10*3/uL (ref 150–400)
RBC: 3.92 MIL/uL — ABNORMAL LOW (ref 4.22–5.81)
RDW: 14.9 % (ref 11.5–15.5)
WBC: 5.8 10*3/uL (ref 4.0–10.5)

## 2017-04-29 LAB — COMPREHENSIVE METABOLIC PANEL
ALBUMIN: 3.5 g/dL (ref 3.5–5.0)
ALK PHOS: 89 U/L (ref 38–126)
ALT: 21 U/L (ref 17–63)
ANION GAP: 12 (ref 5–15)
AST: 32 U/L (ref 15–41)
BUN: 7 mg/dL (ref 6–20)
CHLORIDE: 102 mmol/L (ref 101–111)
CO2: 28 mmol/L (ref 22–32)
CREATININE: 0.91 mg/dL (ref 0.61–1.24)
Calcium: 8 mg/dL — ABNORMAL LOW (ref 8.9–10.3)
GFR calc Af Amer: >60 mL/min (ref >60)
GFR calc non Af Amer: >60 mL/min (ref >60)
GLUCOSE: 87 mg/dL (ref 65–99)
Potassium: 3.5 mmol/L (ref 3.5–5.1)
SODIUM: 142 mmol/L (ref 135–145)
Total Bilirubin: 0.3 mg/dL (ref 0.3–1.2)
Total Protein: 7 g/dL (ref 6.5–8.1)

## 2017-04-29 SURGERY — COLONOSCOPY WITH PROPOFOL
Anesthesia: Monitor Anesthesia Care

## 2017-04-29 MED ORDER — LACTATED RINGERS IV SOLN
INTRAVENOUS | Status: DC
  Administered 2017-04-29: 10:00:00 via INTRAVENOUS

## 2017-04-29 MED ORDER — PROPOFOL 10 MG/ML IV BOLUS
INTRAVENOUS | Status: AC
  Filled 2017-04-29: qty 20

## 2017-04-29 MED ORDER — CHLORHEXIDINE GLUCONATE CLOTH 2 % EX PADS: 6.0000 | MEDICATED_PAD | Freq: Once | CUTANEOUS | Status: DC

## 2017-04-29 MED ORDER — MIDAZOLAM HCL 2 MG/2ML IJ SOLN
1.0000 mg | INTRAMUSCULAR | Status: AC
  Administered 2017-04-29: 2 mg via INTRAVENOUS

## 2017-04-29 MED ORDER — FENTANYL CITRATE (PF) 100 MCG/2ML IJ SOLN
25.0000 ug | Freq: Once | INTRAMUSCULAR | Status: AC
  Administered 2017-04-29: 25 ug via INTRAVENOUS

## 2017-04-29 MED ORDER — MIDAZOLAM HCL 2 MG/2ML IJ SOLN
INTRAMUSCULAR | Status: AC
  Filled 2017-04-29: qty 2

## 2017-04-29 MED ORDER — FENTANYL CITRATE (PF) 100 MCG/2ML IJ SOLN
INTRAMUSCULAR | Status: AC
  Filled 2017-04-29: qty 2

## 2017-04-29 MED ORDER — SODIUM CHLORIDE 0.9% FLUSH
10.0000 mL | INTRAVENOUS | Status: DC | PRN
  Administered 2017-04-29: 10 mL via INTRAVENOUS
  Filled 2017-04-29: qty 10

## 2017-04-29 MED ORDER — PROPOFOL 500 MG/50ML IV EMUL
INTRAVENOUS | Status: DC | PRN
Start: 2017-04-29 — End: 2017-04-29
  Administered 2017-04-29: 200 ug/kg/min via INTRAVENOUS

## 2017-04-29 MED ORDER — LIDOCAINE VISCOUS 2 % MT SOLN
15.0000 mL | Freq: Once | OROMUCOSAL | Status: AC
  Administered 2017-04-29: 15 mL via OROMUCOSAL

## 2017-04-29 MED ORDER — LIDOCAINE VISCOUS 2 % MT SOLN
OROMUCOSAL | Status: AC
  Filled 2017-04-29: qty 15

## 2017-04-29 MED ORDER — PROPOFOL 10 MG/ML IV BOLUS
INTRAVENOUS | Status: DC | PRN
  Administered 2017-04-29: 50 mg via INTRAVENOUS

## 2017-04-29 MED ORDER — HEPARIN SOD (PORK) LOCK FLUSH 100 UNIT/ML IV SOLN
500.0000 [IU] | Freq: Once | INTRAVENOUS | Status: AC
  Administered 2017-04-29: 500 [IU] via INTRAVENOUS

## 2017-04-29 NOTE — Patient Instructions (Signed)
[place] at [facility] Discharge Instructions  You had your port flushed today and labs drawn   Thank you for choosing [place] at [facility] to provide your oncology and hematology care.  To afford each patient quality time with our provider, please arrive at least 15 minutes before your scheduled appointment time.   If you have a lab appointment with the [place] please come in thru the  Main Entrance and check in at the main information desk  You need to re-schedule your appointment should you arrive 10 or more minutes late.  We strive to give you quality time with our providers, and arriving late affects you and other patients whose appointments are after yours.  Also, if you no show three or more times for appointments you may be dismissed from the clinic at the providers discretion.     Again, thank you for choosing [facility].  Our hope is that these requests will decrease the amount of time that you wait before being seen by our physicians.       _____________________________________________________________  Should you have questions after your visit to [facility], please contact our office at [phone] between the hours of 8:30 a.m. and 4:30 p.m.  Voicemails left after 4:30 p.m. will not be returned until the following business day.  For prescription refill requests, have your pharmacy contact our office.       Resources For Cancer Patients and their Caregivers ? American Cancer Society: Can assist with transportation, wigs, general needs, runs Look Good Feel Better.        267 264 1323 ? Cancer Care: Provides financial assistance, online support groups, medication/co-pay assistance.  1-800-813-HOPE (364) 800-1162) ? [place] Assists [place] cancer patients and their families through emotional , educational and financial support.  (403)175-4883 ? [place]  DSS Where to apply for food stamps, Medicaid and utility assistance. (586) 450-1162 ? [employer]: Transportation to medical appointments. (504)642-3680 ? Social Security Administration: May apply for disability if have a Stage IV cancer. (915) 833-9060 (843) 655-2881 ? [employer], Disability and Transit Services: Assists with nutrition, care and transit needs. [place] Support Programs:   > Cancer Support Group  2nd Tuesday of the month 1pm-2pm, Journey Room   > Creative Journey  3rd Tuesday of the month 1130am-1pm, Journey Room

## 2017-04-29 NOTE — Progress Notes (Signed)
[name] presented for Portacath access and flush. Portacath located right chest wall accessed with  H 20 needle. Good blood return present. Portacath flushed with 75ml NS and 500U/57ml Heparin and needle removed intact. Procedure without incident. Patient tolerated procedure well. Patient reminded to pick up contrast from radiology and then go to day surgery front desk to pick up his discharge instructions.

## 2017-04-29 NOTE — Discharge Instructions (Signed)
Colon Polyps Polyps are tissue growths inside the body. Polyps can grow in many places, including the large intestine (colon). A polyp may be a round bump or a mushroom-shaped growth. You could have one polyp or several. Most colon polyps are noncancerous (benign). However, some colon polyps can become cancerous over time. What are the causes? The exact cause of colon polyps is not known. What increases the risk? This condition is more likely to develop in people who:  Have a family history of colon cancer or colon polyps.  Are older than 57 or older than 45 if they are African American.  Have inflammatory bowel disease, such as ulcerative colitis or Crohn disease.  Are overweight.  Smoke cigarettes.  Do not get enough exercise.  Drink too much alcohol.  Eat a diet that is: ? High in fat and red meat. ? Low in fiber.  Had childhood cancer that was treated with abdominal radiation.  What are the signs or symptoms? Most polyps do not cause symptoms. If you have symptoms, they may include:  Blood coming from your rectum when having a bowel movement.  Blood in your stool.The stool may look dark red or black.  A change in bowel habits, such as constipation or diarrhea.  How is this diagnosed? This condition is diagnosed with a colonoscopy. This is a procedure that uses a lighted, flexible scope to look at the inside of your colon. How is this treated? Treatment for this condition involves removing any polyps that are found. Those polyps will then be tested for cancer. If cancer is found, your health care provider will talk to you about options for colon cancer treatment. Follow these instructions at home: Diet  Eat plenty of fiber, such as fruits, vegetables, and whole grains.  Eat foods that are high in calcium and vitamin D, such as milk, cheese, yogurt, eggs, liver, fish, and broccoli.  Limit foods high in fat, red meats, and processed meats, such as hot dogs, sausage,  bacon, and lunch meats.  Maintain a healthy weight, or lose weight if recommended by your health care provider. General instructions  Do not smoke cigarettes.  Do not drink alcohol excessively.  Keep all follow-up visits as told by your health care provider. This is important. This includes keeping regularly scheduled colonoscopies. Talk to your health care provider about when you need a colonoscopy.  Exercise every day or as told by your health care provider. Contact a health care provider if:  You have new or worsening bleeding during a bowel movement.  You have new or increased blood in your stool.  You have a change in bowel habits.  You unexpectedly lose weight. This information is not intended to replace advice given to you by your health care provider. Make sure you discuss any questions you have with your health care provider. Document Released: 10/26/2003 Document Revised: 07/07/2015 Document Reviewed: 12/20/2014 Elsevier Interactive Patient Education  [employer]. Diverticulosis Diverticulosis is a condition that develops when small pouches (diverticula) form in the wall of the large intestine (colon). The colon is where water is absorbed and stool is formed. The pouches form when the inside layer of the colon pushes through weak spots in the outer layers of the colon. You may have a few pouches or many of them. What are the causes? The cause of this condition is not known. What increases the risk? The following factors may make you more likely to develop this condition:  Being older than age  49. Your risk for this condition increases with age. Diverticulosis is rare among people younger than age 73. By age 76, many people have it.  Eating a low-fiber diet.  Having frequent constipation.  Being overweight.  Not getting enough exercise.  Smoking.  Taking over-the-counter pain medicines, like aspirin and ibuprofen.  Having a family history of  diverticulosis.  What are the signs or symptoms? In most people, there are no symptoms of this condition. If you do have symptoms, they may include:  Bloating.  Cramps in the abdomen.  Constipation or diarrhea.  Pain in the lower left side of the abdomen.  How is this diagnosed? This condition is most often diagnosed during an exam for other colon problems. Because diverticulosis usually has no symptoms, it often cannot be diagnosed independently. This condition may be diagnosed by:  Using a flexible scope to examine the colon (colonoscopy).  Taking an X-ray of the colon after dye has been put into the colon (barium enema).  Doing a CT scan.  How is this treated? You may not need treatment for this condition if you have never developed an infection related to diverticulosis. If you have had an infection before, treatment may include:  Eating a high-fiber diet. This may include eating more fruits, vegetables, and grains.  Taking a fiber supplement.  Taking a live bacteria supplement (probiotic).  Taking medicine to relax your colon.  Taking antibiotic medicines.  Follow these instructions at home:  Drink 6-8 glasses of water or more each day to prevent constipation.  Try not to strain when you have a bowel movement.  If you have had an infection before: ? Eat more fiber as directed by your health care provider or your diet and nutrition specialist (dietitian). ? Take a fiber supplement or probiotic, if your health care provider approves.  Take over-the-counter and prescription medicines only as told by your health care provider.  If you were prescribed an antibiotic, take it as told by your health care provider. Do not stop taking the antibiotic even if you start to feel better.  Keep all follow-up visits as told by your health care provider. This is important. Contact a health care provider if:  You have pain in your abdomen.  You have bloating.  You have  cramps.  You have not had a bowel movement in 3 days. Get help right away if:  Your pain gets worse.  Your bloating becomes very bad.  You have a fever or chills, and your symptoms suddenly get worse.  You vomit.  You have bowel movements that are bloody or black.  You have bleeding from your rectum. Summary  Diverticulosis is a condition that develops when small pouches (diverticula) form in the wall of the large intestine (colon).  You may have a few pouches or many of them.  This condition is most often diagnosed during an exam for other colon problems.  If you have had an infection related to diverticulosis, treatment may include increasing the fiber in your diet, taking supplements, or taking medicines. This information is not intended to replace advice given to you by your health care provider. Make sure you discuss any questions you have with your health care provider. Document Released: 10/27/2003 Document Revised: 12/19/2015 Document Reviewed: 12/19/2015 Elsevier Interactive Patient Education  2017 [place].  Colonoscopy Discharge Instructions  Read the instructions outlined below and refer to this sheet in the next few weeks. These discharge instructions provide you with general information on caring  for yourself after you leave the hospital. Your doctor may also give you specific instructions. While your treatment has been planned according to the most current medical practices available, unavoidable complications occasionally occur. If you have any problems or questions after discharge, call Dr. Gala Romney at [phone]. ACTIVITY  You may resume your regular activity, but move at a slower pace for the next 24 hours.   Take frequent rest periods for the next 24 hours.   Walking will help get rid of the air and reduce the bloated feeling in your belly (abdomen).   No driving for 24 hours (because of the medicine (anesthesia) used during the test).    Do not sign any  important legal documents or operate any machinery for 24 hours (because of the anesthesia used during the test).  NUTRITION  Drink plenty of fluids.   You may resume your normal diet as instructed by your doctor.   Begin with a light meal and progress to your normal diet. Heavy or fried foods are harder to digest and may make you feel sick to your stomach (nauseated).   Avoid alcoholic beverages for 24 hours or as instructed.  MEDICATIONS  You may resume your normal medications unless your doctor tells you otherwise.  WHAT YOU CAN EXPECT TODAY  Some feelings of bloating in the abdomen.   Passage of more gas than usual.   Spotting of blood in your stool or on the toilet paper.  IF YOU HAD POLYPS REMOVED DURING THE COLONOSCOPY:  No aspirin products for 7 days or as instructed.   No alcohol for 7 days or as instructed.   Eat a soft diet for the next 24 hours.  FINDING OUT THE RESULTS OF YOUR TEST Not all test results are available during your visit. If your test results are not back during the visit, make an appointment with your caregiver to find out the results. Do not assume everything is normal if you have not heard from your caregiver or the medical facility. It is important for you to follow up on all of your test results.  SEEK IMMEDIATE MEDICAL ATTENTION IF:  You have more than a spotting of blood in your stool.   Your belly is swollen (abdominal distention).   You are nauseated or vomiting.   You have a temperature over 101.   You have abdominal pain or discomfort that is severe or gets worse throughout the day.  EGD Discharge instructions Please read the instructions outlined below and refer to this sheet in the next few weeks. These discharge instructions provide you with general information on caring for yourself after you leave the hospital. Your doctor may also give you specific instructions. While your treatment has been planned according to the most current  medical practices available, unavoidable complications occasionally occur. If you have any problems or questions after discharge, please call your doctor. ACTIVITY  You may resume your regular activity but move at a slower pace for the next 24 hours.   Take frequent rest periods for the next 24 hours.   Walking will help expel (get rid of) the air and reduce the bloated feeling in your abdomen.   No driving for 24 hours (because of the anesthesia (medicine) used during the test).   You may shower.   Do not sign any important legal documents or operate any machinery for 24 hours (because of the anesthesia used during the test).  NUTRITION  Drink plenty of fluids.   You may  resume your normal diet.   Begin with a light meal and progress to your normal diet.   Avoid alcoholic beverages for 24 hours or as instructed by your caregiver.  MEDICATIONS  You may resume your normal medications unless your caregiver tells you otherwise.  WHAT YOU CAN EXPECT TODAY  You may experience abdominal discomfort such as a feeling of fullness or gas pains.  FOLLOW-UP  Your doctor will discuss the results of your test with you.  SEEK IMMEDIATE MEDICAL ATTENTION IF ANY OF THE FOLLOWING OCCUR:  Excessive nausea (feeling sick to your stomach) and/or vomiting.   Severe abdominal pain and distention (swelling).   Trouble swallowing.   Temperature over 101 F (37.8 C).   Rectal bleeding or vomiting of blood.    Continue Nexium 20 mg twice daily  Diverticulosis and colon polyp information provided  Further recommendations to follow pending review of pathology report  Office visit with [place] in 6 weeks

## 2017-04-29 NOTE — Op Note (Signed)
[facility] Patient Name: [name] Procedure Date: 04/29/2017 10:18 AM MRN: [id number] Date of Birth: [birth year] Attending MD: Norvel Richards , MD CSN: [id number] Age: 57 Admit Type: Outpatient Procedure:                Upper GI endoscopy Indications:              Dysphagia Providers:                Norvel Richards, MD, Janeece Riggers, RN, Aram Candela Referring MD:             Stoney Bang MD, MD, Robynn Pane MD, MD Medicines:                Propofol per Anesthesia Complications:            No immediate complications. Estimated Blood Loss:     Estimated blood loss was minimal. Procedure:                Pre-Anesthesia Assessment:                           - Prior to the procedure, a History and Physical                            was performed, and patient medications and                            allergies were reviewed. The patient's tolerance of                            previous anesthesia was also reviewed. The risks                            and benefits of the procedure and the sedation                            options and risks were discussed with the patient.                            All questions were answered, and informed consent                            was obtained. Prior Anticoagulants: The patient has                            taken no previous anticoagulant or antiplatelet                            agents. ASA Grade Assessment: III - A patient with                            severe systemic disease. After reviewing the risks  and benefits, the patient was deemed in                            satisfactory condition to undergo the procedure.                           After obtaining informed consent, the endoscope was                            passed under direct vision. Throughout the                            procedure, the patient's blood pressure, pulse, and       oxygen saturations were monitored continuously. The                            [id number] scope was introduced through the                            and advanced to the second part of duodenum. The                            upper GI endoscopy was accomplished without                            difficulty. The patient tolerated the procedure                            well. Scope In: 10:35:33 AM Scope Out: 10:43:11 AM Total Procedure Duration: 0 hours 7 minutes 38 seconds  Findings:      Diffuse mucosal changes were found in the esophagus. mucosa had a       "ringed" appearance. Crepe' paper consistency. Some minimal resistance       passing the scope through the esophageal body. No tumor. No peptic       stricture or refuxx esophagitis seen. .      Changes consistent with Portal hypertensive gastropathy was found in the       stomach. gastric erosions present. No ulcer, varices or infiltrating       process seen. Pylorus patent. Duodenal erosions present.. Estimated       blood loss was minimal. The scope was withdrawn. Dilation was performed       with a Maloney dilator with mild resistance at 50 Fr. The dilation site       was examined following endoscope reinsertion and showed mild improvement       in luminal narrowing. Estimated blood loss: none This was biopsied with       a cold forceps for histology. gastric mucosa needed in distal esophagus       also biopsied. Estimated blood loss was minimal. Impression:               - Mucosal changes in the esophagus suspicious for                            EOB. Dilated. Biopsied.                           -  Portal hypertensive gastropathy. Biopsied. Moderate Sedation:      Moderate (conscious) sedation was personally administered by an       anesthesia professional. The following parameters were monitored: oxygen       saturation, heart rate, blood pressure, respiratory rate, EKG, adequacy       of pulmonary ventilation, and  response to care. Total physician       intraservice time was 13 minutes. Recommendation:           - Patient has a contact number available for                            emergencies. The signs and symptoms of potential                            delayed complications were discussed with the                            patient. Return to normal activities tomorrow.                            Written discharge instructions were provided to the                            patient.                           - Resume previous diet.                           - Continue present medications.                           - Await pathology results.                           - No repeat upper endoscopy. Continue Nexium 20 mg                            twice daily.                           - Return to GI office in 6 weeks. See colonoscopy                            report. Procedure Code(s):        --- Professional ---                           [phone], Esophagogastroduodenoscopy, flexible,                            transoral; with biopsy, single or multiple                           43450, Dilation of esophagus, by unguided sound or  bougie, single or multiple passes Diagnosis Code(s):        --- Professional ---                           K22.8, Other specified diseases of esophagus                           K76.6, Portal hypertension                           K31.89, Other diseases of stomach and duodenum                           R13.10, Dysphagia, unspecified CPT copyright 2016 American Medical Association. All rights reserved. The codes documented in this report are preliminary and upon coder review may  be revised to meet current compliance requirements. Cristopher Estimable. Keifer Habib, MD Norvel Richards, MD 04/29/2017 11:13:19 AM This report has been signed electronically. Number of Addenda: 0

## 2017-04-29 NOTE — H&P (Addendum)
@LOGO @   Primary Care Physician:  [name], MD Primary Gastroenterologist:  Dr. Gala Romney  Pre-Procedure History & Physical: HPI:  [name] is a 57 y.o. male here for further evaluation of abdominal pain esophageal dysphagia and first ever screening colonoscopy. His history stage IV Gist. History of large ventral hernia. History of alcohol abuse.  Past Medical History:  Diagnosis Date  . Alcohol abuse   . Anemia   . Anxiety 09/04/2012  . GERD (gastroesophageal reflux disease)   . GIST (gastrointestinal stroma tumor), malignant, colon ([place])   . Hx of bipolar disorder   . Malignant GIST ([place]) 09/23/2014   initially diagnosed in 2014  . Pancreatitis   . PE (physical exam), annual   . Pulmonary nodule 05/29/2016  . Tobacco abuse   . Ventral hernia     Past Surgical History:  Procedure Laterality Date  . HERNIA REPAIR    . PARTIAL COLECTOMY    . PORTACATH PLACEMENT Right 03/14/2015   Procedure: INSERTION PORT-A-CATH;  Surgeon: Aviva Signs, MD;  Location: [facility];  Service: General;  Laterality: Right;  . SMALL INTESTINE SURGERY     small bowel resection for obstruction    Prior to Admission medications   Medication Sig Start Date End Date Taking? Authorizing Provider  aspirin EC 325 MG tablet Take 650 mg by mouth daily as needed for moderate pain.   Yes [provider]  busPIRone (BUSPAR) 15 MG tablet Take 30 mg by mouth 2 (two) times daily. 01/22/17  Yes [provider]  imatinib (GLEEVEC) 400 MG tablet Take 1 tablet (400 mg total) by mouth daily. 10/05/16  Yes Twana First, MD  ketorolac (TORADOL) 10 MG tablet Take 10 mg by mouth 3 (three) times daily as needed for moderate pain.  03/11/17  Yes [provider]  mirtazapine (REMERON) 45 MG tablet Take 1 tablet (45 mg total) by mouth at bedtime as needed and may repeat dose one time if needed. Patient taking differently: Take 45 mg by mouth at bedtime.  08/02/15  Yes Kefalas, Manon Hilding, PA-C  NEXIUM  20 MG capsule Take 20 mg by mouth 2 (two) times daily. 05/15/16  Yes [provider]  potassium chloride SA (K-DUR,KLOR-CON) 20 MEQ tablet Take 3 tablets (60 mEq total) by mouth daily. Patient taking differently: Take 20 mEq by mouth 2 (two) times daily.  11/13/16  Yes Twana First, MD  traZODone (DESYREL) 50 MG tablet Take 50 mg by mouth at bedtime. 05/15/16  Yes [provider]  antiseptic oral rinse (BIOTENE) LIQD 15 mLs by Mouth Rinse route as needed for dry mouth.    [provider]  dicyclomine (BENTYL) 10 MG capsule Take 1 capsule (10 mg total) by mouth 4 (four) times daily -  before meals and at bedtime. Patient not taking: Reported on 04/15/2017 03/29/17   Annitta Needs, NP  lidocaine-prilocaine (EMLA) cream Apply 1 application topically as needed. Patient not taking: Reported on 04/15/2017 07/05/15   Patrici Ranks, MD  Misc. Devices MISC Please provide patient with a pill crusher. 11/13/16   Twana First, MD  Omega-3 Fatty Acids (OMEGA-3 FISH OIL) 300 MG CAPS Take 300 mg by mouth daily.    [provider]    Allergies as of 03/29/2017  . (No Known Allergies)    Family History  Problem Relation Age of Onset  . Cancer Mother   . Hypertension Father   . Cancer Brother   . Colon cancer Neg Hx  Social History   Socioeconomic History  . Marital status: Single    Spouse name: Not on file  . Number of children: Not on file  . Years of education: Not on file  . Highest education level: Not on file  Social Needs  . Financial resource strain: Not on file  . Food insecurity - worry: Not on file  . Food insecurity - inability: Not on file  . Transportation needs - medical: Not on file  . Transportation needs - non-medical: Not on file  Occupational History  . Not on file  Tobacco Use  . Smoking status: Current Every Day Smoker    Packs/day: 0.50    Years: 36.00    Pack years: 18.00    Types: Cigarettes  . Smokeless tobacco: Never Used  .  Tobacco comment: refused smoking cessation materials.   Substance and Sexual Activity  . Alcohol use: Yes    Alcohol/week: 14.4 oz    Types: 24 Cans of beer per week    Comment: 2 - 3 cans beer daily  . Drug use: Yes    Types: Cocaine    Comment: denied 03/29/17, none in 6-7 months. "occasional"   . Sexual activity: Not Currently  Other Topics Concern  . Not on file  Social History Narrative  . Not on file    Review of Systems: See HPI, otherwise negative ROS  Physical Exam: BP 138/87   Pulse 86   Temp 98 F (36.7 C) (Oral)   Resp 20   SpO2 95%  General:   Alert,  Well-developed, well-nourished, pleasant and cooperative in NAD Neck:  Supple; no masses or thyromegaly. No significant cervical adenopathy. Lungs:  Clear throughout to auscultation.   No wheezes, crackles, or rhonchi. No acute distress. Heart:  Regular rate and rhythm; no murmurs, clicks, rubs,  or gallops. Abdomen: Non-distended, normal bowel sounds.  Soft and nontender without appreciable mass or hepatosplenomegaly.  Pulses:  Normal pulses noted. Extremities:  Without clubbing or edema.  Impression: 57 year old male with history stage IV GIST presents with upper abdomoninal pain,  intermittent esophageal dysphagia. Large ventral hernia. No prior colonoscopy.  Recommendations:  I have offered the patient an EGD with ED and colonoscopy per plan.  The risks, benefits, limitations, imponderables and alternatives regarding both EGD and colonoscopy have been reviewed with the patient. Questions have been answered. All parties agreeable.    Notice: This dictation was prepared with Dragon dictation along with smaller phrase technology. Any transcriptional errors that result from this process are unintentional and may not be corrected upon review.

## 2017-04-29 NOTE — Anesthesia Postprocedure Evaluation (Signed)
Anesthesia Post Note  Patient: [name]  Procedure(s) Performed: COLONOSCOPY WITH PROPOFOL ([name] ) ESOPHAGOGASTRODUODENOSCOPY (EGD) WITH PROPOFOL ([name] ) MALONEY DILATION ([name] ) BIOPSY POLYPECTOMY  Patient location during evaluation: PACU Anesthesia Type: MAC Level of consciousness: awake and alert and oriented Pain management: pain level controlled Vital Signs Assessment: post-procedure vital signs reviewed and stable Respiratory status: spontaneous breathing, nonlabored ventilation, respiratory function stable and patient connected to nasal cannula oxygen Cardiovascular status: stable Postop Assessment: no apparent nausea or vomiting Anesthetic complications: no     Last Vitals:  Vitals:   04/29/17 1116 04/29/17 1119  BP: 121/85   Pulse: 88 82  Resp: 14 12  Temp:    SpO2: 91% 94%    Last Pain:  Vitals:   04/29/17 1119  TempSrc:   PainSc: 0-No pain                 Nickoli Bagheri

## 2017-04-29 NOTE — Transfer of Care (Signed)
Immediate Anesthesia Transfer of Care Note  Patient: [name]  Procedure(s) Performed: COLONOSCOPY WITH PROPOFOL ([name] ) ESOPHAGOGASTRODUODENOSCOPY (EGD) WITH PROPOFOL ([name] ) MALONEY DILATION ([name] ) BIOPSY POLYPECTOMY  Patient Location: PACU  Anesthesia Type:MAC  Level of Consciousness: awake, alert  and oriented  Airway & Oxygen Therapy: Patient Spontanous Breathing and Patient connected to nasal cannula oxygen  Post-op Assessment: Report given to RN and Post -op Vital signs reviewed and stable  Post vital signs: Reviewed and stable  Last Vitals:  Vitals:   04/29/17 1020 04/29/17 1025  BP: 123/79 110/74  Pulse:    Resp: 16 15  Temp:    SpO2: 97% 96%    Last Pain:  Vitals:   04/29/17 0907  TempSrc: Oral  PainSc: 8          Complications: No apparent anesthesia complications

## 2017-04-29 NOTE — Anesthesia Preprocedure Evaluation (Signed)
Anesthesia Evaluation  Patient identified by MRN, date of birth, ID band Patient awake    Reviewed: Allergy & Precautions, NPO status , Patient's Chart, lab work & pertinent test results  Airway Mallampati: I  TM Distance: >3 FB     Dental  (+) Edentulous Upper, Edentulous Lower   Pulmonary COPD, Current Smoker,           Cardiovascular negative cardio ROS   Rhythm:Regular Rate:Normal     Neuro/Psych PSYCHIATRIC DISORDERS Anxiety Bipolar Disorder    GI/Hepatic GERD  ,GIST tumor    Endo/Other    Renal/GU      Musculoskeletal   Abdominal   Peds  Hematology   Anesthesia Other Findings ETOH abuse Cocaine hx  Reproductive/Obstetrics                             Anesthesia Physical Anesthesia Plan  ASA: III  Anesthesia Plan: MAC   Post-op Pain Management:    Induction: Intravenous  PONV Risk Score and Plan:   Airway Management Planned: Simple Face Mask  Additional Equipment:   Intra-op Plan:   Post-operative Plan:   Informed Consent: I have reviewed the patients History and Physical, chart, labs and discussed the procedure including the risks, benefits and alternatives for the proposed anesthesia with the patient or authorized representative who has indicated his/her understanding and acceptance.     Plan Discussed with:   Anesthesia Plan Comments:         Anesthesia Quick Evaluation

## 2017-04-30 NOTE — Op Note (Signed)
[facility] Patient Name: [name] Procedure Date: 04/29/2017 10:45 AM MRN: [id number] Date of Birth: 13-Jan-1961 Attending MD: Norvel Richards , MD CSN: [id number] Age: 57 Admit Type: Outpatient Procedure:                Colonoscopy Indications:              Screening for colorectal malignant neoplasm Providers:                Norvel Richards, MD, Janeece Riggers, RN, Aram Candela Referring MD:             Stoney Bang MD, MD, Robynn Pane MD, MD Medicines:                Propofol per Anesthesia Complications:            No immediate complications. Estimated Blood Loss:     Estimated blood loss was minimal. Procedure:                Pre-Anesthesia Assessment:                           - Prior to the procedure, a History and Physical                            was performed, and patient medications and                            allergies were reviewed. The patient's tolerance of                            previous anesthesia was also reviewed. The risks                            and benefits of the procedure and the sedation                            options and risks were discussed with the patient.                            All questions were answered, and informed consent                            was obtained. Prior Anticoagulants: The patient has                            taken no previous anticoagulant or antiplatelet                            agents. ASA Grade Assessment: III - A patient with                            severe systemic disease. After reviewing the risks  and benefits, the patient was deemed in                            satisfactory condition to undergo the procedure.                           After obtaining informed consent, the colonoscope                            was passed under direct vision. Throughout the                            procedure, the patient's blood pressure,  pulse, and                            oxygen saturations were monitored continuously. The                            EC-[id number] ([id number]) scope was introduced through                            the and advanced to the the cecum, identified by                            appendiceal orifice and ileocecal valve. The                            colonoscopy was performed without difficulty. The                            patient tolerated the procedure well. The quality                            of the bowel preparation was adequate. The entire                            colon was well visualized. The ileocecal valve,                            appendiceal orifice, and rectum were photographed. Scope In: 10:47:41 AM Scope Out: 11:00:43 AM Scope Withdrawal Time: 0 hours 7 minutes 30 seconds  Total Procedure Duration: 0 hours 13 minutes 2 seconds  Findings:      The perianal and digital rectal examinations were normal.      Scattered small-mouthed diverticula were found in the entire colon.       surgical anastomosis identified at 25 cm from anal verge.      A 5 mm polyp was found in the hepatic flexure. The polyp was sessile.       The polyp was removed with a cold snare. Resection and retrieval were       complete. Estimated blood loss was minimal.      The exam was otherwise without abnormality on direct and retroflexion       views. Impression:               -  Diverticulosis in the entire examined colon.                           - One 5 mm polyp at the hepatic flexure, removed                            with a cold snare. Resected and retrieved.                           - The examination was otherwise normal on direct                            and retroflexion views. Moderate Sedation:      Moderate (conscious) sedation was personally administered by an       anesthesia professional. The following parameters were monitored: oxygen       saturation, heart rate, blood pressure, and  response to care. Total       physician intraservice time was 20 minutes. Recommendation:           - Patient has a contact number available for                            emergencies. The signs and symptoms of potential                            delayed complications were discussed with the                            patient. Return to normal activities tomorrow.                            Written discharge instructions were provided to the                            patient.                           - Resume previous diet.                           - Continue present medications.                           - Repeat colonoscopy date to be determined after                            pending pathology results are reviewed for                            surveillance.                           - Return to GI office (date not yet determined). Procedure Code(s):        --- Professional ---  45385, Colonoscopy, flexible; with removal of                            tumor(s), polyp(s), or other lesion(s) by snare                            technique Diagnosis Code(s):        --- Professional ---                           Z12.11, Encounter for screening for malignant                            neoplasm of colon                           D12.3, Benign neoplasm of transverse colon (hepatic                            flexure or splenic flexure)                           K57.30, Diverticulosis of large intestine without                            perforation or abscess without bleeding CPT copyright 2016 [employer]. All rights reserved. The codes documented in this report are preliminary and upon coder review may  be revised to meet current compliance requirements. Cristopher Estimable. Termaine Roupp, MD Norvel Richards, MD 04/30/2017 8:11:59 AM This report has been signed electronically. Number of Addenda: 0

## 2017-04-30 NOTE — Addendum Note (Signed)
Addendum  created 04/30/17 0810 by Vista Deck, CRNA   Charge Capture section accepted

## 2017-05-02 ENCOUNTER — Encounter: Payer: Self-pay | Admitting: Internal Medicine

## 2017-05-03 ENCOUNTER — Other Ambulatory Visit (HOSPITAL_COMMUNITY): Payer: Medicaid Other

## 2017-05-03 ENCOUNTER — Encounter (HOSPITAL_COMMUNITY): Payer: Self-pay | Admitting: Internal Medicine

## 2017-05-06 ENCOUNTER — Encounter: Payer: Self-pay | Admitting: Internal Medicine

## 2017-05-13 ENCOUNTER — Other Ambulatory Visit (HOSPITAL_COMMUNITY): Payer: Self-pay | Admitting: Emergency Medicine

## 2017-05-13 DIAGNOSIS — C49A9 Gastrointestinal stromal tumor of other sites: Secondary | ICD-10-CM

## 2017-05-13 MED ORDER — IMATINIB MESYLATE 400 MG PO TABS: 400.0000 mg | ORAL_TABLET | Freq: Every day | ORAL | 0 refills | Status: DC

## 2017-05-13 NOTE — Progress Notes (Unsigned)
gleevec refilled

## 2017-05-14 ENCOUNTER — Other Ambulatory Visit (HOSPITAL_COMMUNITY): Payer: Self-pay | Admitting: Adult Health

## 2017-05-14 ENCOUNTER — Other Ambulatory Visit (HOSPITAL_COMMUNITY): Payer: Self-pay

## 2017-05-14 DIAGNOSIS — E876 Hypokalemia: Secondary | ICD-10-CM

## 2017-05-14 MED ORDER — POTASSIUM CHLORIDE CRYS ER 20 MEQ PO TBCR: 60.0000 meq | EXTENDED_RELEASE_TABLET | Freq: Every day | ORAL | 2 refills | Status: AC

## 2017-05-14 NOTE — Telephone Encounter (Signed)
Received refill request from patient for potassium. Reviewed with provider, chart checked and refilled.

## 2017-06-03 ENCOUNTER — Ambulatory Visit (HOSPITAL_COMMUNITY): Payer: Medicaid Other

## 2017-06-06 ENCOUNTER — Ambulatory Visit (HOSPITAL_COMMUNITY): Payer: Medicaid Other | Admitting: Internal Medicine

## 2017-06-18 ENCOUNTER — Telehealth (HOSPITAL_COMMUNITY): Payer: Self-pay | Admitting: Emergency Medicine

## 2017-06-18 ENCOUNTER — Other Ambulatory Visit (HOSPITAL_COMMUNITY): Payer: Self-pay | Admitting: Emergency Medicine

## 2017-06-18 DIAGNOSIS — C49A9 Gastrointestinal stromal tumor of other sites: Secondary | ICD-10-CM

## 2017-06-18 MED ORDER — IMATINIB MESYLATE 400 MG PO TABS: 400.0000 mg | ORAL_TABLET | Freq: Every day | ORAL | 0 refills | Status: DC

## 2017-06-18 NOTE — Progress Notes (Signed)
Gleevec refilled . 

## 2017-06-18 NOTE — Telephone Encounter (Signed)
Cancelled refill on Gleevec.  Pt is transferring care to [place] center.

## 2017-07-10 ENCOUNTER — Ambulatory Visit: Payer: Medicaid Other | Admitting: Gastroenterology

## 2018-01-07 ENCOUNTER — Encounter (HOSPITAL_COMMUNITY): Payer: Self-pay | Admitting: Internal Medicine

## 2018-01-07 ENCOUNTER — Inpatient Hospital Stay (HOSPITAL_COMMUNITY): Payer: Medicaid Other | Attending: Internal Medicine | Admitting: Internal Medicine

## 2018-01-07 ENCOUNTER — Inpatient Hospital Stay (HOSPITAL_COMMUNITY): Payer: Medicaid Other

## 2018-01-07 VITALS — BP 106/66 | HR 106 | Temp 97.9°F | Resp 18 | Ht 73.0 in | Wt 187.0 lb

## 2018-01-07 DIAGNOSIS — G8929 Other chronic pain: Secondary | ICD-10-CM | POA: Diagnosis not present

## 2018-01-07 DIAGNOSIS — F1410 Cocaine abuse, uncomplicated: Secondary | ICD-10-CM | POA: Insufficient documentation

## 2018-01-07 DIAGNOSIS — E876 Hypokalemia: Secondary | ICD-10-CM | POA: Insufficient documentation

## 2018-01-07 DIAGNOSIS — R109 Unspecified abdominal pain: Secondary | ICD-10-CM | POA: Diagnosis not present

## 2018-01-07 DIAGNOSIS — J449 Chronic obstructive pulmonary disease, unspecified: Secondary | ICD-10-CM | POA: Insufficient documentation

## 2018-01-07 DIAGNOSIS — F319 Bipolar disorder, unspecified: Secondary | ICD-10-CM | POA: Insufficient documentation

## 2018-01-07 DIAGNOSIS — Z9114 Patient's other noncompliance with medication regimen: Secondary | ICD-10-CM | POA: Diagnosis not present

## 2018-01-07 DIAGNOSIS — F17210 Nicotine dependence, cigarettes, uncomplicated: Secondary | ICD-10-CM | POA: Diagnosis not present

## 2018-01-07 DIAGNOSIS — Z86711 Personal history of pulmonary embolism: Secondary | ICD-10-CM | POA: Diagnosis not present

## 2018-01-07 DIAGNOSIS — F1010 Alcohol abuse, uncomplicated: Secondary | ICD-10-CM | POA: Insufficient documentation

## 2018-01-07 DIAGNOSIS — C49A3 Gastrointestinal stromal tumor of small intestine: Secondary | ICD-10-CM

## 2018-01-07 DIAGNOSIS — Z79899 Other long term (current) drug therapy: Secondary | ICD-10-CM | POA: Diagnosis not present

## 2018-01-07 DIAGNOSIS — K219 Gastro-esophageal reflux disease without esophagitis: Secondary | ICD-10-CM

## 2018-01-07 DIAGNOSIS — Z86010 Personal history of colonic polyps: Secondary | ICD-10-CM | POA: Insufficient documentation

## 2018-01-07 LAB — CBC WITH DIFFERENTIAL/PLATELET
ABS IMMATURE GRANULOCYTES: 0.09 10*3/uL — AB (ref 0.00–0.07)
Basophils Absolute: 0 10*3/uL (ref 0.0–0.1)
Basophils Relative: 1 %
EOS PCT: 1 %
Eosinophils Absolute: 0.1 10*3/uL (ref 0.0–0.5)
HEMATOCRIT: 32.2 % — AB (ref 39.0–52.0)
HEMOGLOBIN: 10.7 g/dL — AB (ref 13.0–17.0)
Immature Granulocytes: 1 %
LYMPHS ABS: 1.7 10*3/uL (ref 0.7–4.0)
LYMPHS PCT: 22 %
MCH: 34 pg (ref 26.0–34.0)
MCHC: 33.2 g/dL (ref 30.0–36.0)
MCV: 102.2 fL — ABNORMAL HIGH (ref 80.0–100.0)
MONO ABS: 0.9 10*3/uL (ref 0.1–1.0)
MONOS PCT: 11 %
Neutro Abs: 4.8 10*3/uL (ref 1.7–7.7)
Neutrophils Relative %: 64 %
Platelets: 342 10*3/uL (ref 150–400)
RBC: 3.15 MIL/uL — ABNORMAL LOW (ref 4.22–5.81)
RDW: 14.1 % (ref 11.5–15.5)
WBC: 7.5 10*3/uL (ref 4.0–10.5)
nRBC: 0 % (ref 0.0–0.2)

## 2018-01-07 LAB — COMPREHENSIVE METABOLIC PANEL
ALT: 18 U/L (ref 0–44)
ANION GAP: 11 (ref 5–15)
AST: 31 U/L (ref 15–41)
Albumin: 3.1 g/dL — ABNORMAL LOW (ref 3.5–5.0)
Alkaline Phosphatase: 74 U/L (ref 38–126)
BUN: 7 mg/dL (ref 6–20)
CHLORIDE: 104 mmol/L (ref 98–111)
CO2: 22 mmol/L (ref 22–32)
Calcium: 7.4 mg/dL — ABNORMAL LOW (ref 8.9–10.3)
Creatinine, Ser: 0.91 mg/dL (ref 0.61–1.24)
GFR calc Af Amer: >60 mL/min (ref >60)
GFR calc non Af Amer: >60 mL/min (ref >60)
Glucose, Bld: 119 mg/dL — ABNORMAL HIGH (ref 70–99)
POTASSIUM: 3.3 mmol/L — AB (ref 3.5–5.1)
Sodium: 137 mmol/L (ref 135–145)
Total Bilirubin: 0.2 mg/dL — ABNORMAL LOW (ref 0.3–1.2)
Total Protein: 6.7 g/dL (ref 6.5–8.1)

## 2018-01-07 LAB — LACTATE DEHYDROGENASE: LDH: 182 U/L (ref 98–192)

## 2018-01-07 NOTE — Progress Notes (Signed)
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  Assessment and Plan:  1.  Stage IV GIST of small intestine:.  Pt was previously followed by Dr. Talbert Cage.  Pt was diagnosed in 2014. Treated with partial small bowel resection on 09/04/12, but with recurrence of disease with extensive peritoneal disease.  Started on adjuvant Gleevec on 04/13/13 with positive response to therapy. He was followed at [facility] and [facility] (now [facility] in the past, but continuing his management there was complicated by his polysubstance abuse with alcohol and cocaine.  Urine drug screen in 11/2014 positive for cocaine and negative for opiates, which lead to discontinuation of pt receiving controlled substance prescriptions from our cancer center at that time.  Documented history of non-compliance with multiple missed follow-up appointments. -Last scan at [facility] was CT chest/abd/pelvis on 11/14/16 revealed stable surgical changes with no evidence of recurrent or metastatic disease; stable mediastinal and right hilar lymph nodes; severe emphysematous changes but no findings for pulmonary metastatic disease; anterior abd wall hernia without complicating features.  -He was recommended to continue Gleevec 400 mg daily. Reports that he is taking medication daily as prescribed. He was recommended for restaging scans in 05/2017.    Pt did not follow-up and has not been seen at [facility] since 02/01/2017.  He has been followed at [facility].  Review of chart shows he was last seen there 11/26/2017 with their assessment and plan as follows: "ASSESSMENT 57 y.o. male with medical history notable for colon polyps, bipolar 1 disorder, polysubstance abuse,  pancreatitis, GERD, chronic back pain, pulmonary embolism with anticoagulation, blindness. Patient's oncologic history is notable for a gastrointestinal stromal tumor involving multiple areas of the peritoneum which has been treated with chronic Gleevec therapy  RECOMMENDATION/PLAN  Gastrointestinal stromal tumor: Unresectable multifocal disease involving the peritoneal cavity and small bowel. Has been managed successfully with Gleevec therapy. Disease waxes and wanes as a function of his compliance. CT CAP on Jul 03 2017 showed no active disease indicating that he was likely compliant with Gleevec. CT abdomen/pelvis on November 22 2017 shows progression in setting of documented medication noncompliance. Patient to restart Gleevec 400 mg daily. We will call [employer] to ask that a 1 month supply be shipped and patient to contact [employer] to make shipping arrangements. Abdominal U/S in 1 month. If no improvement then will consider increasing Gleevec dose or change to Sunitinib. Explained to the patient that the latter has a significant side effect profile compared to [place].   Polysubstance abuse: Documented in numerous reports by numerous practices. Includes ongoing alcohol abuse, nicotine abuse, history of cocaine abuse and has also posed numerous requests for narcotic analgesics but curiously multiple urine drug screens have been negative for the presence of narcotics. This, particularly EtOH abuse is interfering with medical compliance. Encouraged patient to seek help as it is negatively impacting his prognosis.   Chronic abdominal pain: May be secondary to multiple bowel surgeries. Chronic pancreatitis is also in the differential We will not write for narcotic analgesics given history of substance abuse and ongoing alcohol abuse and I will not refer patient to a pain clinic. This will be the [place] of his PCP.   Poor venous access: Etiology is unclear. Patient has portacath in place"  Pt  reports he was  aware of CT showing progession in 11/2017.  Unclear why pt did not follow-up with [facility].  Labs done 01/07/2018 reviewed and showed WBC 7.5 Hb 10.7 plts 342,000.  Chemistries WNL with Cr 0.91 normal LFTs.  Pt is set up for CT CAP for follow-up.  He will follow-up with Dr. Redge Gainer to go over results.  Progression was suspected to be due to noncompliance with medication per [facility].  Compliance with Gleevec recommended.    2.  Polysubstance abuse.  Pt has history of polysubstance abuse (namely alcohol and cocaine), which was suspected to affect his compliance with Gleevec therapy. [facility] was also aware of pt history.  He is referred to pain clinic for narcotic management based on history.    3.  Abdominal pain. Felt to be chronic.  Pt set up for CT CAP for evaluation.  Office was not prescribing narcotics for pt based on history.    4.  Hypokalemia.  Potassium slightly decreased at 3.3.  Pt on Po potassium.  Continue as recommended.    5  Request for handicapped sticker.  Form filled out today.    25 minutes spent with more than 50% spent in counseling and coordination of care.    Interval History:  Historical data obtained from note dated 02/01/2017.  .  Stage IV GIST of small intestine:.  Pt was previously followed by Dr. Talbert Cage.  Pt was diagnosed in 2014. Treated with partial small bowel resection on 09/04/12, but with recurrence of disease with extensive peritoneal disease.  Started on adjuvant Gleevec on 04/13/13 with positive response to therapy. He was followed at [facility] and [facility] in the past, but continuing his management there was complicated by his polysubstance abuse with alcohol and cocaine.  Urine drug screen in 11/2014 positive for cocaine and negative for opiates, which lead to discontinuation of pt receiving controlled substance prescriptions from our cancer center at that time.  Documented history of non-compliance with  multiple missed follow-up appointments.  Current Status:  Pt seen today after not being seen since 2018.  He has been followed by [facility]  He reports he had scan done in ER that showed "progression."  He reports compliance with Gleevec.  He is requesting handicap form to be completed.     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    02/10/2015 Imaging    CT CAP-  Mildly reduced size of left abdominal tumor implants. No new tumor implants identified.    03/14/2015 Procedure    Port-a-cath placed by Dr. Arnoldo Morale    03/14/2016 Imaging    CT CAP- 1. Continued reduction in size of left abdominal peritoneal metastases. No new or progressive metastatic disease in the abdomen or pelvis. 2. Solitary new irregular 1.0 cm nodular opacity in the anterior right upper lobe, highly nonspecific, potentially inflammatory, however cannot exclude pulmonary malignancy (metastatic or primary lung carcinoma). New nonspecific mild subcarinal and right hilar lymphadenopathy, cannot exclude new nodal metastases. PET-CT could be useful for further characterization / biopsy planning, as clinically warranted. Alternatively, a follow-up chest CT with IV contrast could be obtained in 3 months.    07/03/2016 Imaging    Ct chest- No findings specific for metastatic disease in the chest.  Small thoracic lymph nodes measuring up to 9 mm short axis, decreased.  No suspicious pulmonary  nodules.      Problem List Patient Active Problem List   Diagnosis Date Noted  . Abdominal pain, chronic, epigastric [R10.13, G89.29] 03/29/2017  . Chronic diarrhea [K52.9] 03/29/2017  . Pulmonary nodule [R91.1] 05/29/2016  . COPD (chronic obstructive pulmonary disease) ([place]) [J44.9] 05/29/2016  . GERD (gastroesophageal reflux disease) [K21.9]  03/29/2016  . Esophageal dysphagia [R13.10] 03/29/2016  . Polysubstance abuse ([place]) [F19.10] 03/16/2016  . 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 Anxiety [F41.9] 09/04/2012  . Chronic back pain [M54.9, G89.29] 09/04/2012  . Alcohol abuse, daily use [F10.10] 09/04/2012  . Ventral hernia [K43.9] 09/04/2012    Past Medical History Past Medical History:  Diagnosis Date  . Alcohol abuse   . Anemia   . Anxiety 09/04/2012  . GERD (gastroesophageal reflux disease)   . GIST (gastrointestinal stroma tumor), malignant, colon ([place])   . Hx of bipolar disorder   . Malignant GIST ([place]) 09/23/2014   initially diagnosed in 2014  . Pancreatitis   . PE (physical exam), annual   . Pulmonary nodule 05/29/2016  . Tobacco abuse   . Ventral hernia     Past Surgical History Past Surgical History:  Procedure Laterality Date  . BIOPSY  04/29/2017   Procedure: BIOPSY;  Surgeon: Daneil Dolin, MD;  Location: [facility];  Service: Endoscopy;;  gastric and esophageal biopsy  . COLONOSCOPY WITH PROPOFOL [name] 04/29/2017   Procedure: COLONOSCOPY WITH PROPOFOL;  Surgeon: Daneil Dolin, MD;  Location: [facility];  Service: Endoscopy;  Laterality: [name];  11:00am  . ESOPHAGOGASTRODUODENOSCOPY (EGD) WITH PROPOFOL [name] 04/29/2017   Procedure: ESOPHAGOGASTRODUODENOSCOPY (EGD) WITH PROPOFOL;  Surgeon: Daneil Dolin, MD;  Location: [facility];  Service: Endoscopy;  Laterality: [name];  . [place] [name] 04/29/2017   Procedure: Venia Minks DILATION;  Surgeon: Daneil Dolin, MD;  Location: [facility];  Service: Endoscopy;  Laterality: [name];  . PARTIAL COLECTOMY    . POLYPECTOMY  04/29/2017   Procedure: POLYPECTOMY;  Surgeon: Daneil Dolin, MD;  Location: [facility];  Service: Endoscopy;;  hepatic flexure polyp  . PORTACATH PLACEMENT Right 03/14/2015   Procedure: INSERTION PORT-A-CATH;  Surgeon: Aviva Signs, MD;  Location: [facility] ORS;   Service: General;  Laterality: Right;  . SMALL INTESTINE SURGERY     small bowel resection for obstruction    Family History Family History  Problem Relation Age of Onset  . Cancer Mother   . Hypertension Father   . Cancer Brother   . Colon cancer Neg Hx      Social History  reports that he has been smoking cigarettes. He has a 18.00 pack-year smoking history. He has never used smokeless tobacco. He reports that he drinks about 24.0 standard drinks of alcohol per week. He reports that he has current or past drug history. Drug: Cocaine.  Medications  Current Outpatient Medications:  .  BELBUCA 75 MCG FILM, DISSOLVE 1 FILM UNDER THE TONGUE EVERY TWELVE HOURS FOR CHRONIC PAIN, Disp: , Rfl: 0 .  busPIRone (BUSPAR) 15 MG tablet, Take by mouth., Disp: , Rfl:  .  Calcium Carbonate (CALCIUM 600 PO), Take by mouth daily., Disp: , Rfl:  .  Cholecalciferol (VITAMIN D3) 50 MCG (2000 UT) capsule, Take 2,000 Units by mouth daily., Disp: , Rfl: 0 .  esomeprazole (NEXIUM) 20 MG capsule, Take 20 mg by mouth 2 (two) times daily., Disp: , Rfl: 0 .  imatinib (GLEEVEC) 400  MG tablet, Take 1 tablet (400 mg total) by mouth daily., Disp: 30 tablet, Rfl: 0 .  lidocaine-prilocaine (EMLA) cream, Apply 1 application topically as needed., Disp: 30 g, Rfl: 0 .  Magnesium 250 MG TABS, Take 250 mg by mouth 2 (two) times daily., Disp: , Rfl:  .  MAGNESIUM-OXIDE 400 (241.3 Mg) MG tablet, Take 1 tablet by mouth 2 (two) times daily., Disp: , Rfl: 0 .  methocarbamol (ROBAXIN) 750 MG tablet, Take 750 mg by mouth every 8 (eight) hours as needed., Disp: , Rfl: 0 .  mirtazapine (REMERON) 45 MG tablet, Take by mouth., Disp: , Rfl:  .  potassium chloride SA (K-DUR,KLOR-CON) 20 MEQ tablet, Take 3 tablets (60 mEq total) by mouth daily., Disp: 90 tablet, Rfl: 2 .  traZODone (DESYREL) 50 MG tablet, Take 50 mg by mouth at bedtime., Disp: , Rfl: 0  Allergies Patient has no known allergies.  Review of Systems Review of Systems  - Oncology ROS negative other than abdominal pain.     Physical Exam  Vitals Wt Readings from Last 3 Encounters:  01/07/18 187 lb (84.8 kg)  04/23/17 205 lb (93 kg)  03/29/17 209 lb 3.2 oz (94.9 kg)   Temp Readings from Last 3 Encounters:  01/07/18 97.9 F (36.6 C) (Oral)  04/29/17 97.7 F (36.5 C) (Oral)  04/23/17 98.3 F (36.8 C) (Oral)   BP Readings from Last 3 Encounters:  01/07/18 106/66  04/29/17 129/79  04/23/17 (!) 112/93   Pulse Readings from Last 3 Encounters:  01/07/18 (!) 106  04/29/17 83  04/23/17 100   Constitutional: Well-developed, well-nourished, and in no distress.   HENT: Head: Normocephalic and atraumatic.  Mouth/Throat: No oropharyngeal exudate. Mucosa moist. Eyes: Pupils are equal, round, and reactive to light. Conjunctivae are normal. No scleral icterus.  Neck: Normal range of motion. Neck supple. No JVD present.  Cardiovascular: Normal rate, regular rhythm and normal heart sounds.  Exam reveals no gallop and no friction rub.   No murmur heard. Pulmonary/Chest: Effort normal and breath sounds normal. No respiratory distress. No wheezes.No rales.  Abdominal: Soft. Bowel sounds are normal. Pt has multiple scars noted on abdomen from prior surgeries. Diffuse tenderness to palpation.   Musculoskeletal: No edema or tenderness.  Lymphadenopathy: No cervical, axillary or supraclavicular adenopathy.  Neurological: Alert and oriented to person, place, and time. No cranial nerve deficit.  Skin: Skin is warm and dry. No rash noted. No erythema. No pallor.  Psychiatric: Affect and judgment normal.   Labs Appointment on 01/07/2018  Component Date Value Ref Range Status  . WBC 01/07/2018 7.5  4.0 - 10.5 K/uL Final  . RBC 01/07/2018 3.15* 4.22 - 5.81 MIL/uL Final  . Hemoglobin 01/07/2018 10.7* 13.0 - 17.0 g/dL Final  . HCT 01/07/2018 32.2* 39.0 - 52.0 % Final  . MCV 01/07/2018 102.2* 80.0 - 100.0 fL Final  . MCH 01/07/2018 34.0  26.0 - 34.0 pg Final  .  MCHC 01/07/2018 33.2  30.0 - 36.0 g/dL Final  . RDW 01/07/2018 14.1  11.5 - 15.5 % Final  . Platelets 01/07/2018 342  150 - 400 K/uL Final  . nRBC 01/07/2018 0.0  0.0 - 0.2 % Final  . Neutrophils Relative % 01/07/2018 64  % Final  . Neutro Abs 01/07/2018 4.8  1.7 - 7.7 K/uL Final  . Lymphocytes Relative 01/07/2018 22  % Final  . Lymphs Abs 01/07/2018 1.7  0.7 - 4.0 K/uL Final  . Monocytes Relative 01/07/2018 11  % Final  .  Monocytes Absolute 01/07/2018 0.9  0.1 - 1.0 K/uL Final  . Eosinophils Relative 01/07/2018 1  % Final  . Eosinophils Absolute 01/07/2018 0.1  0.0 - 0.5 K/uL Final  . Basophils Relative 01/07/2018 1  % Final  . Basophils Absolute 01/07/2018 0.0  0.0 - 0.1 K/uL Final  . Immature Granulocytes 01/07/2018 1  % Final  . Abs Immature Granulocytes 01/07/2018 0.09* 0.00 - 0.07 K/uL Final   Performed at [facility], [address] [place], [place] [address]  . Sodium 01/07/2018 137  135 - 145 mmol/L Final  . Potassium 01/07/2018 3.3* 3.5 - 5.1 mmol/L Final  . Chloride 01/07/2018 104  98 - 111 mmol/L Final  . CO2 01/07/2018 22  22 - 32 mmol/L Final  . Glucose, Bld 01/07/2018 119* 70 - 99 mg/dL Final  . BUN 01/07/2018 7  6 - 20 mg/dL Final  . Creatinine, Ser 01/07/2018 0.91  0.61 - 1.24 mg/dL Final  . Calcium 01/07/2018 7.4* 8.9 - 10.3 mg/dL Final  . Total Protein 01/07/2018 6.7  6.5 - 8.1 g/dL Final  . Albumin 01/07/2018 3.1* 3.5 - 5.0 g/dL Final  . AST 01/07/2018 31  15 - 41 U/L Final  . ALT 01/07/2018 18  0 - 44 U/L Final  . Alkaline Phosphatase 01/07/2018 74  38 - 126 U/L Final  . Total Bilirubin 01/07/2018 0.2* 0.3 - 1.2 mg/dL Final  . GFR calc non Af Amer 01/07/2018 >60  >60 mL/min Final  . GFR calc Af Amer 01/07/2018 >60  >60 mL/min Final  . Anion gap 01/07/2018 11  5 - 15 Final   Performed at [facility], [address], [place], [place] [address]  . LDH 01/07/2018 182  98 - 192 U/L Final   Performed at [facility], [address], [place], [place] [address]      Pathology Orders Placed This Encounter  Procedures  . CT CHEST W CONTRAST    Standing Status:   Future    Standing Expiration Date:   01/07/2019    Order Specific Question:   If indicated for the ordered procedure, I authorize the administration of contrast media per Radiology protocol    Answer:   Yes    Order Specific Question:   Preferred imaging location?    Answer:   [facility]    Order Specific Question:   Radiology Contrast Protocol - do NOT remove file path    Answer:   \\[email or web address]  . CT ABDOMEN PELVIS W CONTRAST    Standing Status:   Future    Standing Expiration Date:   01/07/2019    Order Specific Question:   If indicated for the ordered procedure, I authorize the administration of contrast media per Radiology protocol    Answer:   Yes    Order Specific Question:   Preferred imaging location?    Answer:   [facility]    Order Specific Question:   Is Oral Contrast requested for this exam?    Answer:   Yes, Per Radiology protocol    Order Specific Question:   Radiology Contrast Protocol - do NOT remove file path    Answer:   \\[email or web address]  . CBC with Differential/Platelet    Standing Status:   Future    Number of Occurrences:   1    Standing Expiration Date:   01/08/2019  . Comprehensive metabolic panel    Standing Status:   Future    Number of Occurrences:   1  Standing Expiration Date:   01/08/2019  . Lactate dehydrogenase    Standing Status:   Future    Number of Occurrences:   1    Standing Expiration Date:   01/08/2019       Zoila Shutter MD

## 2018-01-29 ENCOUNTER — Ambulatory Visit (HOSPITAL_COMMUNITY)
Admission: RE | Admit: 2018-01-29 | Discharge: 2018-01-29 | Disposition: A | Discharge status: Home / Self Care | Payer: Medicaid Other | Source: Ambulatory Visit | Attending: Internal Medicine | Admitting: Internal Medicine

## 2018-01-29 DIAGNOSIS — C49A3 Gastrointestinal stromal tumor of small intestine: Secondary | ICD-10-CM | POA: Diagnosis not present

## 2018-01-29 MED ORDER — IOPAMIDOL (ISOVUE-300) INJECTION 61%
100.0000 mL | Freq: Once | INTRAVENOUS | Status: AC | PRN
  Administered 2018-01-29: 100 mL via INTRAVENOUS

## 2018-01-30 ENCOUNTER — Ambulatory Visit (HOSPITAL_COMMUNITY): Payer: Medicaid Other | Admitting: Hematology

## 2018-02-03 ENCOUNTER — Ambulatory Visit (HOSPITAL_COMMUNITY): Payer: Medicaid Other | Admitting: Hematology

## 2018-02-10 ENCOUNTER — Ambulatory Visit (HOSPITAL_COMMUNITY): Payer: Medicaid Other | Admitting: Hematology

## 2018-02-21 ENCOUNTER — Inpatient Hospital Stay (HOSPITAL_COMMUNITY): Payer: Medicaid Other

## 2018-02-21 ENCOUNTER — Telehealth (HOSPITAL_COMMUNITY): Payer: Self-pay | Admitting: Hematology

## 2018-02-21 ENCOUNTER — Other Ambulatory Visit: Payer: Self-pay

## 2018-02-21 ENCOUNTER — Telehealth (HOSPITAL_COMMUNITY): Payer: Self-pay | Admitting: Pharmacist

## 2018-02-21 ENCOUNTER — Encounter (HOSPITAL_COMMUNITY): Payer: Self-pay | Admitting: Hematology

## 2018-02-21 ENCOUNTER — Other Ambulatory Visit (HOSPITAL_COMMUNITY): Payer: Self-pay | Admitting: Nurse Practitioner

## 2018-02-21 ENCOUNTER — Inpatient Hospital Stay (HOSPITAL_COMMUNITY): Payer: Medicaid Other | Attending: Internal Medicine | Admitting: Hematology

## 2018-02-21 VITALS — BP 120/77 | HR 82 | Temp 98.5°F | Resp 20 | Wt 196.0 lb

## 2018-02-21 DIAGNOSIS — R600 Localized edema: Secondary | ICD-10-CM | POA: Insufficient documentation

## 2018-02-21 DIAGNOSIS — K219 Gastro-esophageal reflux disease without esophagitis: Secondary | ICD-10-CM | POA: Diagnosis not present

## 2018-02-21 DIAGNOSIS — F419 Anxiety disorder, unspecified: Secondary | ICD-10-CM | POA: Insufficient documentation

## 2018-02-21 DIAGNOSIS — F1011 Alcohol abuse, in remission: Secondary | ICD-10-CM | POA: Insufficient documentation

## 2018-02-21 DIAGNOSIS — C49A3 Gastrointestinal stromal tumor of small intestine: Secondary | ICD-10-CM

## 2018-02-21 DIAGNOSIS — C49A9 Gastrointestinal stromal tumor of other sites: Secondary | ICD-10-CM

## 2018-02-21 DIAGNOSIS — R200 Anesthesia of skin: Secondary | ICD-10-CM | POA: Diagnosis not present

## 2018-02-21 DIAGNOSIS — Z9119 Patient's noncompliance with other medical treatment and regimen: Secondary | ICD-10-CM | POA: Insufficient documentation

## 2018-02-21 DIAGNOSIS — Z79899 Other long term (current) drug therapy: Secondary | ICD-10-CM

## 2018-02-21 DIAGNOSIS — F17210 Nicotine dependence, cigarettes, uncomplicated: Secondary | ICD-10-CM | POA: Insufficient documentation

## 2018-02-21 DIAGNOSIS — D696 Thrombocytopenia, unspecified: Secondary | ICD-10-CM | POA: Insufficient documentation

## 2018-02-21 DIAGNOSIS — C786 Secondary malignant neoplasm of retroperitoneum and peritoneum: Secondary | ICD-10-CM | POA: Insufficient documentation

## 2018-02-21 DIAGNOSIS — F1411 Cocaine abuse, in remission: Secondary | ICD-10-CM | POA: Diagnosis not present

## 2018-02-21 DIAGNOSIS — R110 Nausea: Secondary | ICD-10-CM | POA: Diagnosis not present

## 2018-02-21 DIAGNOSIS — E538 Deficiency of other specified B group vitamins: Secondary | ICD-10-CM | POA: Diagnosis not present

## 2018-02-21 DIAGNOSIS — F1410 Cocaine abuse, uncomplicated: Secondary | ICD-10-CM

## 2018-02-21 LAB — LACTATE DEHYDROGENASE: LDH: 239 U/L — ABNORMAL HIGH (ref 98–192)

## 2018-02-21 LAB — COMPREHENSIVE METABOLIC PANEL
ALT: 14 U/L (ref 0–44)
AST: 32 U/L (ref 15–41)
Albumin: 3 g/dL — ABNORMAL LOW (ref 3.5–5.0)
Alkaline Phosphatase: 83 U/L (ref 38–126)
Anion gap: 7 (ref 5–15)
BUN: 8 mg/dL (ref 6–20)
CO2: 28 mmol/L (ref 22–32)
Calcium: 7.9 mg/dL — ABNORMAL LOW (ref 8.9–10.3)
Chloride: 104 mmol/L (ref 98–111)
Creatinine, Ser: 0.84 mg/dL (ref 0.61–1.24)
GFR calc Af Amer: >60 mL/min (ref >60)
Glucose, Bld: 93 mg/dL (ref 70–99)
Potassium: 3.6 mmol/L (ref 3.5–5.1)
Sodium: 139 mmol/L (ref 135–145)
Total Bilirubin: 0.7 mg/dL (ref 0.3–1.2)
Total Protein: 6.3 g/dL — ABNORMAL LOW (ref 6.5–8.1)

## 2018-02-21 LAB — CBC WITH DIFFERENTIAL/PLATELET
Abs Immature Granulocytes: 0.03 10*3/uL (ref 0.00–0.07)
Basophils Absolute: 0 10*3/uL (ref 0.0–0.1)
Basophils Relative: 0 %
Eosinophils Absolute: 0 10*3/uL (ref 0.0–0.5)
Eosinophils Relative: 1 %
HCT: 33.2 % — ABNORMAL LOW (ref 39.0–52.0)
Hemoglobin: 10.9 g/dL — ABNORMAL LOW (ref 13.0–17.0)
Immature Granulocytes: 1 %
Lymphocytes Relative: 16 %
Lymphs Abs: 1.1 10*3/uL (ref 0.7–4.0)
MCH: 37.1 pg — ABNORMAL HIGH (ref 26.0–34.0)
MCHC: 32.8 g/dL (ref 30.0–36.0)
MCV: 112.9 fL — ABNORMAL HIGH (ref 80.0–100.0)
MONO ABS: 0.5 10*3/uL (ref 0.1–1.0)
Monocytes Relative: 8 %
Neutro Abs: 4.9 10*3/uL (ref 1.7–7.7)
Neutrophils Relative %: 74 %
PLATELETS: 37 10*3/uL — AB (ref 150–400)
RBC: 2.94 MIL/uL — AB (ref 4.22–5.81)
RDW: 23.1 % — AB (ref 11.5–15.5)
WBC: 6.6 10*3/uL (ref 4.0–10.5)
nRBC: 0 % (ref 0.0–0.2)

## 2018-02-21 LAB — IRON AND TIBC
IRON: 144 ug/dL (ref 45–182)
Saturation Ratios: 54 % — ABNORMAL HIGH (ref 17.9–39.5)
TIBC: 265 ug/dL (ref 250–450)
UIBC: 121 ug/dL

## 2018-02-21 LAB — VITAMIN B12: Vitamin B-12: 98 pg/mL — ABNORMAL LOW (ref 180–914)

## 2018-02-21 LAB — FERRITIN: Ferritin: 143 ng/mL (ref 24–336)

## 2018-02-21 LAB — FOLATE: Folate: 6.3 ng/mL (ref >5.9)

## 2018-02-21 MED ORDER — IMATINIB MESYLATE 400 MG PO TABS: 400.0000 mg | ORAL_TABLET | Freq: Two times a day (BID) | ORAL | 1 refills | Status: DC

## 2018-02-21 NOTE — Patient Instructions (Signed)
[facility] Discharge Instructions     Thank you for choosing [facility] to provide your oncology and hematology care.  To afford each patient quality time with our provider, please arrive at least 15 minutes before your scheduled appointment time.   If you have a lab appointment with the Cancer Center please come in thru the  Main Entrance and check in at the main information desk  You need to re-schedule your appointment should you arrive 10 or more minutes late.  We strive to give you quality time with our providers, and arriving late affects you and other patients whose appointments are after yours.  Also, if you no show three or more times for appointments you may be dismissed from the clinic at the providers discretion.     Again, thank you for choosing [facility].  Our hope is that these requests will decrease the amount of time that you wait before being seen by our physicians.       _____________________________________________________________  Should you have questions after your visit to [facility], please contact our office at [phone] between the hours of 8:00 a.m. and 4:30 p.m.  Voicemails left after 4:00 p.m. will not be returned until the following business day.  For prescription refill requests, have your pharmacy contact our office and allow 72 hours.    Cancer Center Support Programs:   > Cancer Support Group  2nd Tuesday of the month 1pm-2pm, Journey Room    

## 2018-02-21 NOTE — Telephone Encounter (Signed)
FAXED GLEEVEC SCRIPT TO [facility]

## 2018-02-21 NOTE — Progress Notes (Signed)
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    02/10/2015 Imaging    CT CAP-  Mildly reduced size of left abdominal tumor implants. No new tumor implants identified.    03/14/2015 Procedure    Port-a-cath placed by Dr. Arnoldo Morale    03/14/2016 Imaging    CT CAP- 1. Continued reduction in size of left abdominal peritoneal metastases. No new or progressive metastatic disease in the abdomen or pelvis. 2. Solitary new irregular 1.0 cm nodular opacity in the anterior right upper lobe, highly nonspecific, potentially inflammatory, however cannot exclude pulmonary malignancy (metastatic or primary lung carcinoma). New nonspecific mild subcarinal and right hilar lymphadenopathy, cannot exclude new nodal metastases. PET-CT could be useful for further characterization / biopsy planning, as clinically warranted. Alternatively, a follow-up chest CT with IV contrast could be obtained in 3 months.    07/03/2016 Imaging    Ct chest- No findings specific for metastatic disease in the chest.  Small thoracic lymph nodes measuring up to 9 mm short axis, decreased.  No suspicious pulmonary nodules.     INTERVAL HISTORY:  [name] 58 y.o. male returns for routine follow-up for GIST of small intestine. He is tolerating the medication well. He does have occasional nausea. He reports numbness of his feet that is stable at this time. Denies any vomiting or diarrhea. Denies any new pains. Had not  noticed any recent bleeding such as epistaxis, hematuria or hematochezia. Denies recent chest pain on exertion, shortness of breath on minimal exertion, pre-syncopal episodes, or palpitations. Denies any numbness or tingling in hands or feet. Denies any recent fevers, infections, or recent hospitalizations. He reports his appetite is 100%.    REVIEW OF SYSTEMS:  Review of Systems  Gastrointestinal: Positive for nausea.  Neurological: Positive for numbness.  All other systems reviewed and are negative.    PAST MEDICAL/SURGICAL HISTORY:  Past Medical History:  Diagnosis Date  . Alcohol abuse   . Anemia   . Anxiety 09/04/2012  . GERD (gastroesophageal reflux disease)   . GIST (gastrointestinal stroma tumor), malignant, colon ([place])   . Hx of bipolar disorder   . Malignant GIST ([place]) 09/23/2014   initially diagnosed in 2014  . Pancreatitis   . PE (physical exam), annual   . Pulmonary nodule 05/29/2016  . Tobacco abuse   . Ventral hernia    Past Surgical History:  Procedure Laterality Date  . BIOPSY  04/29/2017   Procedure: BIOPSY;  Surgeon: Daneil Dolin, MD;  [facility];  Service: Endoscopy;;  gastric and esophageal biopsy  . COLONOSCOPY WITH PROPOFOL [name] 04/29/2017   Procedure: COLONOSCOPY WITH PROPOFOL;  Surgeon: Daneil Dolin, MD;  [facility];  Service: Endoscopy;  Laterality: [name];  11:00am  . ESOPHAGOGASTRODUODENOSCOPY (EGD) WITH PROPOFOL [name] 04/29/2017   Procedure: ESOPHAGOGASTRODUODENOSCOPY (EGD) WITH PROPOFOL;  Surgeon: Daneil Dolin, MD;  [facility];  Service: Endoscopy;  Laterality: [name];  . HERNIA REPAIR    . MALONEY DILATION [name]  04/29/2017   Procedure: Venia Minks DILATION;  Surgeon: Daneil Dolin, MD;  [facility];  Service: Endoscopy;  Laterality: [name];  . PARTIAL COLECTOMY    . POLYPECTOMY  04/29/2017   Procedure: POLYPECTOMY;  Surgeon: Daneil Dolin, MD;  [facility];  Service: Endoscopy;;  hepatic flexure  polyp  . PORTACATH PLACEMENT Right 03/14/2015   Procedure: INSERTION PORT-A-CATH;  Surgeon: Aviva Signs, MD;  Location: [facility];  Service: General;  Laterality: Right;  . SMALL INTESTINE SURGERY     small bowel resection for obstruction     SOCIAL HISTORY:  Social History   Socioeconomic History  . Marital status: Single    Spouse name: Not on file  . Number of children: Not on file  . Years of education: Not on file  . Highest education level: Not on file  Occupational History  . Not on file  Social Needs  . Financial resource strain: Not on file  . Food insecurity:    Worry: Not on file    Inability: Not on file  . Transportation needs:    Medical: Not on file    Non-medical: Not on file  Tobacco Use  . Smoking status: Current Every Day Smoker    Packs/day: 0.50    Years: 36.00    Pack years: 18.00    Types: Cigarettes  . Smokeless tobacco: Never Used  . Tobacco comment: refused smoking cessation materials.   Substance and Sexual Activity  . Alcohol use: Yes    Alcohol/week: 24.0 standard drinks    Types: 24 Cans of beer per week    Comment: 2 - 3 cans beer daily  . Drug use: Yes    Types: Cocaine    Comment: denied 03/29/17, none in 6-7 months. "occasional"   . Sexual activity: Not Currently  Lifestyle  . Physical activity:    Days per week: Not on file    Minutes per session: Not on file  . Stress: Not on file  Relationships  . Social connections:    Talks on phone: Not on file    Gets together: Not on file    Attends religious service: Not on file    Active member of club or organization: Not on file    Attends meetings of clubs or organizations: Not on file    Relationship status: Not on file  . Intimate partner violence:    Fear of current or ex partner: Not on file    Emotionally abused: Not on file    Physically abused: Not on file    Forced sexual activity: Not on file  Other Topics Concern  . Not on file  Social History Narrative  . Not on  file    FAMILY HISTORY:  Family History  Problem Relation Age of Onset  . Cancer Mother   . Hypertension Father   . Cancer Brother   . Colon cancer Neg Hx     CURRENT MEDICATIONS:  Outpatient Encounter Medications as of 02/21/2018  Medication Sig  . BELBUCA 75 MCG FILM Increased to 319mcg  . busPIRone (BUSPAR) 15 MG tablet Take by mouth.  . Calcium Carbonate (CALCIUM 600 PO) Take by mouth daily.  . Cholecalciferol (VITAMIN D3) 50 MCG (2000 UT) capsule Take 2,000 Units by mouth daily.  Marland Kitchen esomeprazole (NEXIUM) 20 MG capsule Take 20 mg by mouth 2 (two) times daily.  Marland Kitchen imatinib (GLEEVEC) 400 MG tablet Take 1 tablet (400 mg total) by mouth 2 (two) times  daily.  . lidocaine-prilocaine (EMLA) cream Apply 1 application topically as needed.  . Magnesium 250 MG TABS Take 250 mg by mouth 2 (two) times daily.  Marland Kitchen MAGNESIUM-OXIDE 400 (241.3 Mg) MG tablet Take 1 tablet by mouth 2 (two) times daily.  . methocarbamol (ROBAXIN) 750 MG tablet Take 750 mg by mouth every 8 (eight) hours as needed.  . mirtazapine (REMERON) 45 MG tablet Take by mouth.  . potassium chloride SA (K-DUR,KLOR-CON) 20 MEQ tablet Take 3 tablets (60 mEq total) by mouth daily.  . traZODone (DESYREL) 50 MG tablet Take 50 mg by mouth at bedtime.  . [DISCONTINUED] imatinib (GLEEVEC) 400 MG tablet Take 1 tablet (400 mg total) by mouth daily.   No facility-administered encounter medications on file as of 02/21/2018.     ALLERGIES:  No Known Allergies   PHYSICAL EXAM:  ECOG Performance status: 1  Vitals:   02/21/18 1113  BP: 120/77  Pulse: 82  Resp: 20  Temp: 98.5 F (36.9 C)   Filed Weights   02/21/18 1113  Weight: 196 lb (88.9 kg)    Physical Exam Constitutional:      Appearance: Normal appearance. He is normal weight.  Cardiovascular:     Rate and Rhythm: Normal rate and regular rhythm.     Heart sounds: Normal heart sounds.  Pulmonary:     Effort: Pulmonary effort is normal.     Breath sounds: Normal breath  sounds.  Musculoskeletal: Normal range of motion.  Skin:    General: Skin is warm and dry.  Neurological:     Mental Status: He is alert and oriented to person, place, and time. Mental status is at baseline.  Psychiatric:        Mood and Affect: Mood normal.        Behavior: Behavior normal.        Thought Content: Thought content normal.        Judgment: Judgment normal.      LABORATORY DATA:  I have reviewed the labs as listed.  CBC    Component Value Date/Time   WBC 7.5 01/07/2018 1213   RBC 3.15 (L) 01/07/2018 1213   HGB 10.7 (L) 01/07/2018 1213   HCT 32.2 (L) 01/07/2018 1213   PLT 342 01/07/2018 1213   MCV 102.2 (H) 01/07/2018 1213   MCH 34.0 01/07/2018 1213   MCHC 33.2 01/07/2018 1213   RDW 14.1 01/07/2018 1213   LYMPHSABS 1.7 01/07/2018 1213   MONOABS 0.9 01/07/2018 1213   EOSABS 0.1 01/07/2018 1213   BASOSABS 0.0 01/07/2018 1213   CMP Latest Ref Rng & Units 01/07/2018 04/29/2017 04/23/2017  Glucose 70 - 99 mg/dL 119(H) 87 119(H)  BUN 6 - 20 mg/dL 7 7 6   Creatinine 0.61 - 1.24 mg/dL 0.91 0.91 0.95  Sodium 135 - 145 mmol/L 137 142 137  Potassium 3.5 - 5.1 mmol/L 3.3(L) 3.5 4.1  Chloride 98 - 111 mmol/L 104 102 104  CO2 22 - 32 mmol/L 22 28 21(L)  Calcium 8.9 - 10.3 mg/dL 7.4(L) 8.0(L) 8.6(L)  Total Protein 6.5 - 8.1 g/dL 6.7 7.0 -  Total Bilirubin 0.3 - 1.2 mg/dL 0.2(L) 0.3 -  Alkaline Phos 38 - 126 U/L 74 89 -  AST 15 - 41 U/L 31 32 -  ALT 0 - 44 U/L 18 21 -       DIAGNOSTIC IMAGING:  I have independently reviewed the scans and discussed with the patient.   I have reviewed Francene Finders, NP's note and agree  with the documentation.  I personally performed a face-to-face visit, made revisions and my assessment and plan is as follows.    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  Stage IV GIST: - Diagnosed in 2014, status post partial small bowel resection on 09/04/2012 with recurrence of disease  with extensive peritoneal disease. -he was started on [place] in March 2015. - He is tolerating Gleevec 400 mg daily very well.  He occasionally gets nauseous. - Denies any abdominal pains or bloating.  Denies any bowel habit changes.  Denies any bleeding per rectum or melena. - We reviewed the results of the CT abdomen pelvis dated 01/29/2018 which shows 2 cavitary soft tissue nodules in the left abdomen concerning for progressive disease.  Larger of the 2 lesions is contiguous with the descending colon and is gas-filled, with possible communication with the colonic lumen..  Measures 3.3 cm is contiguous with adjacent small bowel and is at the location of 15 mm nodule on prior scan from October 2018.  The smaller of the 2 lesions is more proximal along the descending colon measuring 1.2 x 1.7 cm. - I have discussed various options including starting him on Sutent 50 mg 4 weeks on 2 weeks off versus increasing imatinib to 400 mg twice daily.  As he is tolerating imatinib very well, I am inclined towards increasing the dose to twice a day.  We talked about side effects in detail.  We have sent a prescription to his pharmacy. -I will see him back in 2 to 3 weeks for follow-up to see how he is tolerating. - We will do blood work today.  2.  Polysubstance abuse: -Reading through the chart, I see documentation of history of EtOH and cocaine abuse with urine drug screen positive for cocaine and negative for opioids on multiple occasions. - Chart also reports that he was noncompliant and missed many follow-up appointments. -I have reinforced to him that he should not miss his appointments.      Orders placed this encounter:  Orders Placed This Encounter  Procedures  . Lactate dehydrogenase  . CBC with Differential/Platelet  . Comprehensive metabolic panel  . CBC with Differential/Platelet  . Comprehensive metabolic panel  . Ferritin  . Iron and TIBC  . Vitamin B12  . Folate  . Lactate  dehydrogenase      Derek Jack, MD [place] [phone]

## 2018-02-21 NOTE — Assessment & Plan Note (Addendum)
1.  Stage IV GIST: - Diagnosed in 2014, status post partial small bowel resection on 09/04/2012 with recurrence of disease with extensive peritoneal disease. -he was started on [place] in March 2015. - He is tolerating Gleevec 400 mg daily very well.  He occasionally gets nauseous. - Denies any abdominal pains or bloating.  Denies any bowel habit changes.  Denies any bleeding per rectum or melena. - We reviewed the results of the CT abdomen pelvis dated 01/29/2018 which shows 2 cavitary soft tissue nodules in the left abdomen concerning for progressive disease.  Larger of the 2 lesions is contiguous with the descending colon and is gas-filled, with possible communication with the colonic lumen..  Measures 3.3 cm is contiguous with adjacent small bowel and is at the location of 15 mm nodule on prior scan from October 2018.  The smaller of the 2 lesions is more proximal along the descending colon measuring 1.2 x 1.7 cm. - I have discussed various options including starting him on Sutent 50 mg 4 weeks on 2 weeks off versus increasing imatinib to 400 mg twice daily.  As he is tolerating imatinib very well, I am inclined towards increasing the dose to twice a day.  We talked about side effects in detail.  We have sent a prescription to his pharmacy. -I will see him back in 2 to 3 weeks for follow-up to see how he is tolerating. - We will do blood work today.  2.  Polysubstance abuse: -Reading through the chart, I see documentation of history of EtOH and cocaine abuse with urine drug screen positive for cocaine and negative for opioids on multiple occasions. - Chart also reports that he was noncompliant and missed many follow-up appointments. -I have reinforced to him that he should not miss his appointments.

## 2018-02-21 NOTE — Progress Notes (Signed)
Patient is taking gleevec and has not missed any doses and reports no side effects at this time.

## 2018-02-21 NOTE — Telephone Encounter (Signed)
Oral Oncology Pharmacist Encounter  Received a dose increase prescription for Gleevec (imatinib) for the treatment of Stage IV GIST of small intestine, planned duration until disease progression or unacceptable drug toxicity. Ms Pagnotta is increasing her imatinib from 400mg  daily to 400mg  twice daily.   CBC/CMP from 02/21/2018 assessed, no relevant lab abnormalities. Prescription dose and frequency assessed.   Current medication list in Epic reviewed, several DDIs with imatinib identified: - Imatinib may decrease the metabolism of Belbuca, buspirone, mirtazapine, trazodone. Monitor patient for increased effects of the listed medications. These are not new interactions as Ms. Bezanson has been on imatinib, but the the dose increase of imatinib continue to monitor.  Prescription has been e-scribed to the [facility] for benefits analysis and approval.  Oral Oncology Clinic will continue to follow for insurance authorization, copayment issues, initial counseling and start date.  Darl Pikes, PharmD, BCPS, [facility] Hematology/Oncology Clinical Pharmacist [facility] [place] [facility] [phone]  02/21/2018 4:18 PM

## 2018-02-24 ENCOUNTER — Telehealth (HOSPITAL_COMMUNITY): Payer: Self-pay | Admitting: Pharmacy Technician

## 2018-02-24 MED ORDER — IMATINIB MESYLATE 400 MG PO TABS: 400.0000 mg | ORAL_TABLET | Freq: Two times a day (BID) | ORAL | 1 refills | Status: DC

## 2018-02-24 NOTE — Telephone Encounter (Signed)
Oral Oncology Patient Advocate Encounter  After completing a benefits investigation, prior authorization for [place] is not required through Medicaid.  Patient's copay is $3.00.  [place] Patient [place] Phone [phone] Fax [phone] 02/24/2018 11:41 AM

## 2018-02-26 ENCOUNTER — Inpatient Hospital Stay (HOSPITAL_COMMUNITY): Payer: Medicaid Other

## 2018-02-26 DIAGNOSIS — C49A3 Gastrointestinal stromal tumor of small intestine: Secondary | ICD-10-CM | POA: Diagnosis not present

## 2018-02-26 LAB — CBC WITH DIFFERENTIAL/PLATELET
Abs Immature Granulocytes: 0.03 10*3/uL (ref 0.00–0.07)
Basophils Absolute: 0 10*3/uL (ref 0.0–0.1)
Basophils Relative: 1 %
Eosinophils Absolute: 0.1 10*3/uL (ref 0.0–0.5)
Eosinophils Relative: 2 %
HCT: 31.3 % — ABNORMAL LOW (ref 39.0–52.0)
Hemoglobin: 10.2 g/dL — ABNORMAL LOW (ref 13.0–17.0)
IMMATURE GRANULOCYTES: 1 %
LYMPHS ABS: 1.4 10*3/uL (ref 0.7–4.0)
LYMPHS PCT: 24 %
MCH: 37.8 pg — ABNORMAL HIGH (ref 26.0–34.0)
MCHC: 32.6 g/dL (ref 30.0–36.0)
MCV: 115.9 fL — ABNORMAL HIGH (ref 80.0–100.0)
Monocytes Absolute: 0.8 10*3/uL (ref 0.1–1.0)
Monocytes Relative: 13 %
NRBC: 0 % (ref 0.0–0.2)
Neutro Abs: 3.6 10*3/uL (ref 1.7–7.7)
Neutrophils Relative %: 59 %
Platelets: 96 10*3/uL — ABNORMAL LOW (ref 150–400)
RBC: 2.7 MIL/uL — ABNORMAL LOW (ref 4.22–5.81)
RDW: 21.9 % — ABNORMAL HIGH (ref 11.5–15.5)
WBC: 6 10*3/uL (ref 4.0–10.5)

## 2018-02-26 LAB — COMPREHENSIVE METABOLIC PANEL
ALK PHOS: 67 U/L (ref 38–126)
ALT: 12 U/L (ref 0–44)
AST: 22 U/L (ref 15–41)
Albumin: 2.7 g/dL — ABNORMAL LOW (ref 3.5–5.0)
Anion gap: 7 (ref 5–15)
BUN: 8 mg/dL (ref 6–20)
CO2: 26 mmol/L (ref 22–32)
Calcium: 7.6 mg/dL — ABNORMAL LOW (ref 8.9–10.3)
Chloride: 102 mmol/L (ref 98–111)
Creatinine, Ser: 0.87 mg/dL (ref 0.61–1.24)
GFR calc Af Amer: >60 mL/min (ref >60)
GFR calc non Af Amer: >60 mL/min (ref >60)
GLUCOSE: 102 mg/dL — AB (ref 70–99)
Potassium: 3.4 mmol/L — ABNORMAL LOW (ref 3.5–5.1)
Sodium: 135 mmol/L (ref 135–145)
Total Bilirubin: 0.4 mg/dL (ref 0.3–1.2)
Total Protein: 5.8 g/dL — ABNORMAL LOW (ref 6.5–8.1)

## 2018-02-27 ENCOUNTER — Other Ambulatory Visit (HOSPITAL_COMMUNITY): Payer: Self-pay | Admitting: Nurse Practitioner

## 2018-02-27 NOTE — Progress Notes (Signed)
Patient was called on 1/10 and told to hold his Gleevac due to low platelets. He was instructed to come back on 1/15 for lab recheck. Labs were rechecked on the 1/15 and platelets were still low. He will come back 1/20 for lab redraw and we will make a decision about his medication.

## 2018-02-28 MED ORDER — IMATINIB MESYLATE 400 MG PO TABS: 400.0000 mg | ORAL_TABLET | Freq: Two times a day (BID) | ORAL | 1 refills | Status: DC

## 2018-02-28 NOTE — Telephone Encounter (Signed)
Oral Chemotherapy Pharmacist Encounter   [name] would like to continue to use [facility] to fill his Gleevec prescription. Gleevec dosing increase prescription was rerouted to [facility].  Darl Pikes, PharmD, BCPS, [facility] Hematology/Oncology Clinical Pharmacist [facility] [place] [facility] [phone]  02/28/2018 2:17 PM

## 2018-02-28 NOTE — Telephone Encounter (Signed)
Oral Oncology Patient Advocate Encounter  Called Mr Penland to set up delivery of [place] and he stated he did not want to switch pharmacies and that he is happy with [facility].  I offered our services again and he said no that he used [facility] for 3 years and will stay with them.  [place] Patient [place] Phone [phone] Fax [phone] 02/28/2018 10:23 AM

## 2018-03-03 ENCOUNTER — Other Ambulatory Visit (HOSPITAL_COMMUNITY): Payer: Medicaid Other

## 2018-03-05 ENCOUNTER — Inpatient Hospital Stay (HOSPITAL_COMMUNITY): Payer: Medicaid Other

## 2018-03-05 DIAGNOSIS — C49A3 Gastrointestinal stromal tumor of small intestine: Secondary | ICD-10-CM | POA: Diagnosis not present

## 2018-03-05 LAB — COMPREHENSIVE METABOLIC PANEL
ALT: 19 U/L (ref 0–44)
AST: 34 U/L (ref 15–41)
Albumin: 2.9 g/dL — ABNORMAL LOW (ref 3.5–5.0)
Alkaline Phosphatase: 87 U/L (ref 38–126)
Anion gap: 8 (ref 5–15)
BUN: <5 mg/dL — ABNORMAL LOW (ref 6–20)
CHLORIDE: 104 mmol/L (ref 98–111)
CO2: 26 mmol/L (ref 22–32)
Calcium: 8.1 mg/dL — ABNORMAL LOW (ref 8.9–10.3)
Creatinine, Ser: 0.79 mg/dL (ref 0.61–1.24)
GFR calc Af Amer: >60 mL/min (ref >60)
GFR calc non Af Amer: >60 mL/min (ref >60)
Glucose, Bld: 247 mg/dL — ABNORMAL HIGH (ref 70–99)
POTASSIUM: 4.4 mmol/L (ref 3.5–5.1)
Sodium: 138 mmol/L (ref 135–145)
Total Bilirubin: 0.2 mg/dL — ABNORMAL LOW (ref 0.3–1.2)
Total Protein: 6.4 g/dL — ABNORMAL LOW (ref 6.5–8.1)

## 2018-03-05 LAB — CBC WITH DIFFERENTIAL/PLATELET
Abs Immature Granulocytes: 0.03 10*3/uL (ref 0.00–0.07)
BASOS PCT: 1 %
Basophils Absolute: 0 10*3/uL (ref 0.0–0.1)
Eosinophils Absolute: 0 10*3/uL (ref 0.0–0.5)
Eosinophils Relative: 1 %
HCT: 33.5 % — ABNORMAL LOW (ref 39.0–52.0)
Hemoglobin: 11.3 g/dL — ABNORMAL LOW (ref 13.0–17.0)
Immature Granulocytes: 1 %
Lymphocytes Relative: 17 %
Lymphs Abs: 1 10*3/uL (ref 0.7–4.0)
MCH: 38.2 pg — ABNORMAL HIGH (ref 26.0–34.0)
MCHC: 33.7 g/dL (ref 30.0–36.0)
MCV: 113.2 fL — ABNORMAL HIGH (ref 80.0–100.0)
Monocytes Absolute: 0.5 10*3/uL (ref 0.1–1.0)
Monocytes Relative: 8 %
NEUTROS ABS: 4.4 10*3/uL (ref 1.7–7.7)
Neutrophils Relative %: 72 %
PLATELETS: 120 10*3/uL — AB (ref 150–400)
RBC: 2.96 MIL/uL — ABNORMAL LOW (ref 4.22–5.81)
RDW: 20.1 % — AB (ref 11.5–15.5)
WBC: 6 10*3/uL (ref 4.0–10.5)
nRBC: 0 % (ref 0.0–0.2)

## 2018-03-05 LAB — LACTATE DEHYDROGENASE: LDH: 184 U/L (ref 98–192)

## 2018-03-06 ENCOUNTER — Other Ambulatory Visit (HOSPITAL_COMMUNITY): Payer: Self-pay | Admitting: Nurse Practitioner

## 2018-03-06 ENCOUNTER — Encounter (HOSPITAL_COMMUNITY): Payer: Self-pay | Admitting: *Deleted

## 2018-03-06 ENCOUNTER — Telehealth (HOSPITAL_COMMUNITY): Payer: Self-pay | Admitting: *Deleted

## 2018-03-06 DIAGNOSIS — C49A9 Gastrointestinal stromal tumor of other sites: Secondary | ICD-10-CM

## 2018-03-06 NOTE — Progress Notes (Signed)
Per Dr. Tomie China request, I called patient and told him to begin Gleevec 400 mg once daily in the mornings.  He verbalizes understanding.  He is aware of his appointment next week.

## 2018-03-12 ENCOUNTER — Other Ambulatory Visit: Payer: Self-pay

## 2018-03-12 ENCOUNTER — Encounter (HOSPITAL_COMMUNITY): Payer: Self-pay | Admitting: Hematology

## 2018-03-12 ENCOUNTER — Inpatient Hospital Stay (HOSPITAL_BASED_OUTPATIENT_CLINIC_OR_DEPARTMENT_OTHER): Payer: Medicaid Other | Admitting: Hematology

## 2018-03-12 ENCOUNTER — Inpatient Hospital Stay (HOSPITAL_COMMUNITY): Payer: Medicaid Other

## 2018-03-12 VITALS — BP 82/56 | HR 95 | Temp 97.9°F | Resp 18 | Wt 199.3 lb

## 2018-03-12 DIAGNOSIS — E538 Deficiency of other specified B group vitamins: Secondary | ICD-10-CM | POA: Diagnosis not present

## 2018-03-12 DIAGNOSIS — C786 Secondary malignant neoplasm of retroperitoneum and peritoneum: Secondary | ICD-10-CM

## 2018-03-12 DIAGNOSIS — F1411 Cocaine abuse, in remission: Secondary | ICD-10-CM

## 2018-03-12 DIAGNOSIS — F17210 Nicotine dependence, cigarettes, uncomplicated: Secondary | ICD-10-CM

## 2018-03-12 DIAGNOSIS — D696 Thrombocytopenia, unspecified: Secondary | ICD-10-CM | POA: Diagnosis not present

## 2018-03-12 DIAGNOSIS — F419 Anxiety disorder, unspecified: Secondary | ICD-10-CM

## 2018-03-12 DIAGNOSIS — C49A3 Gastrointestinal stromal tumor of small intestine: Secondary | ICD-10-CM

## 2018-03-12 DIAGNOSIS — Z9119 Patient's noncompliance with other medical treatment and regimen: Secondary | ICD-10-CM

## 2018-03-12 DIAGNOSIS — Z79899 Other long term (current) drug therapy: Secondary | ICD-10-CM

## 2018-03-12 DIAGNOSIS — K219 Gastro-esophageal reflux disease without esophagitis: Secondary | ICD-10-CM

## 2018-03-12 DIAGNOSIS — C49A9 Gastrointestinal stromal tumor of other sites: Secondary | ICD-10-CM

## 2018-03-12 DIAGNOSIS — F1011 Alcohol abuse, in remission: Secondary | ICD-10-CM

## 2018-03-12 LAB — CBC WITH DIFFERENTIAL/PLATELET
Abs Immature Granulocytes: 0.07 10*3/uL (ref 0.00–0.07)
Basophils Absolute: 0 10*3/uL (ref 0.0–0.1)
Basophils Relative: 1 %
EOS ABS: 0.2 10*3/uL (ref 0.0–0.5)
Eosinophils Relative: 2 %
HCT: 36.3 % — ABNORMAL LOW (ref 39.0–52.0)
HEMOGLOBIN: 11.8 g/dL — AB (ref 13.0–17.0)
Immature Granulocytes: 1 %
LYMPHS ABS: 1.3 10*3/uL (ref 0.7–4.0)
Lymphocytes Relative: 15 %
MCH: 38.3 pg — ABNORMAL HIGH (ref 26.0–34.0)
MCHC: 32.5 g/dL (ref 30.0–36.0)
MCV: 117.9 fL — ABNORMAL HIGH (ref 80.0–100.0)
Monocytes Absolute: 0.6 10*3/uL (ref 0.1–1.0)
Monocytes Relative: 7 %
Neutro Abs: 6.5 10*3/uL (ref 1.7–7.7)
Neutrophils Relative %: 74 %
Platelets: 93 10*3/uL — ABNORMAL LOW (ref 150–400)
RBC: 3.08 MIL/uL — ABNORMAL LOW (ref 4.22–5.81)
RDW: 18.8 % — ABNORMAL HIGH (ref 11.5–15.5)
WBC: 8.6 10*3/uL (ref 4.0–10.5)
nRBC: 0 % (ref 0.0–0.2)

## 2018-03-12 LAB — LACTATE DEHYDROGENASE: LDH: 176 U/L (ref 98–192)

## 2018-03-12 LAB — COMPREHENSIVE METABOLIC PANEL
ALT: 13 U/L (ref 0–44)
ANION GAP: 7 (ref 5–15)
AST: 22 U/L (ref 15–41)
Albumin: 2.6 g/dL — ABNORMAL LOW (ref 3.5–5.0)
Alkaline Phosphatase: 72 U/L (ref 38–126)
BUN: 8 mg/dL (ref 6–20)
CO2: 26 mmol/L (ref 22–32)
Calcium: 7.9 mg/dL — ABNORMAL LOW (ref 8.9–10.3)
Chloride: 108 mmol/L (ref 98–111)
Creatinine, Ser: 0.93 mg/dL (ref 0.61–1.24)
GFR calc Af Amer: >60 mL/min (ref >60)
GFR calc non Af Amer: >60 mL/min (ref >60)
Glucose, Bld: 121 mg/dL — ABNORMAL HIGH (ref 70–99)
Potassium: 3.9 mmol/L (ref 3.5–5.1)
Sodium: 141 mmol/L (ref 135–145)
Total Bilirubin: 0.4 mg/dL (ref 0.3–1.2)
Total Protein: 5.8 g/dL — ABNORMAL LOW (ref 6.5–8.1)

## 2018-03-12 MED ORDER — FUROSEMIDE 20 MG PO TABS: 10.0000 mg | ORAL_TABLET | Freq: Every day | ORAL | 1 refills | Status: AC | PRN

## 2018-03-12 MED ORDER — CYANOCOBALAMIN 1000 MCG/ML IJ SOLN
INTRAMUSCULAR | Status: AC
  Filled 2018-03-12: qty 1

## 2018-03-12 MED ORDER — CYANOCOBALAMIN 1000 MCG/ML IJ SOLN
1000.0000 ug | Freq: Once | INTRAMUSCULAR | Status: AC
  Administered 2018-03-12: 1000 ug via INTRAMUSCULAR

## 2018-03-12 MED ORDER — CYANOCOBALAMIN 1000 MCG PO TABS: 1000.0000 ug | ORAL_TABLET | Freq: Every day | ORAL | 6 refills | Status: AC

## 2018-03-12 NOTE — Patient Instructions (Signed)
[place] at [facility] Discharge Instructions  Please pick up your prescription at [facility] drug.   Thank you for choosing [place] at [facility] to provide your oncology and hematology care.  To afford each patient quality time with our provider, please arrive at least 15 minutes before your scheduled appointment time.   If you have a lab appointment with the [facility] please come in thru the  Main Entrance and check in at the main information desk  You need to re-schedule your appointment should you arrive 10 or more minutes late.  We strive to give you quality time with our providers, and arriving late affects you and other patients whose appointments are after yours.  Also, if you no show three or more times for appointments you may be dismissed from the clinic at the providers discretion.     Again, thank you for choosing [facility].  Our hope is that these requests will decrease the amount of time that you wait before being seen by our physicians.       _____________________________________________________________  Should you have questions after your visit to [facility], please contact our office at [phone] between the hours of 8:00 a.m. and 4:30 p.m.  Voicemails left after 4:00 p.m. will not be returned until the following business day.  For prescription refill requests, have your pharmacy contact our office and allow 72 hours.    Cancer Center Support Programs:   > Cancer Support Group  2nd Tuesday of the month 1pm-2pm, Journey Room

## 2018-03-12 NOTE — Progress Notes (Signed)
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    02/10/2015 Imaging    CT CAP-  Mildly reduced size of left abdominal tumor implants. No new tumor implants identified.    03/14/2015 Procedure    Port-a-cath placed by Dr. Arnoldo Morale    03/14/2016 Imaging    CT CAP- 1. Continued reduction in size of left abdominal peritoneal metastases. No new or progressive metastatic disease in the abdomen or pelvis. 2. Solitary new irregular 1.0 cm nodular opacity in the anterior right upper lobe, highly nonspecific, potentially inflammatory, however cannot exclude pulmonary malignancy (metastatic or primary lung carcinoma). New nonspecific mild subcarinal and right hilar lymphadenopathy, cannot exclude new nodal metastases. PET-CT could be useful for further characterization / biopsy planning, as clinically warranted. Alternatively, a follow-up chest CT with IV contrast could be obtained in 3 months.    07/03/2016 Imaging    Ct chest- No findings specific for metastatic disease in the chest.  Small thoracic lymph nodes measuring up to 9 mm short axis, decreased.  No suspicious pulmonary nodules.      INTERVAL HISTORY:  [name] 57 y.o. male returns for routine follow-up for Stage IV GIST of small intestine. He is here today and doing well. He started taking his Gleevec back 1 tab. He is doing well no side effects. He has increased bilateral lower leg swelling. Denies any nausea, vomiting,  or diarrhea. Denies any new pains. Had not noticed any recent bleeding such as epistaxis, hematuria or hematochezia. Denies recent chest pain on exertion, shortness of breath on minimal exertion, pre-syncopal episodes, or palpitations. Denies any numbness or tingling in hands or feet. Denies any recent fevers, infections, or recent hospitalizations. Patient reports appetite at 100% and energy level at 75%.   REVIEW OF SYSTEMS:  Review of Systems  Cardiovascular: Positive for leg swelling.  Gastrointestinal: Positive for abdominal pain.  All other systems reviewed and are negative.    PAST MEDICAL/SURGICAL HISTORY:  Past Medical History:  Diagnosis Date  . Alcohol abuse   . Anemia   . Anxiety 09/04/2012  . GERD (gastroesophageal reflux disease)   . GIST (gastrointestinal stroma tumor), malignant, colon ([place])   . Hx of bipolar disorder   . Malignant GIST ([facility]) 09/23/2014   initially diagnosed in 2014  . Pancreatitis   . PE (physical exam), annual   . Pulmonary nodule 05/29/2016  . Tobacco abuse   . Ventral hernia    Past Surgical History:  Procedure Laterality Date  . BIOPSY  04/29/2017   Procedure: BIOPSY;  Surgeon: Daneil Dolin, MD;  [facility];  Service: Endoscopy;;  gastric and esophageal biopsy  . COLONOSCOPY WITH PROPOFOL [name] 04/29/2017   Procedure: COLONOSCOPY WITH PROPOFOL;  Surgeon: Daneil Dolin, MD;  [facility];  Service: Endoscopy;  Laterality: [name];  11:00am  . ESOPHAGOGASTRODUODENOSCOPY (EGD) WITH PROPOFOL [name] 04/29/2017   Procedure: ESOPHAGOGASTRODUODENOSCOPY (EGD) WITH PROPOFOL;  Surgeon: Daneil Dolin, MD;  [facility]: [facility];  Service: Endoscopy;  Laterality: [name];  . [place] [name] 04/29/2017   Procedure: Venia Minks DILATION;  Surgeon: Daneil Dolin, MD;  Location: [facility];  Service: Endoscopy;  Laterality: [name];  . PARTIAL COLECTOMY    . POLYPECTOMY  04/29/2017   Procedure: POLYPECTOMY;  Surgeon:  Daneil Dolin, MD;  [facility];  Service: Endoscopy;;  hepatic flexure polyp  . PORTACATH PLACEMENT Right 03/14/2015   Procedure: INSERTION PORT-A-CATH;  Surgeon: Aviva Signs, MD;  Location: [facility];  Service: General;  Laterality: Right;  . SMALL INTESTINE SURGERY     small bowel resection for obstruction     SOCIAL HISTORY:  Social History   Socioeconomic History  . Marital status: Single    Spouse name: Not on file  . Number of children: Not on file  . Years of education: Not on file  . Highest education level: Not on file  Occupational History  . Not on file  Social Needs  . Financial resource strain: Not on file  . Food insecurity:    Worry: Not on file    Inability: Not on file  . Transportation needs:    Medical: Not on file    Non-medical: Not on file  Tobacco Use  . Smoking status: Current Every Day Smoker    Packs/day: 0.50    Years: 36.00    Pack years: 18.00    Types: Cigarettes  . Smokeless tobacco: Never Used  . Tobacco comment: refused smoking cessation materials.   Substance and Sexual Activity  . Alcohol use: Yes    Alcohol/week: 24.0 standard drinks    Types: 24 Cans of beer per week    Comment: 2 - 3 cans beer daily  . Drug use: Yes    Types: Cocaine    Comment: denied 03/29/17, none in 6-7 months. "occasional"   . Sexual activity: Not Currently  Lifestyle  . Physical activity:    Days per week: Not on file    Minutes per session: Not on file  . Stress: Not on file  Relationships  . Social connections:    Talks on phone: Not on file    Gets together: Not on file    Attends religious service: Not on file    Active member of club or organization: Not on file    Attends meetings of clubs or organizations: Not on file    Relationship status: Not on file  . Intimate partner violence:    Fear of current or ex partner: Not on file    Emotionally abused: Not on file    Physically abused: Not on file    Forced sexual activity: Not on  file  Other Topics Concern  . Not on file  Social History Narrative  . Not on file    FAMILY HISTORY:  Family History  Problem Relation Age of Onset  . Cancer Mother   . Hypertension Father   . Cancer Brother   . Colon cancer Neg Hx     CURRENT MEDICATIONS:  Outpatient Encounter Medications as of 03/12/2018  Medication Sig  . BELBUCA 300 MCG FILM DISSOLVE 1 FILM UNDER THE TONGUE EVERY TWELVE HOURS FOR CHRONIC PAIN  . busPIRone (BUSPAR) 15 MG tablet Take by mouth.  . Calcium Carbonate (CALCIUM 600 PO) Take by mouth daily.  . Cholecalciferol (VITAMIN D3) 50 MCG (2000 UT) capsule Take 2,000 Units by mouth daily.  Marland Kitchen esomeprazole (NEXIUM) 20 MG capsule Take 20 mg by mouth  2 (two) times daily.  Marland Kitchen imatinib (GLEEVEC) 400 MG tablet Take 1 tablet (400 mg total) by mouth 2 (two) times daily.  Marland Kitchen lidocaine-prilocaine (EMLA) cream Apply 1 application topically as needed.  . Magnesium 250 MG TABS Take 250 mg by mouth 2 (two) times daily.  Marland Kitchen MAGNESIUM-OXIDE 400 (241.3 Mg) MG tablet Take 1 tablet by mouth 2 (two) times daily.  . methocarbamol (ROBAXIN) 750 MG tablet Take 750 mg by mouth every 8 (eight) hours as needed.  . mirtazapine (REMERON) 45 MG tablet Take by mouth.  . potassium chloride SA (K-DUR,KLOR-CON) 20 MEQ tablet Take 3 tablets (60 mEq total) by mouth daily.  . pregabalin (LYRICA) 150 MG capsule Take 150 mg by mouth 3 (three) times daily.  . traZODone (DESYREL) 50 MG tablet Take 50 mg by mouth at bedtime.  . cyanocobalamin 1000 MCG tablet Take 1 tablet (1,000 mcg total) by mouth daily.  . furosemide (LASIX) 20 MG tablet Take 0.5 tablets (10 mg total) by mouth daily as needed.  . [DISCONTINUED] BELBUCA 75 MCG FILM Increased to 375mcg  . [EXPIRED] cyanocobalamin ((VITAMIN B-12)) injection 1,000 mcg    No facility-administered encounter medications on file as of 03/12/2018.     ALLERGIES:  No Known Allergies   PHYSICAL EXAM:  ECOG Performance status: 1  Vitals:   03/12/18 0900   BP: (!) 82/56  Pulse: 95  Resp: 18  Temp: 97.9 F (36.6 C)  SpO2: 100%   Filed Weights   03/12/18 0900  Weight: 199 lb 5 oz (90.4 kg)    Physical Exam Constitutional:      Appearance: Normal appearance. He is normal weight.  Musculoskeletal: Normal range of motion.  Skin:    General: Skin is warm and dry.  Neurological:     Mental Status: He is alert and oriented to person, place, and time. Mental status is at baseline.  Psychiatric:        Mood and Affect: Mood normal.        Behavior: Behavior normal.        Thought Content: Thought content normal.        Judgment: Judgment normal.   Chest: Bilateral clear to auscultation.  CVS: S1-S2 regular rate and rhythm. Extremities: 1+ edema.  LABORATORY DATA:  I have reviewed the labs as listed.  CBC    Component Value Date/Time   WBC 8.6 03/12/2018 0905   RBC 3.08 (L) 03/12/2018 0905   HGB 11.8 (L) 03/12/2018 0905   HCT 36.3 (L) 03/12/2018 0905   PLT 93 (L) 03/12/2018 0905   MCV 117.9 (H) 03/12/2018 0905   MCH 38.3 (H) 03/12/2018 0905   MCHC 32.5 03/12/2018 0905   RDW 18.8 (H) 03/12/2018 0905   LYMPHSABS 1.3 03/12/2018 0905   MONOABS 0.6 03/12/2018 0905   EOSABS 0.2 03/12/2018 0905   BASOSABS 0.0 03/12/2018 0905   CMP Latest Ref Rng & Units 03/12/2018 03/05/2018 02/26/2018  Glucose 70 - 99 mg/dL 121(H) 247(H) 102(H)  BUN 6 - 20 mg/dL 8 <5(L) 8  Creatinine 0.61 - 1.24 mg/dL 0.93 0.79 0.87  Sodium 135 - 145 mmol/L 141 138 135  Potassium 3.5 - 5.1 mmol/L 3.9 4.4 3.4(L)  Chloride 98 - 111 mmol/L 108 104 102  CO2 22 - 32 mmol/L 26 26 26   Calcium 8.9 - 10.3 mg/dL 7.9(L) 8.1(L) 7.6(L)  Total Protein 6.5 - 8.1 g/dL 5.8(L) 6.4(L) 5.8(L)  Total Bilirubin 0.3 - 1.2 mg/dL 0.4 0.2(L) 0.4  Alkaline Phos 38 - 126  U/L 72 87 67  AST 15 - 41 U/L 22 34 22  ALT 0 - 44 U/L 13 19 12        DIAGNOSTIC IMAGING:  I have independently reviewed the scans and discussed with the patient.   I have reviewed Francene Finders, NP's note and  agree with the documentation.  I personally performed a face-to-face visit, made revisions and my assessment and plan is as follows.    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  Stage IV GIST: - Diagnosed in 2014, status post partial small bowel resection on 09/04/2012 with recurrence of disease with extensive peritoneal disease. -he was started on [place] in March 2015. - He is tolerating Gleevec 400 mg daily very well.  He occasionally gets nauseous. - Denies any abdominal pains or bloating.  Denies any bowel habit changes.  Denies any bleeding per rectum or melena. - We reviewed the results of the CT abdomen pelvis dated 01/29/2018 which shows 2 cavitary soft tissue nodules in the left abdomen concerning for progressive disease.  Larger of the 2 lesions is contiguous with the descending colon and is gas-filled, with possible communication with the colonic lumen..  Measures 3.3 cm is contiguous with adjacent small bowel and is at the location of 15 mm nodule on prior scan from October 2018.  The smaller of the 2 lesions is more proximal along the descending colon measuring 1.2 x 1.7 cm. - We talked about starting him on Sutent 50 mg 4 weeks on 2 weeks off versus increasing imatinib to 800 mg daily.  As he was tolerating imatinib very well, we have plan to increase imatinib. -However at last visit his CBC showed decreased platelet count of 37.  We have held his imatinib. -His platelet count improved to 120 on 03/05/2018.  We have started him back on 1 tablet daily. -His platelet count today is 93.  White count is normal.  We will continue the same dose of Gleevec. -We will increase it to 400 mg twice daily at next visit in 2 weeks if his platelet count is stable or improves. -He has mild edema in the lower extremities.  I will start him on low-dose Lasix 10 mg daily as needed. -His B12 was also low at 80.  We have given a B12 injection today  and sent a prescription for B12 1 mg daily. -Thrombocytopenia could be secondary to bone marrow suppression from [place] versus B12 deficiency.  2.  Polysubstance abuse: -Reading through the chart, I see documentation of history of EtOH and cocaine abuse with urine drug screen positive for cocaine and negative for opioids on multiple occasions. -he is on buprenorphine buccal film. - He was told to be more compliant with his appointments.      Orders placed this encounter:  Orders Placed This Encounter  Procedures  . Lactate dehydrogenase  . CBC with Differential/Platelet  . Comprehensive metabolic panel      Derek Jack, MD [place] [phone]

## 2018-03-12 NOTE — Progress Notes (Signed)
Pt here today for B12 injection. Pt given injection in left deltoid. Pt tolerated injection well with no complaints. Pt stable and discharged home via wheelchair. Pt to return as scheduled

## 2018-03-12 NOTE — Assessment & Plan Note (Signed)
1.  Stage IV GIST: - Diagnosed in 2014, status post partial small bowel resection on 09/04/2012 with recurrence of disease with extensive peritoneal disease. -he was started on [place] in March 2015. - He is tolerating Gleevec 400 mg daily very well.  He occasionally gets nauseous. - Denies any abdominal pains or bloating.  Denies any bowel habit changes.  Denies any bleeding per rectum or melena. - We reviewed the results of the CT abdomen pelvis dated 01/29/2018 which shows 2 cavitary soft tissue nodules in the left abdomen concerning for progressive disease.  Larger of the 2 lesions is contiguous with the descending colon and is gas-filled, with possible communication with the colonic lumen..  Measures 3.3 cm is contiguous with adjacent small bowel and is at the location of 15 mm nodule on prior scan from October 2018.  The smaller of the 2 lesions is more proximal along the descending colon measuring 1.2 x 1.7 cm. - We talked about starting him on Sutent 50 mg 4 weeks on 2 weeks off versus increasing imatinib to 800 mg daily.  As he was tolerating imatinib very well, we have plan to increase imatinib. -However at last visit his CBC showed decreased platelet count of 37.  We have held his imatinib. -His platelet count improved to 120 on 03/05/2018.  We have started him back on 1 tablet daily. -His platelet count today is 93.  White count is normal.  We will continue the same dose of Gleevec. -We will increase it to 400 mg twice daily at next visit in 2 weeks if his platelet count is stable or improves. -He has mild edema in the lower extremities.  I will start him on low-dose Lasix 10 mg daily as needed. -His B12 was also low at 80.  We have given a B12 injection today and sent a prescription for B12 1 mg daily. -Thrombocytopenia could be secondary to bone marrow suppression from [place] versus B12 deficiency.  2.  Polysubstance abuse: -Reading through the chart, I see documentation of history of  EtOH and cocaine abuse with urine drug screen positive for cocaine and negative for opioids on multiple occasions. -he is on buprenorphine buccal film. - He was told to be more compliant with his appointments.

## 2018-03-14 ENCOUNTER — Other Ambulatory Visit (HOSPITAL_COMMUNITY): Payer: Medicaid Other

## 2018-03-14 ENCOUNTER — Ambulatory Visit (HOSPITAL_COMMUNITY): Payer: Medicaid Other | Admitting: Hematology

## 2018-03-26 ENCOUNTER — Inpatient Hospital Stay (HOSPITAL_COMMUNITY): Payer: Medicaid Other

## 2018-03-26 ENCOUNTER — Inpatient Hospital Stay (HOSPITAL_COMMUNITY): Payer: Medicaid Other | Attending: Hematology | Admitting: Hematology

## 2018-03-26 ENCOUNTER — Encounter (HOSPITAL_COMMUNITY): Payer: Self-pay | Admitting: Hematology

## 2018-03-26 VITALS — BP 118/64 | HR 87 | Temp 98.7°F | Resp 18 | Wt 190.0 lb

## 2018-03-26 DIAGNOSIS — F1020 Alcohol dependence, uncomplicated: Secondary | ICD-10-CM | POA: Insufficient documentation

## 2018-03-26 DIAGNOSIS — G893 Neoplasm related pain (acute) (chronic): Secondary | ICD-10-CM | POA: Diagnosis not present

## 2018-03-26 DIAGNOSIS — C786 Secondary malignant neoplasm of retroperitoneum and peritoneum: Secondary | ICD-10-CM | POA: Insufficient documentation

## 2018-03-26 DIAGNOSIS — C49A3 Gastrointestinal stromal tumor of small intestine: Secondary | ICD-10-CM

## 2018-03-26 DIAGNOSIS — F17210 Nicotine dependence, cigarettes, uncomplicated: Secondary | ICD-10-CM | POA: Insufficient documentation

## 2018-03-26 DIAGNOSIS — R110 Nausea: Secondary | ICD-10-CM | POA: Diagnosis not present

## 2018-03-26 DIAGNOSIS — Z79899 Other long term (current) drug therapy: Secondary | ICD-10-CM | POA: Insufficient documentation

## 2018-03-26 LAB — COMPREHENSIVE METABOLIC PANEL WITH GFR
ALT: 23 U/L (ref 0–44)
AST: 34 U/L (ref 15–41)
Albumin: 3.1 g/dL — ABNORMAL LOW (ref 3.5–5.0)
Alkaline Phosphatase: 84 U/L (ref 38–126)
Anion gap: 10 (ref 5–15)
BUN: 5 mg/dL — ABNORMAL LOW (ref 6–20)
CO2: 24 mmol/L (ref 22–32)
Calcium: 8.4 mg/dL — ABNORMAL LOW (ref 8.9–10.3)
Chloride: 102 mmol/L (ref 98–111)
Creatinine, Ser: 0.98 mg/dL (ref 0.61–1.24)
GFR calc Af Amer: >60 mL/min
GFR calc non Af Amer: >60 mL/min
Glucose, Bld: 129 mg/dL — ABNORMAL HIGH (ref 70–99)
Potassium: 3.5 mmol/L (ref 3.5–5.1)
Sodium: 136 mmol/L (ref 135–145)
Total Bilirubin: 0.5 mg/dL (ref 0.3–1.2)
Total Protein: 6.7 g/dL (ref 6.5–8.1)

## 2018-03-26 LAB — CBC WITH DIFFERENTIAL/PLATELET
Abs Immature Granulocytes: 0.02 10*3/uL (ref 0.00–0.07)
Basophils Absolute: 0 10*3/uL (ref 0.0–0.1)
Basophils Relative: 0 %
Eosinophils Absolute: 0.2 10*3/uL (ref 0.0–0.5)
Eosinophils Relative: 3 %
HCT: 36.9 % — ABNORMAL LOW (ref 39.0–52.0)
Hemoglobin: 12.2 g/dL — ABNORMAL LOW (ref 13.0–17.0)
Immature Granulocytes: 0 %
Lymphocytes Relative: 14 %
Lymphs Abs: 1 10*3/uL (ref 0.7–4.0)
MCH: 37.7 pg — ABNORMAL HIGH (ref 26.0–34.0)
MCHC: 33.1 g/dL (ref 30.0–36.0)
MCV: 113.9 fL — ABNORMAL HIGH (ref 80.0–100.0)
Monocytes Absolute: 0.5 10*3/uL (ref 0.1–1.0)
Monocytes Relative: 7 %
Neutro Abs: 5.1 10*3/uL (ref 1.7–7.7)
Neutrophils Relative %: 76 %
Platelets: 93 10*3/uL — ABNORMAL LOW (ref 150–400)
RBC: 3.24 MIL/uL — ABNORMAL LOW (ref 4.22–5.81)
RDW: 16 % — ABNORMAL HIGH (ref 11.5–15.5)
WBC: 6.8 10*3/uL (ref 4.0–10.5)
nRBC: 0 % (ref 0.0–0.2)

## 2018-03-26 LAB — LACTATE DEHYDROGENASE: LDH: 179 U/L (ref 98–192)

## 2018-03-26 NOTE — Patient Instructions (Signed)
[facility] Discharge Instructions     Thank you for choosing [facility] at [facility] to provide your oncology and hematology care.  To afford each patient quality time with our provider, please arrive at least 15 minutes before your scheduled appointment time.   If you have a lab appointment with the Cancer Center please come in thru the  Main Entrance and check in at the main information desk  You need to re-schedule your appointment should you arrive 10 or more minutes late.  We strive to give you quality time with our providers, and arriving late affects you and other patients whose appointments are after yours.  Also, if you no show three or more times for appointments you may be dismissed from the clinic at the providers discretion.     Again, thank you for choosing [facility].  Our hope is that these requests will decrease the amount of time that you wait before being seen by our physicians.       _____________________________________________________________  Should you have questions after your visit to [facility], please contact our office at [phone] between the hours of 8:00 a.m. and 4:30 p.m.  Voicemails left after 4:00 p.m. will not be returned until the following business day.  For prescription refill requests, have your pharmacy contact our office and allow 72 hours.    Cancer Center Support Programs:   > Cancer Support Group  2nd Tuesday of the month 1pm-2pm, Journey Room    

## 2018-03-26 NOTE — Assessment & Plan Note (Signed)
1.  Stage IV GIST: - Diagnosed in 2014, status post partial small bowel resection on 09/04/2012 with recurrence of disease with extensive peritoneal disease. -he was started on [place] in March 2015. - He is tolerating Gleevec 400 mg daily very well.  He occasionally gets nauseous. - Denies any abdominal pains or bloating.  Denies any bowel habit changes.  Denies any bleeding per rectum or melena. - We reviewed the results of the CT abdomen pelvis dated 01/29/2018 which shows 2 cavitary soft tissue nodules in the left abdomen concerning for progressive disease.  Larger of the 2 lesions is contiguous with the descending colon and is gas-filled, with possible communication with the colonic lumen..  Measures 3.3 cm is contiguous with adjacent small bowel and is at the location of 15 mm nodule on prior scan from October 2018.  The smaller of the 2 lesions is more proximal along the descending colon measuring 1.2 x 1.7 cm. - We talked about starting him on Sutent 50 mg 4 weeks on 2 weeks off versus increasing imatinib to 800 mg daily.  As he was tolerating imatinib very well, we have plan to increase imatinib. -However at last visit his CBC showed decreased platelet count of 37.  We have held his imatinib. -He was started back on Gleevec 1 tablet daily on 03/05/2018 when his platelet count improved to 120.  - He is tolerating Gleevec 1 tablet daily very well.  His platelet count is stable at 93 for the last 2 weeks. - We will increase his Gleevec to 1 tablet twice daily.  I plan to repeat his blood work in 2 weeks. - We will continue Gleevec as long as his platelet count stays above 50. - He is continuing to take B12 tablet daily.  His hemoglobin improved to 12.2. -He was reportedly started on duloxetine 30 mg by his PMD.  2.  Polysubstance abuse: -Reading through the chart, I see documentation of history of EtOH and cocaine abuse with urine drug screen positive for cocaine and negative for opioids on  multiple occasions. -he is on buprenorphine buccal film. - He was told to be more compliant with his appointments.

## 2018-03-26 NOTE — Progress Notes (Signed)
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    02/10/2015 Imaging    CT CAP-  Mildly reduced size of left abdominal tumor implants. No new tumor implants identified.    03/14/2015 Procedure    Port-a-cath placed by Dr. Arnoldo Morale    03/14/2016 Imaging    CT CAP- 1. Continued reduction in size of left abdominal peritoneal metastases. No new or progressive metastatic disease in the abdomen or pelvis. 2. Solitary new irregular 1.0 cm nodular opacity in the anterior right upper lobe, highly nonspecific, potentially inflammatory, however cannot exclude pulmonary malignancy (metastatic or primary lung carcinoma). New nonspecific mild subcarinal and right hilar lymphadenopathy, cannot exclude new nodal metastases. PET-CT could be useful for further characterization / biopsy planning, as clinically warranted. Alternatively, a follow-up chest CT with IV contrast could be obtained in 3 months.    07/03/2016 Imaging    Ct chest- No findings specific for metastatic disease in the chest.  Small thoracic lymph nodes measuring up to 9 mm short axis, decreased.  No suspicious pulmonary nodules.      INTERVAL HISTORY:  [name] 58 y.o. male returns for routine follow-up for Stage IV GIST of small intestine. He is here and has done well since his last visit. He has tolerated going back on his Gleevac 1 pill a day well. He reports he has to have food with them or it causes nausea but otherwise  no issues. Denies any nausea, vomiting, or diarrhea. Denies any new pains. Had not noticed any recent bleeding such as epistaxis, hematuria or hematochezia. Denies recent chest pain on exertion, shortness of breath on minimal exertion, pre-syncopal episodes, or palpitations. Denies any numbness or tingling in hands or feet. Denies any recent fevers, infections, or recent hospitalizations. Patient reports appetite at 100% and energy level at 75%.   REVIEW OF SYSTEMS:  Review of Systems  All other systems reviewed and are negative.    PAST MEDICAL/SURGICAL HISTORY:  Past Medical History:  Diagnosis Date  . Alcohol abuse   . Anemia   . Anxiety 09/04/2012  . GERD (gastroesophageal reflux disease)   . GIST (gastrointestinal stroma tumor), malignant, colon ([facility])   . Hx of bipolar disorder   . Malignant GIST ([facility]) 09/23/2014   initially diagnosed in 2014  . Pancreatitis   . PE (physical exam), annual   . Pulmonary nodule 05/29/2016  . Tobacco abuse   . Ventral hernia    Past Surgical History:  Procedure Laterality Date  . BIOPSY  04/29/2017   Procedure: BIOPSY;  Surgeon: Daneil Dolin, MD;  [facility];  Service: Endoscopy;;  gastric and esophageal biopsy  . COLONOSCOPY WITH PROPOFOL [name] 04/29/2017   Procedure: COLONOSCOPY WITH PROPOFOL;  Surgeon: Daneil Dolin, MD;  Location: [facility];  Service: Endoscopy;  Laterality: [name];  11:00am  . ESOPHAGOGASTRODUODENOSCOPY (EGD) WITH PROPOFOL [name] 04/29/2017   Procedure: ESOPHAGOGASTRODUODENOSCOPY (EGD) WITH PROPOFOL;  Surgeon: Daneil Dolin, MD;  Location: [facility];  Service: Endoscopy;  Laterality: [name];  . [place] [name] 04/29/2017   Procedure: Venia Minks DILATION;  Surgeon: Daneil Dolin, MD;  Location: [facility];  Service: Endoscopy;  Laterality: [name];  . PARTIAL COLECTOMY    . POLYPECTOMY  04/29/2017   Procedure: POLYPECTOMY;  Surgeon: Daneil Dolin, MD;  [facility]: [facility];  Service:  Endoscopy;;  hepatic flexure polyp  . PORTACATH PLACEMENT Right 03/14/2015   Procedure: INSERTION PORT-A-CATH;  Surgeon: Aviva Signs, MD;  Location: [facility];  Service: General;  Laterality: Right;  . SMALL INTESTINE SURGERY     small bowel resection for obstruction     SOCIAL HISTORY:  Social History   Socioeconomic History  . Marital status: Single    Spouse name: Not on file  . Number of children: Not on file  . Years of education: Not on file  . Highest education level: Not on file  Occupational History  . Not on file  Social Needs  . Financial resource strain: Not on file  . Food insecurity:    Worry: Not on file    Inability: Not on file  . Transportation needs:    Medical: Not on file    Non-medical: Not on file  Tobacco Use  . Smoking status: Current Every Day Smoker    Packs/day: 0.50    Years: 36.00    Pack years: 18.00    Types: Cigarettes  . Smokeless tobacco: Never Used  . Tobacco comment: refused smoking cessation materials.   Substance and Sexual Activity  . Alcohol use: Yes    Alcohol/week: 24.0 standard drinks    Types: 24 Cans of beer per week    Comment: 2 - 3 cans beer daily  . Drug use: Yes    Types: Cocaine    Comment: denied 03/29/17, none in 6-7 months. "occasional"   . Sexual activity: Not Currently  Lifestyle  . Physical activity:    Days per week: Not on file    Minutes per session: Not on file  . Stress: Not on file  Relationships  . Social connections:    Talks on phone: Not on file    Gets together: Not on file    Attends religious service: Not on file    Active member of club or organization: Not on file    Attends meetings of clubs or organizations: Not on file    Relationship status: Not on file  . Intimate partner violence:    Fear of current or ex partner: Not on file    Emotionally abused: Not on file    Physically abused: Not on file    Forced sexual activity: Not on file  Other Topics Concern  . Not on file  Social  History Narrative  . Not on file    FAMILY HISTORY:  Family History  Problem Relation Age of Onset  . Cancer Mother   . Hypertension Father   . Cancer Brother   . Colon cancer Neg Hx     CURRENT MEDICATIONS:  Outpatient Encounter Medications as of 03/26/2018  Medication Sig  . BELBUCA 300 MCG FILM DISSOLVE 1 FILM UNDER THE TONGUE EVERY TWELVE HOURS FOR CHRONIC PAIN  . busPIRone (BUSPAR) 15 MG tablet Take by mouth.  . Calcium Carbonate (CALCIUM 600 PO) Take by mouth daily.  . Cholecalciferol (VITAMIN D3) 50 MCG (2000 UT) capsule Take 2,000 Units by mouth daily.  . cyanocobalamin 1000 MCG tablet Take 1 tablet (1,000 mcg total)  by mouth daily.  . DULoxetine (CYMBALTA) 30 MG capsule Take 30 mg by mouth daily.  Marland Kitchen esomeprazole (NEXIUM) 20 MG capsule Take 20 mg by mouth 2 (two) times daily.  Marland Kitchen imatinib (GLEEVEC) 400 MG tablet Take 1 tablet (400 mg total) by mouth 2 (two) times daily.  . Magnesium 250 MG TABS Take 250 mg by mouth 2 (two) times daily.  . methocarbamol (ROBAXIN) 750 MG tablet Take 750 mg by mouth every 8 (eight) hours as needed.  . mirtazapine (REMERON) 45 MG tablet Take by mouth.  . potassium chloride SA (K-DUR,KLOR-CON) 20 MEQ tablet Take 3 tablets (60 mEq total) by mouth daily.  . pregabalin (LYRICA) 150 MG capsule Take 150 mg by mouth 3 (three) times daily.  . traZODone (DESYREL) 50 MG tablet Take 50 mg by mouth at bedtime.  . [DISCONTINUED] MAGNESIUM-OXIDE 400 (241.3 Mg) MG tablet Take 1 tablet by mouth 2 (two) times daily.  . furosemide (LASIX) 20 MG tablet Take 0.5 tablets (10 mg total) by mouth daily as needed. (Patient not taking: Reported on 03/26/2018)  . lidocaine-prilocaine (EMLA) cream Apply 1 application topically as needed. (Patient not taking: Reported on 03/26/2018)   No facility-administered encounter medications on file as of 03/26/2018.     ALLERGIES:  No Known Allergies   PHYSICAL EXAM:  ECOG Performance status: 1  Vitals:   03/26/18 1040  BP:  118/64  Pulse: 87  Resp: 18  Temp: 98.7 F (37.1 C)  SpO2: 98%   Filed Weights   03/26/18 1040  Weight: 190 lb (86.2 kg)    Physical Exam Constitutional:      Appearance: Normal appearance. He is normal weight.  Cardiovascular:     Rate and Rhythm: Normal rate and regular rhythm.     Heart sounds: Normal heart sounds.  Pulmonary:     Effort: Pulmonary effort is normal.     Breath sounds: Normal breath sounds.  Musculoskeletal: Normal range of motion.  Skin:    General: Skin is warm and dry.  Neurological:     Mental Status: He is alert and oriented to person, place, and time. Mental status is at baseline.  Psychiatric:        Mood and Affect: Mood normal.        Behavior: Behavior normal.        Thought Content: Thought content normal.        Judgment: Judgment normal.      LABORATORY DATA:  I have reviewed the labs as listed.  CBC    Component Value Date/Time   WBC 6.8 03/26/2018 1004   RBC 3.24 (L) 03/26/2018 1004   HGB 12.2 (L) 03/26/2018 1004   HCT 36.9 (L) 03/26/2018 1004   PLT 93 (L) 03/26/2018 1004   MCV 113.9 (H) 03/26/2018 1004   MCH 37.7 (H) 03/26/2018 1004   MCHC 33.1 03/26/2018 1004   RDW 16.0 (H) 03/26/2018 1004   LYMPHSABS 1.0 03/26/2018 1004   MONOABS 0.5 03/26/2018 1004   EOSABS 0.2 03/26/2018 1004   BASOSABS 0.0 03/26/2018 1004   CMP Latest Ref Rng & Units 03/26/2018 03/12/2018 03/05/2018  Glucose 70 - 99 mg/dL 129(H) 121(H) 247(H)  BUN 6 - 20 mg/dL 5(L) 8 <5(L)  Creatinine 0.61 - 1.24 mg/dL 0.98 0.93 0.79  Sodium 135 - 145 mmol/L 136 141 138  Potassium 3.5 - 5.1 mmol/L 3.5 3.9 4.4  Chloride 98 - 111 mmol/L 102 108 104  CO2 22 - 32 mmol/L 24 26 26   Calcium  8.9 - 10.3 mg/dL 8.4(L) 7.9(L) 8.1(L)  Total Protein 6.5 - 8.1 g/dL 6.7 5.8(L) 6.4(L)  Total Bilirubin 0.3 - 1.2 mg/dL 0.5 0.4 0.2(L)  Alkaline Phos 38 - 126 U/L 84 72 87  AST 15 - 41 U/L 34 22 34  ALT 0 - 44 U/L 23 13 19        DIAGNOSTIC IMAGING:  I have independently reviewed  the scans and discussed with the patient.   I have reviewed Francene Finders, NP's note and agree with the documentation.  I personally performed a face-to-face visit, made revisions and my assessment and plan is as follows.    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  Stage IV GIST: - Diagnosed in 2014, status post partial small bowel resection on 09/04/2012 with recurrence of disease with extensive peritoneal disease. -he was started on Angie in March 2015. - He is tolerating Gleevec 400 mg daily very well.  He occasionally gets nauseous. - Denies any abdominal pains or bloating.  Denies any bowel habit changes.  Denies any bleeding per rectum or melena. - We reviewed the results of the CT abdomen pelvis dated 01/29/2018 which shows 2 cavitary soft tissue nodules in the left abdomen concerning for progressive disease.  Larger of the 2 lesions is contiguous with the descending colon and is gas-filled, with possible communication with the colonic lumen..  Measures 3.3 cm is contiguous with adjacent small bowel and is at the location of 15 mm nodule on prior scan from October 2018.  The smaller of the 2 lesions is more proximal along the descending colon measuring 1.2 x 1.7 cm. - We talked about starting him on Sutent 50 mg 4 weeks on 2 weeks off versus increasing imatinib to 800 mg daily.  As he was tolerating imatinib very well, we have plan to increase imatinib. -However at last visit his CBC showed decreased platelet count of 37.  We have held his imatinib. -He was started back on Gleevec 1 tablet daily on 03/05/2018 when his platelet count improved to 120.  - He is tolerating Gleevec 1 tablet daily very well.  His platelet count is stable at 93 for the last 2 weeks. - We will increase his Gleevec to 1 tablet twice daily.  I plan to repeat his blood work in 2 weeks. - We will continue Gleevec as long as his platelet count stays above 50. -  He is continuing to take B12 tablet daily.  His hemoglobin improved to 12.2. -He was reportedly started on duloxetine 30 mg by his PMD.  2.  Polysubstance abuse: -Reading through the chart, I see documentation of history of EtOH and cocaine abuse with urine drug screen positive for cocaine and negative for opioids on multiple occasions. -he is on buprenorphine buccal film. - He was told to be more compliant with his appointments.      Orders placed this encounter:  Orders Placed This Encounter  Procedures  . Lactate dehydrogenase  . CBC with Differential/Platelet  . Comprehensive metabolic panel      Derek Jack, MD [place] [phone]

## 2018-04-09 ENCOUNTER — Other Ambulatory Visit (HOSPITAL_COMMUNITY): Payer: Medicaid Other

## 2018-04-09 ENCOUNTER — Ambulatory Visit (HOSPITAL_COMMUNITY): Payer: Medicaid Other | Admitting: Nurse Practitioner

## 2018-04-10 ENCOUNTER — Inpatient Hospital Stay (HOSPITAL_BASED_OUTPATIENT_CLINIC_OR_DEPARTMENT_OTHER): Payer: Medicaid Other | Admitting: Nurse Practitioner

## 2018-04-10 ENCOUNTER — Inpatient Hospital Stay (HOSPITAL_COMMUNITY): Payer: Medicaid Other

## 2018-04-10 ENCOUNTER — Encounter (HOSPITAL_COMMUNITY): Payer: Self-pay | Admitting: Nurse Practitioner

## 2018-04-10 VITALS — BP 113/78 | HR 94 | Temp 98.5°F | Resp 18 | Wt 191.0 lb

## 2018-04-10 DIAGNOSIS — Z79899 Other long term (current) drug therapy: Secondary | ICD-10-CM

## 2018-04-10 DIAGNOSIS — G893 Neoplasm related pain (acute) (chronic): Secondary | ICD-10-CM

## 2018-04-10 DIAGNOSIS — C786 Secondary malignant neoplasm of retroperitoneum and peritoneum: Secondary | ICD-10-CM | POA: Diagnosis not present

## 2018-04-10 DIAGNOSIS — C49A3 Gastrointestinal stromal tumor of small intestine: Secondary | ICD-10-CM

## 2018-04-10 DIAGNOSIS — R110 Nausea: Secondary | ICD-10-CM

## 2018-04-10 LAB — CBC WITH DIFFERENTIAL/PLATELET
Abs Immature Granulocytes: 0.02 10*3/uL (ref 0.00–0.07)
BASOS PCT: 1 %
Basophils Absolute: 0.1 10*3/uL (ref 0.0–0.1)
Eosinophils Absolute: 0.3 10*3/uL (ref 0.0–0.5)
Eosinophils Relative: 3 %
HCT: 39.5 % (ref 39.0–52.0)
Hemoglobin: 13.4 g/dL (ref 13.0–17.0)
Immature Granulocytes: 0 %
Lymphocytes Relative: 20 %
Lymphs Abs: 1.7 10*3/uL (ref 0.7–4.0)
MCH: 37.6 pg — ABNORMAL HIGH (ref 26.0–34.0)
MCHC: 33.9 g/dL (ref 30.0–36.0)
MCV: 111 fL — ABNORMAL HIGH (ref 80.0–100.0)
Monocytes Absolute: 0.5 10*3/uL (ref 0.1–1.0)
Monocytes Relative: 6 %
Neutro Abs: 5.8 10*3/uL (ref 1.7–7.7)
Neutrophils Relative %: 70 %
Platelets: 82 10*3/uL — ABNORMAL LOW (ref 150–400)
RBC: 3.56 MIL/uL — ABNORMAL LOW (ref 4.22–5.81)
RDW: 14.6 % (ref 11.5–15.5)
WBC: 8.3 10*3/uL (ref 4.0–10.5)
nRBC: 0 % (ref 0.0–0.2)

## 2018-04-10 LAB — COMPREHENSIVE METABOLIC PANEL
ALT: 19 U/L (ref 0–44)
AST: 35 U/L (ref 15–41)
Albumin: 3.2 g/dL — ABNORMAL LOW (ref 3.5–5.0)
Alkaline Phosphatase: 106 U/L (ref 38–126)
Anion gap: 10 (ref 5–15)
BUN: 6 mg/dL (ref 6–20)
CO2: 23 mmol/L (ref 22–32)
Calcium: 8.3 mg/dL — ABNORMAL LOW (ref 8.9–10.3)
Chloride: 104 mmol/L (ref 98–111)
Creatinine, Ser: 0.85 mg/dL (ref 0.61–1.24)
GFR calc Af Amer: >60 mL/min (ref >60)
Glucose, Bld: 129 mg/dL — ABNORMAL HIGH (ref 70–99)
Potassium: 3.6 mmol/L (ref 3.5–5.1)
Sodium: 137 mmol/L (ref 135–145)
Total Bilirubin: 0.6 mg/dL (ref 0.3–1.2)
Total Protein: 6.4 g/dL — ABNORMAL LOW (ref 6.5–8.1)

## 2018-04-10 LAB — LACTATE DEHYDROGENASE: LDH: 207 U/L — AB (ref 98–192)

## 2018-04-10 NOTE — Assessment & Plan Note (Signed)
1.  Stage IV GIST: - Diagnosed in 2014, status post partial small bowel resection on 09/04/2012 with recurrence of disease with extensive peritoneal disease. -he was started on [place] in March 2015. - He is tolerating Gleevec 400 mg daily very well.  He occasionally gets nauseous. - Denies any abdominal pains or bloating.  Denies any bowel habit changes.  Denies any bleeding per rectum or melena. - We reviewed the results of the CT abdomen pelvis dated 01/29/2018 which shows 2 cavitary soft tissue nodules in the left abdomen concerning for progressive disease.  Larger of the 2 lesions is contiguous with the descending colon and is gas-filled, with possible communication with the colonic lumen..  Measures 3.3 cm is contiguous with adjacent small bowel and is at the location of 15 mm nodule on prior scan from October 2018.  The smaller of the 2 lesions is more proximal along the descending colon measuring 1.2 x 1.7 cm. - We talked about starting him on Sutent 50 mg 4 weeks on 2 weeks off versus increasing imatinib to 800 mg daily.  As he was tolerating imatinib very well, we have plan to increase imatinib. -However at last visit his CBC showed decreased platelet count of 37.  We have held his imatinib. -He was started back on Gleevec 1 tablet daily on 03/05/2018 when his platelet count improved to 120.  - He is tolerating Gleevec 1 tablet daily very well.  His platelet count is stable at 93 for the last 2 weeks. - We will increase his Gleevec to 1 tablet twice daily.  I plan to repeat his blood work in 2 weeks. - We will continue Gleevec as long as his platelet count stays above 50. - He is continuing to take B12 tablet daily.  His hemoglobin improved to 12.2. -He was reportedly started on duloxetine 30 mg by his PMD. - He reports on 04/10/2018 taking his Gleevec 1 and 1/2 pills in the evening due to it causing nausea in the morning.  He was educated about the importance of taking them 12 hours apart with  food.  He has nausea medicine and will start taking 1pill in the AM 1pill in the PM. - We will see him back in 3 weeks with labs and a port flush.  2.  Polysubstance abuse: -Reading through the chart, I see documentation of history of EtOH and cocaine abuse with urine drug screen positive for cocaine and negative for opioids on multiple occasions. -he is on buprenorphine buccal film. - He was told to be more compliant with his appointments.

## 2018-04-10 NOTE — Progress Notes (Signed)
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    02/10/2015 Imaging    CT CAP-  Mildly reduced size of left abdominal tumor implants. No new tumor implants identified.    03/14/2015 Procedure    Port-a-cath placed by Dr. Arnoldo Morale    03/14/2016 Imaging    CT CAP- 1. Continued reduction in size of left abdominal peritoneal metastases. No new or progressive metastatic disease in the abdomen or pelvis. 2. Solitary new irregular 1.0 cm nodular opacity in the anterior right upper lobe, highly nonspecific, potentially inflammatory, however cannot exclude pulmonary malignancy (metastatic or primary lung carcinoma). New nonspecific mild subcarinal and right hilar lymphadenopathy, cannot exclude new nodal metastases. PET-CT could be useful for further characterization / biopsy planning, as clinically warranted. Alternatively, a follow-up chest CT with IV contrast could be obtained in 3 months.    07/03/2016 Imaging    Ct chest- No findings specific for metastatic disease in the chest.  Small thoracic lymph nodes measuring up to 9 mm short axis, decreased.  No suspicious pulmonary nodules.      INTERVAL HISTORY:  [name] 58 y.o. male returns for routine follow-up for Stage IV GIST of small intestine. He has been doing well since his last visit. He has occasional abdominal pains. He is unable to eat a lot during the day but eats a large meal in the evening. He is taking 1 and a half  pills in the evening instead of taking as prescribed. He reports if he takes a whole pill in the morning he gets nauseous. He was educated and he will go back to taking it once in the morning and once in the evening. He has nausea medication to help. He is maintaining his weight well. Denies any vomiting or diarrhea. Denies any new pains. Had not noticed any recent bleeding such as epistaxis, hematuria or hematochezia. Denies recent chest pain on exertion, shortness of breath on minimal exertion, pre-syncopal episodes, or palpitations. Denies any numbness or tingling in hands or feet. Denies any recent fevers, infections, or recent hospitalizations. Patient reports appetite at 75% and energy level at 50%.   REVIEW OF SYSTEMS:  Review of Systems  Gastrointestinal: Positive for abdominal pain and nausea.  All other systems reviewed and are negative.    PAST MEDICAL/SURGICAL HISTORY:  Past Medical History:  Diagnosis Date  . Alcohol abuse   . Anemia   . Anxiety 09/04/2012  . GERD (gastroesophageal reflux disease)   . GIST (gastrointestinal stroma tumor), malignant, colon ([facility])   . Hx of bipolar disorder   . Malignant GIST ([facility]) 09/23/2014   initially diagnosed in 2014  . Pancreatitis   . PE (physical exam), annual   . Pulmonary nodule 05/29/2016  . Tobacco abuse   . Ventral hernia    Past Surgical History:  Procedure Laterality Date  . BIOPSY  04/29/2017   Procedure: BIOPSY;  Surgeon: Daneil Dolin, MD;  [facility];  Service: Endoscopy;;  gastric and esophageal biopsy  .  COLONOSCOPY WITH PROPOFOL [name] 04/29/2017   Procedure: COLONOSCOPY WITH PROPOFOL;  Surgeon: Daneil Dolin, MD;  Location: [facility];  Service: Endoscopy;  Laterality: [name];  11:00am  . ESOPHAGOGASTRODUODENOSCOPY (EGD) WITH PROPOFOL [name] 04/29/2017   Procedure: ESOPHAGOGASTRODUODENOSCOPY (EGD) WITH PROPOFOL;  Surgeon: Daneil Dolin, MD;  [facility];  Service: Endoscopy;  Laterality: [name];  .  [place] [name] 04/29/2017   Procedure: Venia Minks DILATION;  Surgeon: Daneil Dolin, MD;  Location: [facility];  Service: Endoscopy;  Laterality: [name];  . PARTIAL COLECTOMY    . POLYPECTOMY  04/29/2017   Procedure: POLYPECTOMY;  Surgeon: Daneil Dolin, MD;  [facility];  Service: Endoscopy;;  hepatic flexure polyp  . PORTACATH PLACEMENT Right 03/14/2015   Procedure: INSERTION PORT-A-CATH;  Surgeon: Aviva Signs, MD;  Location: [facility];  Service: General;  Laterality: Right;  . SMALL INTESTINE SURGERY     small bowel resection for obstruction     SOCIAL HISTORY:  Social History   Socioeconomic History  . Marital status: Single    Spouse name: Not on file  . Number of children: Not on file  . Years of education: Not on file  . Highest education level: Not on file  Occupational History  . Not on file  Social Needs  . Financial resource strain: Not on file  . Food insecurity:    Worry: Not on file    Inability: Not on file  . Transportation needs:    Medical: Not on file    Non-medical: Not on file  Tobacco Use  . Smoking status: Current Every Day Smoker    Packs/day: 0.50    Years: 36.00    Pack years: 18.00    Types: Cigarettes  . Smokeless tobacco: Never Used  . Tobacco comment: refused smoking cessation materials.   Substance and Sexual Activity  . Alcohol use: Yes    Alcohol/week: 24.0 standard drinks    Types: 24 Cans of beer per week    Comment: 2 - 3 cans beer daily  . Drug use: Yes    Types: Cocaine    Comment: denied 03/29/17, none in 6-7 months. "occasional"   . Sexual activity: Not Currently  Lifestyle  . Physical activity:    Days per week: Not on file    Minutes per session: Not on file  . Stress: Not on file  Relationships  . Social connections:    Talks on phone: Not on file    Gets together: Not on file    Attends religious service: Not on file    Active member of club or organization: Not on file    Attends  meetings of clubs or organizations: Not on file    Relationship status: Not on file  . Intimate partner violence:    Fear of current or ex partner: Not on file    Emotionally abused: Not on file    Physically abused: Not on file    Forced sexual activity: Not on file  Other Topics Concern  . Not on file  Social History Narrative  . Not on file    FAMILY HISTORY:  Family History  Problem Relation Age of Onset  . Cancer Mother   . Hypertension Father   . Cancer Brother   . Colon cancer Neg Hx     CURRENT MEDICATIONS:  Outpatient Encounter Medications as of 04/10/2018  Medication Sig  . BELBUCA [place]  1 FILM UNDER THE TONGUE EVERY TWELVE HOURS FOR CHRONIC PAIN  . busPIRone (BUSPAR) 15 MG tablet Take by mouth.  . Calcium Carbonate (CALCIUM 600 PO) Take by mouth daily.  . Cholecalciferol (VITAMIN D3) 50 MCG (2000 UT) capsule Take 2,000 Units by mouth daily.  . cyanocobalamin 1000 MCG tablet Take 1 tablet (1,000 mcg total) by mouth daily.  . DULoxetine (CYMBALTA) 30 MG capsule Take 30 mg by mouth daily.  Marland Kitchen esomeprazole (NEXIUM) 20 MG capsule Take 20 mg by mouth 2 (two) times daily.  . furosemide (LASIX) 20 MG tablet Take 0.5 tablets (10 mg total) by mouth daily as needed.  . imatinib (GLEEVEC) 400 MG tablet Take 1 tablet (400 mg total) by mouth 2 (two) times daily.  Marland Kitchen lidocaine-prilocaine (EMLA) cream Apply 1 application topically as needed.  . Magnesium 250 MG TABS Take 250 mg by mouth 2 (two) times daily.  . methocarbamol (ROBAXIN) 750 MG tablet Take 750 mg by mouth every 8 (eight) hours as needed.  . mirtazapine (REMERON) 45 MG tablet Take by mouth.  . potassium chloride SA (K-DUR,KLOR-CON) 20 MEQ tablet Take 3 tablets (60 mEq total) by mouth daily.  . pregabalin (LYRICA) 150 MG capsule Take 150 mg by mouth 3 (three) times daily.  . traZODone (DESYREL) 50 MG tablet Take 50 mg by mouth at bedtime.   No facility-administered encounter medications on file as of  04/10/2018.     ALLERGIES:  No Known Allergies   PHYSICAL EXAM:  ECOG Performance status: 1  Vitals:   04/10/18 0925  BP: 113/78  Pulse: 94  Resp: 18  Temp: 98.5 F (36.9 C)  SpO2: 99%   Filed Weights   04/10/18 0925  Weight: 191 lb (86.6 kg)    Physical Exam Constitutional:      Appearance: Normal appearance. He is normal weight.  Cardiovascular:     Rate and Rhythm: Normal rate and regular rhythm.     Heart sounds: Normal heart sounds.  Pulmonary:     Effort: Pulmonary effort is normal.     Breath sounds: Normal breath sounds.  Abdominal:     General: Abdomen is flat.     Palpations: Abdomen is soft.  Musculoskeletal: Normal range of motion.  Skin:    General: Skin is warm and dry.  Neurological:     Mental Status: He is alert and oriented to person, place, and time. Mental status is at baseline.  Psychiatric:        Mood and Affect: Mood normal.        Behavior: Behavior normal.        Thought Content: Thought content normal.        Judgment: Judgment normal.      LABORATORY DATA:  I have reviewed the labs as listed.  CBC    Component Value Date/Time   WBC 8.3 04/10/2018 0842   RBC 3.56 (L) 04/10/2018 0842   HGB 13.4 04/10/2018 0842   HCT 39.5 04/10/2018 0842   PLT 82 (L) 04/10/2018 0842   MCV 111.0 (H) 04/10/2018 0842   MCH 37.6 (H) 04/10/2018 0842   MCHC 33.9 04/10/2018 0842   RDW 14.6 04/10/2018 0842   LYMPHSABS 1.7 04/10/2018 0842   MONOABS 0.5 04/10/2018 0842   EOSABS 0.3 04/10/2018 0842   BASOSABS 0.1 04/10/2018 0842   CMP Latest Ref Rng & Units 04/10/2018 03/26/2018 03/12/2018  Glucose 70 - 99 mg/dL 129(H) 129(H) 121(H)  BUN 6 - 20 mg/dL 6 5(L) 8  Creatinine  0.61 - 1.24 mg/dL 0.85 0.98 0.93  Sodium 135 - 145 mmol/L 137 136 141  Potassium 3.5 - 5.1 mmol/L 3.6 3.5 3.9  Chloride 98 - 111 mmol/L 104 102 108  CO2 22 - 32 mmol/L 23 24 26   Calcium 8.9 - 10.3 mg/dL 8.3(L) 8.4(L) 7.9(L)  Total Protein 6.5 - 8.1 g/dL 6.4(L) 6.7 5.8(L)  Total  Bilirubin 0.3 - 1.2 mg/dL 0.6 0.5 0.4  Alkaline Phos 38 - 126 U/L 106 84 72  AST 15 - 41 U/L 35 34 22  ALT 0 - 44 U/L 19 23 13        DIAGNOSTIC IMAGING:  I have independently reviewed the scans and discussed with the patient.   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  Stage IV GIST: - Diagnosed in 2014, status post partial small bowel resection on 09/04/2012 with recurrence of disease with extensive peritoneal disease. -he was started on [place] in March 2015. - He is tolerating Gleevec 400 mg daily very well.  He occasionally gets nauseous. - Denies any abdominal pains or bloating.  Denies any bowel habit changes.  Denies any bleeding per rectum or melena. - We reviewed the results of the CT abdomen pelvis dated 01/29/2018 which shows 2 cavitary soft tissue nodules in the left abdomen concerning for progressive disease.  Larger of the 2 lesions is contiguous with the descending colon and is gas-filled, with possible communication with the colonic lumen..  Measures 3.3 cm is contiguous with adjacent small bowel and is at the location of 15 mm nodule on prior scan from October 2018.  The smaller of the 2 lesions is more proximal along the descending colon measuring 1.2 x 1.7 cm. - We talked about starting him on Sutent 50 mg 4 weeks on 2 weeks off versus increasing imatinib to 800 mg daily.  As he was tolerating imatinib very well, we have plan to increase imatinib. -However at last visit his CBC showed decreased platelet count of 37.  We have held his imatinib. -He was started back on Gleevec 1 tablet daily on 03/05/2018 when his platelet count improved to 120.  - He is tolerating Gleevec 1 tablet daily very well.  His platelet count is stable at 93 for the last 2 weeks. - We will increase his Gleevec to 1 tablet twice daily.  I plan to repeat his blood work in 2 weeks. - We will continue Gleevec as long as his platelet count stays  above 50. - He is continuing to take B12 tablet daily.  His hemoglobin improved to 12.2. -He was reportedly started on duloxetine 30 mg by his PMD. - He reports on 04/10/2018 taking his Gleevec 1 and 1/2 pills in the evening due to it causing nausea in the morning.  He was educated about the importance of taking them 12 hours apart with food.  He has nausea medicine and will start taking 1pill in the AM 1pill in the PM. - We will see him back in 3 weeks with labs and a port flush.  2.  Polysubstance abuse: -Reading through the chart, I see documentation of history of EtOH and cocaine abuse with urine drug screen positive for cocaine and negative for opioids on multiple occasions. -he is on buprenorphine buccal film. - He was told to be more compliant with his appointments.      Orders placed this encounter:  Orders Placed This Encounter  Procedures  . Lactate dehydrogenase  .  CBC with Differential/Platelet  . Comprehensive metabolic panel      Derek Jack, MD [place] [phone]

## 2018-04-10 NOTE — Patient Instructions (Signed)
[facility] at [facility] Discharge Instructions  Follow up in 3 weeks with labs   Thank you for choosing [facility] at [facility] to provide your oncology and hematology care.  To afford each patient quality time with our provider, please arrive at least 15 minutes before your scheduled appointment time.   If you have a lab appointment with the Cancer Center please come in thru the  Main Entrance and check in at the main information desk  You need to re-schedule your appointment should you arrive 10 or more minutes late.  We strive to give you quality time with our providers, and arriving late affects you and other patients whose appointments are after yours.  Also, if you no show three or more times for appointments you may be dismissed from the clinic at the providers discretion.     Again, thank you for choosing [facility].  Our hope is that these requests will decrease the amount of time that you wait before being seen by our physicians.       _____________________________________________________________  Should you have questions after your visit to  Cancer Center, please contact our office at [phone] between the hours of 8:00 a.m. and 4:30 p.m.  Voicemails left after 4:00 p.m. will not be returned until the following business day.  For prescription refill requests, have your pharmacy contact our office and allow 72 hours.    Cancer Center Support Programs:   > Cancer Support Group  2nd Tuesday of the month 1pm-2pm, Journey Room    

## 2018-04-30 ENCOUNTER — Inpatient Hospital Stay (HOSPITAL_BASED_OUTPATIENT_CLINIC_OR_DEPARTMENT_OTHER): Payer: Medicaid Other | Admitting: Nurse Practitioner

## 2018-04-30 ENCOUNTER — Inpatient Hospital Stay (HOSPITAL_COMMUNITY): Payer: Medicaid Other | Attending: Internal Medicine

## 2018-04-30 ENCOUNTER — Other Ambulatory Visit: Payer: Self-pay

## 2018-04-30 ENCOUNTER — Encounter (HOSPITAL_COMMUNITY): Payer: Self-pay

## 2018-04-30 DIAGNOSIS — F17210 Nicotine dependence, cigarettes, uncomplicated: Secondary | ICD-10-CM | POA: Diagnosis not present

## 2018-04-30 DIAGNOSIS — C49A3 Gastrointestinal stromal tumor of small intestine: Secondary | ICD-10-CM | POA: Diagnosis not present

## 2018-04-30 DIAGNOSIS — R110 Nausea: Secondary | ICD-10-CM

## 2018-04-30 DIAGNOSIS — R911 Solitary pulmonary nodule: Secondary | ICD-10-CM | POA: Diagnosis not present

## 2018-04-30 DIAGNOSIS — Z79899 Other long term (current) drug therapy: Secondary | ICD-10-CM | POA: Insufficient documentation

## 2018-04-30 DIAGNOSIS — F1010 Alcohol abuse, uncomplicated: Secondary | ICD-10-CM | POA: Diagnosis not present

## 2018-04-30 DIAGNOSIS — C786 Secondary malignant neoplasm of retroperitoneum and peritoneum: Secondary | ICD-10-CM

## 2018-04-30 DIAGNOSIS — G893 Neoplasm related pain (acute) (chronic): Secondary | ICD-10-CM | POA: Diagnosis not present

## 2018-04-30 DIAGNOSIS — K219 Gastro-esophageal reflux disease without esophagitis: Secondary | ICD-10-CM | POA: Insufficient documentation

## 2018-04-30 DIAGNOSIS — F419 Anxiety disorder, unspecified: Secondary | ICD-10-CM | POA: Insufficient documentation

## 2018-04-30 LAB — CBC WITH DIFFERENTIAL/PLATELET
ABS IMMATURE GRANULOCYTES: 0.03 10*3/uL (ref 0.00–0.07)
Basophils Absolute: 0 10*3/uL (ref 0.0–0.1)
Basophils Relative: 1 %
Eosinophils Absolute: 0.1 10*3/uL (ref 0.0–0.5)
Eosinophils Relative: 2 %
HCT: 34.9 % — ABNORMAL LOW (ref 39.0–52.0)
Hemoglobin: 11.8 g/dL — ABNORMAL LOW (ref 13.0–17.0)
Immature Granulocytes: 0 %
Lymphocytes Relative: 17 %
Lymphs Abs: 1.4 10*3/uL (ref 0.7–4.0)
MCH: 36.6 pg — ABNORMAL HIGH (ref 26.0–34.0)
MCHC: 33.8 g/dL (ref 30.0–36.0)
MCV: 108.4 fL — ABNORMAL HIGH (ref 80.0–100.0)
Monocytes Absolute: 0.8 10*3/uL (ref 0.1–1.0)
Monocytes Relative: 10 %
Neutro Abs: 5.7 10*3/uL (ref 1.7–7.7)
Neutrophils Relative %: 70 %
Platelets: 126 10*3/uL — ABNORMAL LOW (ref 150–400)
RBC: 3.22 MIL/uL — ABNORMAL LOW (ref 4.22–5.81)
RDW: 14.1 % (ref 11.5–15.5)
WBC: 8.1 10*3/uL (ref 4.0–10.5)
nRBC: 0 % (ref 0.0–0.2)

## 2018-04-30 LAB — COMPREHENSIVE METABOLIC PANEL
ALK PHOS: 87 U/L (ref 38–126)
ALT: 16 U/L (ref 0–44)
AST: 27 U/L (ref 15–41)
Albumin: 2.9 g/dL — ABNORMAL LOW (ref 3.5–5.0)
Anion gap: 7 (ref 5–15)
BUN: 6 mg/dL (ref 6–20)
CALCIUM: 7.8 mg/dL — AB (ref 8.9–10.3)
CO2: 26 mmol/L (ref 22–32)
Chloride: 103 mmol/L (ref 98–111)
Creatinine, Ser: 0.77 mg/dL (ref 0.61–1.24)
GFR calc Af Amer: >60 mL/min (ref >60)
GFR calc non Af Amer: >60 mL/min (ref >60)
Glucose, Bld: 78 mg/dL (ref 70–99)
Potassium: 3.4 mmol/L — ABNORMAL LOW (ref 3.5–5.1)
Sodium: 136 mmol/L (ref 135–145)
Total Bilirubin: 0.5 mg/dL (ref 0.3–1.2)
Total Protein: 6.3 g/dL — ABNORMAL LOW (ref 6.5–8.1)

## 2018-04-30 LAB — LACTATE DEHYDROGENASE: LDH: 185 U/L (ref 98–192)

## 2018-04-30 MED ORDER — HEPARIN SOD (PORK) LOCK FLUSH 100 UNIT/ML IV SOLN
500.0000 [IU] | Freq: Once | INTRAVENOUS | Status: AC
  Administered 2018-04-30: 500 [IU] via INTRAVENOUS

## 2018-04-30 MED ORDER — SODIUM CHLORIDE 0.9% FLUSH
10.0000 mL | Freq: Once | INTRAVENOUS | Status: AC
  Administered 2018-04-30: 10 mL

## 2018-04-30 NOTE — Assessment & Plan Note (Addendum)
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 - Was started on [place] in March 2015. - He tolerated the Gleevec 400 mg daily very well with occasional nausea.  He denied any abdominal pains bloating or bowel habit changes.  Also denied any bleeding per rectum or melena. - He had a CT abdomen and pelvis on 01/29/2018 that showed 2 cavitary soft tissue nodules in the left abdomen concerning for progression of disease.  The larger of the 2 lesions is contiguous with the descending colon and is a gas filled, with possible communication with the colonic lumen.  This measures 3.3 cm is contiguous with adjacent small bowel and is at the location of 15 m nodule on prior scan from October 2018.  The smaller of the 2 lesions is more proximal along the descending colon measuring 1.2 x 1.7 cm. - He was on imatinib and it was increased to 800 mg daily.  He tolerated the lower dose well, however with the increased dose his platelet count dropped to 37, and the imatinib was held at this time. - He was started back on Gleevec 1 tablet daily on 03/05/2018 when his platelet count improved to 120.  He tolerated the Gleevec well at this dose. -His Gleevec was increased to 1 tablet twice daily on 03/26/2018. - Labs were drawn on 04/10/2018 and his platelet count was 83.  He did report he was only taken 1-1/2 pills daily.  He was re-educated about the importance of taking them 12 hours apart with food and will start taking 1 pill twice daily. -The pills are causing him some nausea he was told to take his nausea medication twice a day with his pills to see if that helped. - We will continue Gleevec as long as his platelet count stays above 50. -She is continuing to take B12 tablets daily.  His hemoglobin is 11.8 on 04/30/2018. -His platelet count on 04/30/2018 is 126.  He reports he has been taking a half a pill in  the morning and a whole pill at night.  He will start taking a whole pill in the morning and a whole pill at night starting on 05/01/2018. - We will see him back in 3 weeks with labs.  2.polysubstance abuse: - It is documented in his chart he has a history of EtOH and cocaine abuse with urine drug screen positive for cocaine and negative for opioids on multiple occasions. -He is on buprenorphine buccal film. - He has promised to be more compliant with medications and his appointments.

## 2018-04-30 NOTE — Progress Notes (Signed)
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)   08/25/2014 Imaging    CT CAP- Interval development of omental/peritoneal metastasis, including a dominant left abdominal 2.6 cm pericolonic nodule.    02/10/2015 Imaging    CT CAP-  Mildly reduced size of left abdominal tumor implants. No new tumor implants identified.    03/14/2015 Procedure    Port-a-cath placed by Dr. Arnoldo Morale    03/14/2016 Imaging    CT CAP- 1. Continued reduction in size of left abdominal peritoneal metastases. No new or progressive metastatic disease in the abdomen or pelvis. 2. Solitary new irregular 1.0 cm nodular opacity in the anterior right upper lobe, highly nonspecific, potentially inflammatory, however cannot exclude pulmonary malignancy (metastatic or primary lung carcinoma). New nonspecific mild subcarinal and right hilar lymphadenopathy, cannot exclude new nodal metastases. PET-CT could be useful for further characterization / biopsy planning, as clinically warranted. Alternatively, a follow-up chest CT with IV contrast could be obtained in 3 months.    07/03/2016 Imaging    Ct chest- No findings specific for metastatic disease in the chest.  Small thoracic lymph nodes measuring up to 9 mm short axis, decreased.  No suspicious pulmonary nodules.     INTERVAL HISTORY:  [name] 58 y.o. male returns for routine follow-up for stage I GIST.  He reports he has been taking half a tablet in the a.m. and a whole tablet in the p.m. and has been tolerating it well.  He reports occasional nausea.  He reports he will start today taking 1 pill in the morning and 1 pill  in the evening as prescribed.  If he has any more nausea he will contact us.  His eating and appetite have improved.  He did fall 4 days ago and hit a chair and has a bruise on his left chest above his nipple.  It is bruised but improved per patient.  He still has chronic abdominal pain that he manages.  It is stable and unchanged.  Denies any diarrhea. Denies any new pains. Had not noticed any recent bleeding such as epistaxis, hematuria or hematochezia. Denies recent chest pain on exertion, shortness of breath on minimal exertion, pre-syncopal episodes, or palpitations. Denies any numbness or tingling in hands or feet. Denies any recent fevers, infections, or recent hospitalizations. Patient reports appetite at 100% and energy level at 75%.    REVIEW OF SYSTEMS:  Review of Systems  Gastrointestinal: Positive for abdominal pain and nausea.  Hematological: Bruises/bleeds easily.  Psychiatric/Behavioral: The patient is nervous/anxious.   All other systems reviewed and are negative.    PAST MEDICAL/SURGICAL HISTORY:  Past Medical History:  Diagnosis Date  . Alcohol abuse   . Anemia   . Anxiety 09/04/2012  . GERD (gastroesophageal reflux disease)   . GIST (gastrointestinal stroma tumor), malignant, colon ([facility])   . Hx of bipolar disorder   . Malignant GIST ([facility]) 09/23/2014   initially diagnosed in 2014  . Pancreatitis   . PE (physical exam), annual   . Pulmonary nodule 05/29/2016  . Tobacco abuse   . Ventral hernia    Past Surgical History:  Procedure Laterality Date  .  BIOPSY  04/29/2017   Procedure: BIOPSY;  Surgeon: Daneil Dolin, MD;  [facility];  Service: Endoscopy;;  gastric and esophageal biopsy  . COLONOSCOPY WITH PROPOFOL [name] 04/29/2017   Procedure: COLONOSCOPY WITH PROPOFOL;  Surgeon: Daneil Dolin, MD;  [facility]: [facility];  Service: Endoscopy;  Laterality: [name];  11:00am  . ESOPHAGOGASTRODUODENOSCOPY (EGD) WITH PROPOFOL [name] 04/29/2017   Procedure:  ESOPHAGOGASTRODUODENOSCOPY (EGD) WITH PROPOFOL;  Surgeon: Daneil Dolin, MD;  [facility]: [facility];  Service: Endoscopy;  Laterality: [name];  . [place] [name] 04/29/2017   Procedure: Venia Minks DILATION;  Surgeon: Daneil Dolin, MD;  Location: [facility];  Service: Endoscopy;  Laterality: [name];  . PARTIAL COLECTOMY    . POLYPECTOMY  04/29/2017   Procedure: POLYPECTOMY;  Surgeon: Daneil Dolin, MD;  [facility]: [facility];  Service: Endoscopy;;  hepatic flexure polyp  . PORTACATH PLACEMENT Right 03/14/2015   Procedure: INSERTION PORT-A-CATH;  Surgeon: Aviva Signs, MD;  Location: [facility];  Service: General;  Laterality: Right;  . SMALL INTESTINE SURGERY     small bowel resection for obstruction     SOCIAL HISTORY:  Social History   Socioeconomic History  . Marital status: Single    Spouse name: Not on file  . Number of children: Not on file  . Years of education: Not on file  . Highest education level: Not on file  Occupational History  . Not on file  Social Needs  . Financial resource strain: Not on file  . Food insecurity:    Worry: Not on file    Inability: Not on file  . Transportation needs:    Medical: Not on file    Non-medical: Not on file  Tobacco Use  . Smoking status: Current Every Day Smoker    Packs/day: 0.50    Years: 36.00    Pack years: 18.00    Types: Cigarettes  . Smokeless tobacco: Never Used  . Tobacco comment: refused smoking cessation materials.   Substance and Sexual Activity  . Alcohol use: Yes    Alcohol/week: 24.0 standard drinks    Types: 24 Cans of beer per week    Comment: 2 - 3 cans beer daily  . Drug use: Yes    Types: Cocaine    Comment: denied 03/29/17, none in 6-7 months. "occasional"   . Sexual activity: Not Currently  Lifestyle  . Physical activity:    Days per week: Not on file    Minutes per session: Not on file  . Stress: Not on file  Relationships  . Social connections:    Talks on phone: Not on  file    Gets together: Not on file    Attends religious service: Not on file    Active member of club or organization: Not on file    Attends meetings of clubs or organizations: Not on file    Relationship status: Not on file  . Intimate partner violence:    Fear of current or ex partner: Not on file    Emotionally abused: Not on file    Physically abused: Not on file    Forced sexual activity: Not on file  Other Topics Concern  . Not on file  Social History Narrative  . Not on file    FAMILY HISTORY:  Family History  Problem Relation Age of Onset  . Cancer Mother   . Hypertension Father   . Cancer Brother   .  Colon cancer Neg Hx     CURRENT MEDICATIONS:  Outpatient Encounter Medications as of 04/30/2018  Medication Sig  . BELBUCA 300 MCG FILM DISSOLVE 1 FILM UNDER THE TONGUE EVERY TWELVE HOURS FOR CHRONIC PAIN  . busPIRone (BUSPAR) 15 MG tablet Take by mouth.  . Calcium Carbonate (CALCIUM 600 PO) Take by mouth daily.  . Cholecalciferol (VITAMIN D3) 50 MCG (2000 UT) capsule Take 2,000 Units by mouth daily.  . cyanocobalamin 1000 MCG tablet Take 1 tablet (1,000 mcg total) by mouth daily.  . DULoxetine (CYMBALTA) 30 MG capsule Take 30 mg by mouth daily.  Marland Kitchen esomeprazole (NEXIUM) 20 MG capsule Take 20 mg by mouth 2 (two) times daily.  . furosemide (LASIX) 20 MG tablet Take 0.5 tablets (10 mg total) by mouth daily as needed.  . imatinib (GLEEVEC) 400 MG tablet Take 1 tablet (400 mg total) by mouth 2 (two) times daily.  Marland Kitchen lidocaine-prilocaine (EMLA) cream Apply 1 application topically as needed.  . Magnesium 250 MG TABS Take 250 mg by mouth 2 (two) times daily.  . methocarbamol (ROBAXIN) 750 MG tablet Take 750 mg by mouth every 8 (eight) hours as needed.  . mirtazapine (REMERON) 45 MG tablet Take by mouth.  . potassium chloride SA (K-DUR,KLOR-CON) 20 MEQ tablet Take 3 tablets (60 mEq total) by mouth daily.  . pregabalin (LYRICA) 150 MG capsule Take 150 mg by mouth 3 (three) times  daily.  . traZODone (DESYREL) 50 MG tablet Take 50 mg by mouth at bedtime.   No facility-administered encounter medications on file as of 04/30/2018.     ALLERGIES:  No Known Allergies   PHYSICAL EXAM:  ECOG Performance status: 1  Vitals:   04/30/18 1254  BP: (!) 148/88  Pulse: 87  Temp: 97.9 F (36.6 C)   There were no vitals filed for this visit.  Physical Exam Constitutional:      Appearance: Normal appearance. He is normal weight.  Cardiovascular:     Rate and Rhythm: Normal rate and regular rhythm.     Heart sounds: Normal heart sounds.  Pulmonary:     Effort: Pulmonary effort is normal.     Breath sounds: Normal breath sounds.  Abdominal:     General: Abdomen is flat.     Palpations: Abdomen is soft.  Musculoskeletal: Normal range of motion.  Skin:    General: Skin is warm and dry.     Comments: Bruise from fall on left chest above his nipple  Neurological:     Mental Status: He is alert and oriented to person, place, and time. Mental status is at baseline.  Psychiatric:        Mood and Affect: Mood normal.        Behavior: Behavior normal.        Thought Content: Thought content normal.        Judgment: Judgment normal.      LABORATORY DATA:  I have reviewed the labs as listed.  CBC    Component Value Date/Time   WBC 8.1 04/30/2018 1217   RBC 3.22 (L) 04/30/2018 1217   HGB 11.8 (L) 04/30/2018 1217   HCT 34.9 (L) 04/30/2018 1217   PLT 126 (L) 04/30/2018 1217   MCV 108.4 (H) 04/30/2018 1217   MCH 36.6 (H) 04/30/2018 1217   MCHC 33.8 04/30/2018 1217   RDW 14.1 04/30/2018 1217   LYMPHSABS 1.4 04/30/2018 1217   MONOABS 0.8 04/30/2018 1217   EOSABS 0.1 04/30/2018 1217   BASOSABS 0.0 04/30/2018  1217   CMP Latest Ref Rng & Units 04/30/2018 04/10/2018 03/26/2018  Glucose 70 - 99 mg/dL 78 129(H) 129(H)  BUN 6 - 20 mg/dL 6 6 5(L)  Creatinine 0.61 - 1.24 mg/dL 0.77 0.85 0.98  Sodium 135 - 145 mmol/L 136 137 136  Potassium 3.5 - 5.1 mmol/L 3.4(L) 3.6 3.5   Chloride 98 - 111 mmol/L 103 104 102  CO2 22 - 32 mmol/L 26 23 24   Calcium 8.9 - 10.3 mg/dL 7.8(L) 8.3(L) 8.4(L)  Total Protein 6.5 - 8.1 g/dL 6.3(L) 6.4(L) 6.7  Total Bilirubin 0.3 - 1.2 mg/dL 0.5 0.6 0.5  Alkaline Phos 38 - 126 U/L 87 106 84  AST 15 - 41 U/L 27 35 34  ALT 0 - 44 U/L 16 19 23       I personally performed a face-to-face visit, made revisions and my assessment and plan is as follows.    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 - Was started on [place] in March 2015. - He tolerated the Gleevec 400 mg daily very well with occasional nausea.  He denied any abdominal pains bloating or bowel habit changes.  Also denied any bleeding per rectum or melena. - He had a CT abdomen and pelvis on 01/29/2018 that showed 2 cavitary soft tissue nodules in the left abdomen concerning for progression of disease.  The larger of the 2 lesions is contiguous with the descending colon and is a gas filled, with possible communication with the colonic lumen.  This measures 3.3 cm is contiguous with adjacent small bowel and is at the location of 15 m nodule on prior scan from October 2018.  The smaller of the 2 lesions is more proximal along the descending colon measuring 1.2 x 1.7 cm. - He was on imatinib and it was increased to 800 mg daily.  He tolerated the lower dose well, however with the increased dose his platelet count dropped to 37, and the imatinib was held at this time. - He was started back on Gleevec 1 tablet daily on 03/05/2018 when his platelet count improved to 120.  He tolerated the Gleevec well at this dose. -His Gleevec was increased to 1 tablet twice daily on 03/26/2018. - Labs were drawn on 04/10/2018 and his platelet count was 83.  He did report he was only taken 1-1/2  pills daily.  He was re-educated about the importance of taking them 12 hours apart with food and will start taking 1 pill twice daily. -The pills are causing him some nausea he was told to take his nausea medication twice a day with his pills to see if that helped. - We will continue Gleevec as long as his platelet count stays above 50. -She is continuing to take B12 tablets daily.  His hemoglobin is 11.8 on 04/30/2018. -His platelet count on 04/30/2018 is 126.  He reports he has been taking a half a pill in the morning and a whole pill at night.  He will start taking a whole pill in the morning and a whole pill at night starting on 05/01/2018. - We will see him back in 3 weeks with labs.  2.polysubstance abuse: - It is documented in his chart he has a history of EtOH and cocaine abuse with urine drug screen positive for cocaine and negative  for opioids on multiple occasions. -He is on buprenorphine buccal film. - He has promised to be more compliant with medications and his appointments.      Orders placed this encounter:  Orders Placed This Encounter  Procedures  . CBC with Differential/Platelet  . Comprehensive metabolic panel  . Lactate dehydrogenase      Francene Finders FNP-C [place] [phone]

## 2018-04-30 NOTE — Patient Instructions (Signed)
[place] at [facility] Discharge Instructions  Follow up in 3 weeks with labs. Start taking 1 tablet twice daily.   Thank you for choosing [place] at [facility] to provide your oncology and hematology care.  To afford each patient quality time with our provider, please arrive at least 15 minutes before your scheduled appointment time.   If you have a lab appointment with the [facility] please come in thru the  Main Entrance and check in at the main information desk  You need to re-schedule your appointment should you arrive 10 or more minutes late.  We strive to give you quality time with our providers, and arriving late affects you and other patients whose appointments are after yours.  Also, if you no show three or more times for appointments you may be dismissed from the clinic at the providers discretion.     Again, thank you for choosing [facility].  Our hope is that these requests will decrease the amount of time that you wait before being seen by our physicians.       _____________________________________________________________  Should you have questions after your visit to [facility], please contact our office at [phone] between the hours of 8:00 a.m. and 4:30 p.m.  Voicemails left after 4:00 p.m. will not be returned until the following business day.  For prescription refill requests, have your pharmacy contact our office and allow 72 hours.    Cancer Center Support Programs:   > Cancer Support Group  2nd Tuesday of the month 1pm-2pm, Journey Room

## 2018-04-30 NOTE — Patient Instructions (Signed)
[facility] at [facility]  Discharge Instructions:   _______________________________________________________________  Thank you for choosing [facility] at [facility] to provide your oncology and hematology care.  To afford each patient quality time with our providers, please arrive at least 15 minutes before your scheduled appointment.  You need to re-schedule your appointment if you arrive 10 or more minutes late.  We strive to give you quality time with our providers, and arriving late affects you and other patients whose appointments are after yours.  Also, if you no show three or more times for appointments you may be dismissed from the clinic.  Again, thank you for choosing [facility] at [facility]. Our hope is that these requests will allow you access to exceptional care and in a timely manner. _______________________________________________________________  If you have questions after your visit, please contact our office at [phone] between the hours of 8:30 a.m. and 5:00 p.m. Voicemails left after 4:30 p.m. will not be returned until the following business day. _______________________________________________________________  For prescription refill requests, have your pharmacy contact our office. _______________________________________________________________  Recommendations made by the consultant and any test results will be sent to your referring physician. _______________________________________________________________ 

## 2018-04-30 NOTE — Progress Notes (Signed)
[name] presented for Portacath access and flush. Portacath located in the right chest wall accessed with  H 20 needle. Clean, Dry and Intact Good blood return present. Portacath flushed with 3ml NS and 500U/55ml Heparin per protocol and needle removed intact. Procedure without incident. Labs drawn.  Patient tolerated procedure well.

## 2018-05-01 ENCOUNTER — Other Ambulatory Visit (HOSPITAL_COMMUNITY): Payer: Medicaid Other

## 2018-05-01 ENCOUNTER — Ambulatory Visit (HOSPITAL_COMMUNITY): Payer: Medicaid Other | Admitting: Nurse Practitioner

## 2018-05-21 ENCOUNTER — Ambulatory Visit (HOSPITAL_COMMUNITY): Payer: Medicaid Other | Admitting: Nurse Practitioner

## 2018-05-21 ENCOUNTER — Other Ambulatory Visit (HOSPITAL_COMMUNITY): Payer: Medicaid Other

## 2018-05-22 ENCOUNTER — Other Ambulatory Visit (HOSPITAL_COMMUNITY): Payer: Self-pay | Admitting: *Deleted

## 2018-05-22 DIAGNOSIS — C49A3 Gastrointestinal stromal tumor of small intestine: Secondary | ICD-10-CM

## 2018-05-22 DIAGNOSIS — C49A9 Gastrointestinal stromal tumor of other sites: Secondary | ICD-10-CM

## 2018-05-22 MED ORDER — IMATINIB MESYLATE 400 MG PO TABS: 400.0000 mg | ORAL_TABLET | Freq: Two times a day (BID) | ORAL | 1 refills | Status: DC

## 2018-05-22 NOTE — Telephone Encounter (Signed)
Chart reviewed, per Francene Finders, NPs last office note patient to continue [place].  Refills sent to pharmacy.

## 2018-05-27 ENCOUNTER — Inpatient Hospital Stay (HOSPITAL_BASED_OUTPATIENT_CLINIC_OR_DEPARTMENT_OTHER): Payer: Medicaid Other | Admitting: Nurse Practitioner

## 2018-05-27 ENCOUNTER — Other Ambulatory Visit: Payer: Self-pay

## 2018-05-27 ENCOUNTER — Other Ambulatory Visit (HOSPITAL_COMMUNITY): Payer: Medicaid Other

## 2018-05-27 ENCOUNTER — Ambulatory Visit (HOSPITAL_COMMUNITY): Payer: Medicaid Other | Admitting: Nurse Practitioner

## 2018-05-27 ENCOUNTER — Inpatient Hospital Stay (HOSPITAL_COMMUNITY): Payer: Medicaid Other | Attending: Internal Medicine

## 2018-05-27 DIAGNOSIS — C49A3 Gastrointestinal stromal tumor of small intestine: Secondary | ICD-10-CM

## 2018-05-27 DIAGNOSIS — R110 Nausea: Secondary | ICD-10-CM

## 2018-05-27 DIAGNOSIS — F1910 Other psychoactive substance abuse, uncomplicated: Secondary | ICD-10-CM

## 2018-05-27 DIAGNOSIS — Z7289 Other problems related to lifestyle: Secondary | ICD-10-CM

## 2018-05-27 DIAGNOSIS — R5383 Other fatigue: Secondary | ICD-10-CM | POA: Diagnosis not present

## 2018-05-27 DIAGNOSIS — Z79899 Other long term (current) drug therapy: Secondary | ICD-10-CM | POA: Diagnosis not present

## 2018-05-27 DIAGNOSIS — F17210 Nicotine dependence, cigarettes, uncomplicated: Secondary | ICD-10-CM

## 2018-05-27 DIAGNOSIS — R109 Unspecified abdominal pain: Secondary | ICD-10-CM | POA: Insufficient documentation

## 2018-05-27 DIAGNOSIS — C786 Secondary malignant neoplasm of retroperitoneum and peritoneum: Secondary | ICD-10-CM | POA: Insufficient documentation

## 2018-05-27 DIAGNOSIS — K219 Gastro-esophageal reflux disease without esophagitis: Secondary | ICD-10-CM | POA: Insufficient documentation

## 2018-05-27 DIAGNOSIS — R600 Localized edema: Secondary | ICD-10-CM | POA: Insufficient documentation

## 2018-05-27 LAB — CBC WITH DIFFERENTIAL/PLATELET
Abs Immature Granulocytes: 0.03 10*3/uL (ref 0.00–0.07)
Basophils Absolute: 0.1 10*3/uL (ref 0.0–0.1)
Basophils Relative: 1 %
Eosinophils Absolute: 0.3 10*3/uL (ref 0.0–0.5)
Eosinophils Relative: 5 %
HCT: 36.3 % — ABNORMAL LOW (ref 39.0–52.0)
Hemoglobin: 12.1 g/dL — ABNORMAL LOW (ref 13.0–17.0)
Immature Granulocytes: 1 %
Lymphocytes Relative: 20 %
Lymphs Abs: 1.2 10*3/uL (ref 0.7–4.0)
MCH: 36 pg — ABNORMAL HIGH (ref 26.0–34.0)
MCHC: 33.3 g/dL (ref 30.0–36.0)
MCV: 108 fL — ABNORMAL HIGH (ref 80.0–100.0)
Monocytes Absolute: 0.4 10*3/uL (ref 0.1–1.0)
Monocytes Relative: 7 %
Neutro Abs: 4.1 10*3/uL (ref 1.7–7.7)
Neutrophils Relative %: 66 %
Platelets: 122 10*3/uL — ABNORMAL LOW (ref 150–400)
RBC: 3.36 MIL/uL — ABNORMAL LOW (ref 4.22–5.81)
RDW: 15.7 % — ABNORMAL HIGH (ref 11.5–15.5)
WBC: 6.1 10*3/uL (ref 4.0–10.5)
nRBC: 0 % (ref 0.0–0.2)

## 2018-05-27 LAB — COMPREHENSIVE METABOLIC PANEL
ALT: 20 U/L (ref 0–44)
AST: 54 U/L — ABNORMAL HIGH (ref 15–41)
Albumin: 3 g/dL — ABNORMAL LOW (ref 3.5–5.0)
Alkaline Phosphatase: 103 U/L (ref 38–126)
Anion gap: 10 (ref 5–15)
BUN: 5 mg/dL — ABNORMAL LOW (ref 6–20)
CO2: 23 mmol/L (ref 22–32)
Calcium: 7.8 mg/dL — ABNORMAL LOW (ref 8.9–10.3)
Chloride: 104 mmol/L (ref 98–111)
Creatinine, Ser: 0.85 mg/dL (ref 0.61–1.24)
GFR calc Af Amer: >60 mL/min (ref >60)
GFR calc non Af Amer: >60 mL/min (ref >60)
Glucose, Bld: 118 mg/dL — ABNORMAL HIGH (ref 70–99)
Potassium: 4.1 mmol/L (ref 3.5–5.1)
Sodium: 137 mmol/L (ref 135–145)
Total Bilirubin: 0.6 mg/dL (ref 0.3–1.2)
Total Protein: 6.4 g/dL — ABNORMAL LOW (ref 6.5–8.1)

## 2018-05-27 LAB — LACTATE DEHYDROGENASE: LDH: 223 U/L — ABNORMAL HIGH (ref 98–192)

## 2018-05-27 NOTE — Progress Notes (Addendum)
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    02/10/2015 Imaging    CT CAP-  Mildly reduced size of left abdominal tumor implants. No new tumor implants identified.    03/14/2015 Procedure    Port-a-cath placed by Dr. Arnoldo Morale    03/14/2016 Imaging    CT CAP- 1. Continued reduction in size of left abdominal peritoneal metastases. No new or progressive metastatic disease in the abdomen or pelvis. 2. Solitary new irregular 1.0 cm nodular opacity in the anterior right upper lobe, highly nonspecific, potentially inflammatory, however cannot exclude pulmonary malignancy (metastatic or primary lung carcinoma). New nonspecific mild subcarinal and right hilar lymphadenopathy, cannot exclude new nodal metastases. PET-CT could be useful for further characterization / biopsy planning, as clinically warranted. Alternatively, a follow-up chest CT with IV contrast could be obtained in 3 months.    07/03/2016 Imaging    Ct chest- No findings specific for metastatic disease in the chest.  Small thoracic lymph nodes measuring up to 9 mm short axis, decreased.  No suspicious pulmonary nodules.      INTERVAL HISTORY:  [name] 58 y.o. male returns for routine follow-up for malignant GIST.  He is here today and has been doing well since his last visit.  He reports his nausea is better.  He reports he is taking his medication as prescribed now.  He has no other side effects from his medication.  He continues to  have fatigue but is the same.  His legs swell on occasion and he has Lasix as needed that helps. Denies any nausea, vomiting, or diarrhea. Denies any new pains. Had not noticed any recent bleeding such as epistaxis, hematuria or hematochezia. Denies recent chest pain on exertion, shortness of breath on minimal exertion, pre-syncopal episodes, or palpitations. Denies any numbness or tingling in hands or feet. Denies any recent fevers, infections, or recent hospitalizations. Patient reports appetite at 100% and energy level at 50%.  He is eating well and maintaining his weight at this time.    REVIEW OF SYSTEMS:  Review of Systems  Constitutional: Positive for fatigue.  Cardiovascular: Positive for leg swelling.  Gastrointestinal: Positive for abdominal pain.  Hematological: Bruises/bleeds easily.  Psychiatric/Behavioral: The patient is nervous/anxious.   All other systems reviewed and are negative.    PAST MEDICAL/SURGICAL HISTORY:  Past Medical History:  Diagnosis Date  . Alcohol abuse   . Anemia   . Anxiety 09/04/2012  . GERD (gastroesophageal reflux disease)   . GIST (gastrointestinal stroma tumor), malignant, colon ([place])   . Hx of bipolar disorder   . Malignant GIST ([facility]) 09/23/2014   initially diagnosed in 2014  . Pancreatitis   . PE (physical exam), annual   . Pulmonary nodule 05/29/2016  . Tobacco abuse   . Ventral hernia    Past Surgical History:  Procedure Laterality Date  . BIOPSY  04/29/2017   Procedure: BIOPSY;  Surgeon: Daneil Dolin, MD;  [facility];  Service: Endoscopy;;  gastric and esophageal biopsy  . COLONOSCOPY WITH  PROPOFOL [name] 04/29/2017   Procedure: COLONOSCOPY WITH PROPOFOL;  Surgeon: Daneil Dolin, MD;  Location: [facility];  Service: Endoscopy;  Laterality: [name];  11:00am  . ESOPHAGOGASTRODUODENOSCOPY (EGD) WITH PROPOFOL [name] 04/29/2017   Procedure: ESOPHAGOGASTRODUODENOSCOPY (EGD) WITH PROPOFOL;  Surgeon: Daneil Dolin, MD;  Location: [facility];  Service: Endoscopy;  Laterality: [name];  . [place] [name] 04/29/2017   Procedure: Venia Minks DILATION;  Surgeon: Daneil Dolin, MD;  Location: [facility];  Service: Endoscopy;  Laterality: [name];  . PARTIAL COLECTOMY    . POLYPECTOMY  04/29/2017   Procedure: POLYPECTOMY;  Surgeon: Daneil Dolin, MD;  Location: [facility];  Service: Endoscopy;;  hepatic flexure polyp  . PORTACATH PLACEMENT Right 03/14/2015   Procedure: INSERTION PORT-A-CATH;  Surgeon: Aviva Signs, MD;  Location: [facility];  Service: General;  Laterality: Right;  . SMALL INTESTINE SURGERY     small bowel resection for obstruction     SOCIAL HISTORY:  Social History   Socioeconomic History  . Marital status: Single    Spouse name: Not on file  . Number of children: Not on file  . Years of education: Not on file  . Highest education level: Not on file  Occupational History  . Not on file  Social Needs  . Financial resource strain: Not on file  . Food insecurity:    Worry: Not on file    Inability: Not on file  . Transportation needs:    Medical: Not on file    Non-medical: Not on file  Tobacco Use  . Smoking status: Current Every Day Smoker    Packs/day: 0.50    Years: 36.00    Pack years: 18.00    Types: Cigarettes  . Smokeless tobacco: Never Used  . Tobacco comment: refused smoking cessation materials.   Substance and Sexual Activity  . Alcohol use: Yes    Alcohol/week: 24.0 standard drinks    Types: 24 Cans of beer per week    Comment: 2 - 3 cans beer daily  . Drug use: Yes    Types: Cocaine    Comment: denied 03/29/17, none in 6-7 months. "occasional"   . Sexual activity: Not Currently  Lifestyle  . Physical activity:    Days per week: Not on file    Minutes per session: Not on file  . Stress: Not on file  Relationships  . Social connections:    Talks on phone: Not on file    Gets together: Not on file    Attends religious service: Not on file    Active  member of club or organization: Not on file    Attends meetings of clubs or organizations: Not on file    Relationship status: Not on file  . Intimate partner violence:    Fear of current or ex partner: Not on file    Emotionally abused: Not on file    Physically abused: Not on file    Forced sexual activity: Not on file  Other Topics Concern  . Not on file  Social History Narrative  . Not on file    FAMILY HISTORY:  Family History  Problem Relation Age of Onset  . Cancer Mother   . Hypertension Father   . Cancer Brother   . Colon cancer Neg Hx     CURRENT MEDICATIONS:  Outpatient Encounter Medications as of 05/27/2018  Medication Sig  . BELBUCA 600 MCG FILM place ONE film  UNDER THE TONGUE EVERY TWELVE HOURS FOR chronic pain  . busPIRone (BUSPAR) 15 MG tablet Take by mouth.  . busPIRone (BUSPAR) 30 MG tablet Take 30 mg by mouth 2 (two) times daily.  . Calcium Carbonate (CALCIUM 600 PO) Take by mouth daily.  . Cholecalciferol (VITAMIN D3) 50 MCG (2000 UT) capsule Take 2,000 Units by mouth daily.  . cyanocobalamin 1000 MCG tablet Take 1 tablet (1,000 mcg total) by mouth daily.  . DULoxetine (CYMBALTA) 30 MG capsule Take 30 mg by mouth daily.  Marland Kitchen esomeprazole (NEXIUM) 20 MG capsule Take 20 mg by mouth 2 (two) times daily.  . furosemide (LASIX) 20 MG tablet Take 0.5 tablets (10 mg total) by mouth daily as needed.  . imatinib (GLEEVEC) 400 MG tablet Take 1 tablet (400 mg total) by mouth 2 (two) times daily.  Marland Kitchen lidocaine-prilocaine (EMLA) cream Apply 1 application topically as needed.  . Magnesium 250 MG TABS Take 250 mg by mouth 2 (two) times daily.  . methocarbamol (ROBAXIN) 500 MG tablet TAKE 1 TO 2 TABLETS BY MOUTH EVERY 8 HOURS AS NEEDED FOR SEVERE pain (MAY cause drowsiness)  . mirtazapine (REMERON) 45 MG tablet Take by mouth.  . oxyCODONE-acetaminophen (PERCOCET) 10-325 MG tablet TAKE 1/2 TABLET BY MOUTH EVERY 4 HOURS AS NEEDED FOR SEVERE PAIN. MAX TWO TABLETS PER DAY  .  potassium chloride SA (K-DUR,KLOR-CON) 20 MEQ tablet Take 3 tablets (60 mEq total) by mouth daily.  . pregabalin (LYRICA) 200 MG capsule Take 200 mg by mouth 3 (three) times daily.  . traZODone (DESYREL) 50 MG tablet Take 50 mg by mouth at bedtime.  . pregabalin (LYRICA) 150 MG capsule Take 150 mg by mouth 3 (three) times daily.  . [DISCONTINUED] methocarbamol (ROBAXIN) 750 MG tablet Take 750 mg by mouth every 8 (eight) hours as needed.  . [DISCONTINUED] oxyCODONE-acetaminophen (PERCOCET/ROXICET) 5-325 MG tablet TAKE 1 TABLET BY MOUTH EVERY 4 HOURS AS NEEDED FOR SEVERE pain (max TWO tabs DAILY)   No facility-administered encounter medications on file as of 05/27/2018.     ALLERGIES:  No Known Allergies   PHYSICAL EXAM:  ECOG Performance status: 1  Vitals:   05/27/18 0845  BP: 127/67  Pulse: 95  Resp: 18  Temp: 98 F (36.7 C)  SpO2: 96%   Filed Weights   05/27/18 0845  Weight: 201 lb (91.2 kg)    Physical Exam Constitutional:      Appearance: Normal appearance. He is normal weight.  Cardiovascular:     Rate and Rhythm: Normal rate and regular rhythm.     Heart sounds: Normal heart sounds.  Pulmonary:     Effort: Pulmonary effort is normal.     Breath sounds: Normal breath sounds.  Abdominal:     General: Bowel sounds are normal.     Palpations: Abdomen is soft.  Musculoskeletal: Normal range of motion.  Skin:    General: Skin is warm and dry.  Neurological:     Mental Status: He is alert and oriented to person, place, and time. Mental status is at baseline.  Psychiatric:        Mood and Affect: Mood normal.        Behavior: Behavior normal.        Thought Content: Thought content normal.        Judgment: Judgment normal.      LABORATORY DATA:  I have reviewed the labs as listed.  CBC    Component Value Date/Time   WBC 6.1 05/27/2018 0832  RBC 3.36 (L) 05/27/2018 0832   HGB 12.1 (L) 05/27/2018 0832   HCT 36.3 (L) 05/27/2018 0832   PLT 122 (L) 05/27/2018  0832   MCV 108.0 (H) 05/27/2018 0832   MCH 36.0 (H) 05/27/2018 0832   MCHC 33.3 05/27/2018 0832   RDW 15.7 (H) 05/27/2018 0832   LYMPHSABS 1.2 05/27/2018 0832   MONOABS 0.4 05/27/2018 0832   EOSABS 0.3 05/27/2018 0832   BASOSABS 0.1 05/27/2018 0832   CMP Latest Ref Rng & Units 05/27/2018 04/30/2018 04/10/2018  Glucose 70 - 99 mg/dL 118(H) 78 129(H)  BUN 6 - 20 mg/dL 5(L) 6 6  Creatinine 0.61 - 1.24 mg/dL 0.85 0.77 0.85  Sodium 135 - 145 mmol/L 137 136 137  Potassium 3.5 - 5.1 mmol/L 4.1 3.4(L) 3.6  Chloride 98 - 111 mmol/L 104 103 104  CO2 22 - 32 mmol/L 23 26 23   Calcium 8.9 - 10.3 mg/dL 7.8(L) 7.8(L) 8.3(L)  Total Protein 6.5 - 8.1 g/dL 6.4(L) 6.3(L) 6.4(L)  Total Bilirubin 0.3 - 1.2 mg/dL 0.6 0.5 0.6  Alkaline Phos 38 - 126 U/L 103 87 106  AST 15 - 41 U/L 54(H) 27 35  ALT 0 - 44 U/L 20 16 19     I personally performed a face-to-face visit.    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 - Was started on [place] in March 2015. - He tolerated the Gleevec 400 mg daily very well with occasional nausea.  He denied any abdominal pains bloating or bowel habit changes.  Also denied any bleeding per rectum or melena. - He had a CT abdomen and pelvis on 01/29/2018 that showed 2 cavitary soft tissue nodules in the left abdomen concerning for progression of disease.  The larger of the 2 lesions is contiguous with the descending colon and is a gas filled, with possible communication with the colonic lumen.  This measures 3.3 cm is contiguous with adjacent small bowel and is at the location of 15 m nodule on prior scan from October 2018.  The smaller of the 2 lesions is more proximal along the descending colon measuring 1.2 x 1.7 cm. - He was on imatinib and it was increased to 800 mg  daily.  He tolerated the lower dose well, however with the increased dose his platelet count dropped to 37, and the imatinib was held at this time. - He was started back on Gleevec 1 tablet daily on 03/05/2018 when his platelet count improved to 120.  He tolerated the Gleevec well at this dose. -His Gleevec was increased to 1 tablet twice daily on 03/26/2018. - Labs were drawn on 04/10/2018 and his platelet count was 83.  He did report he was only taken 1&1/2 pills daily.  He was re-educated about the importance of taking them 12 hours apart with food and will start taking 1 pill twice daily. -The pills are causing him some nausea he was told to take his nausea medication twice a day with his pills to see if that helped. - We will continue Gleevec as long as his platelet count stays above 50. -She is continuing to take B12 tablets daily.  His hemoglobin is 11.8 on 04/30/2018. -We recheck labs on 05/27/2018 his platelet count is stable at 122.  His hemoglobin is 12.1. -He reports he has been  taking a half a pill in the morning and a whole pill at night.  He will start taking a whole pill in the morning and a whole pill at night starting on 05/01/2018. -He reports he has been taking 1 whole pill in the morning and 1 whole pill at night which he started on 05/01/2018.  Platelets are stable. -We will repeat CT scans in 2 to 3 months after he started taking the [place] correctly. - We will see him back in 4 weeks with labs.  2.polysubstance abuse: - It is documented in his chart he has a history of EtOH and cocaine abuse with urine drug screen positive for cocaine and negative for opioids on multiple occasions. -He is on buprenorphine buccal film. - He has promised to be more compliant with medications and his appointments.      Orders placed this encounter:  Orders Placed This Encounter  Procedures  . Lactate dehydrogenase  . CBC with Differential/Platelet  . Comprehensive metabolic panel       Francene Finders, FNP-C [place] [phone]

## 2018-05-27 NOTE — Assessment & Plan Note (Addendum)
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 - Was started on [place] in March 2015. - He tolerated the Gleevec 400 mg daily very well with occasional nausea.  He denied any abdominal pains bloating or bowel habit changes.  Also denied any bleeding per rectum or melena. - He had a CT abdomen and pelvis on 01/29/2018 that showed 2 cavitary soft tissue nodules in the left abdomen concerning for progression of disease.  The larger of the 2 lesions is contiguous with the descending colon and is a gas filled, with possible communication with the colonic lumen.  This measures 3.3 cm is contiguous with adjacent small bowel and is at the location of 15 m nodule on prior scan from October 2018.  The smaller of the 2 lesions is more proximal along the descending colon measuring 1.2 x 1.7 cm. - He was on imatinib and it was increased to 800 mg daily.  He tolerated the lower dose well, however with the increased dose his platelet count dropped to 37, and the imatinib was held at this time. - He was started back on Gleevec 1 tablet daily on 03/05/2018 when his platelet count improved to 120.  He tolerated the Gleevec well at this dose. -His Gleevec was increased to 1 tablet twice daily on 03/26/2018. - Labs were drawn on 04/10/2018 and his platelet count was 83.  He did report he was only taken 1&1/2 pills daily.  He was re-educated about the importance of taking them 12 hours apart with food and will start taking 1 pill twice daily. -The pills are causing him some nausea he was told to take his nausea medication twice a day with his pills to see if that helped. - We will continue Gleevec as long as his platelet count stays above 50. -She is continuing to take B12 tablets daily.  His hemoglobin is 11.8 on 04/30/2018. -We recheck labs on 05/27/2018 his platelet count is stable at 122.  His hemoglobin is  12.1. -He reports he has been taking a half a pill in the morning and a whole pill at night.  He will start taking a whole pill in the morning and a whole pill at night starting on 05/01/2018. -He reports he has been taking 1 whole pill in the morning and 1 whole pill at night which he started on 05/01/2018.  Platelets are stable. -We will repeat CT scans in 2 to 3 months after he started taking the [place] correctly. - We will see him back in 4 weeks with labs.  2.polysubstance abuse: - It is documented in his chart he has a history of EtOH and cocaine abuse with urine drug screen positive for cocaine and negative for opioids on multiple occasions. -He is on buprenorphine buccal film. - He has promised to be more compliant with medications and his appointments.

## 2018-05-27 NOTE — Patient Instructions (Addendum)
[place] at [facility] Discharge Instructions  Seen by Francene Finders NP today Follow up in 4 weeks with labs.  Thank you for choosing [place] at [facility] to provide your oncology and hematology care.  To afford each patient quality time with our provider, please arrive at least 15 minutes before your scheduled appointment time.   If you have a lab appointment with the [facility] please come in thru the  Main Entrance and check in at the main information desk  You need to re-schedule your appointment should you arrive 10 or more minutes late.  We strive to give you quality time with our providers, and arriving late affects you and other patients whose appointments are after yours.  Also, if you no show three or more times for appointments you may be dismissed from the clinic at the providers discretion.     Again, thank you for choosing [facility].  Our hope is that these requests will decrease the amount of time that you wait before being seen by our physicians.       _____________________________________________________________  Should you have questions after your visit to [facility], please contact our office at [phone] between the hours of 8:00 a.m. and 4:30 p.m.  Voicemails left after 4:00 p.m. will not be returned until the following business day.  For prescription refill requests, have your pharmacy contact our office and allow 72 hours.    Cancer Center Support Programs:   > Cancer Support Group  2nd Tuesday of the month 1pm-2pm, Journey Room

## 2018-06-26 ENCOUNTER — Ambulatory Visit (HOSPITAL_COMMUNITY): Payer: Medicaid Other | Admitting: Nurse Practitioner

## 2018-06-26 ENCOUNTER — Other Ambulatory Visit (HOSPITAL_COMMUNITY): Payer: Medicaid Other

## 2018-07-15 ENCOUNTER — Ambulatory Visit (HOSPITAL_COMMUNITY): Payer: Medicaid Other | Admitting: Nurse Practitioner

## 2018-07-15 ENCOUNTER — Encounter (HOSPITAL_COMMUNITY): Payer: Medicaid Other

## 2018-07-15 ENCOUNTER — Other Ambulatory Visit (HOSPITAL_COMMUNITY): Payer: Medicaid Other

## 2018-07-21 ENCOUNTER — Other Ambulatory Visit: Payer: Self-pay

## 2018-07-22 ENCOUNTER — Inpatient Hospital Stay (HOSPITAL_COMMUNITY): Payer: Medicaid Other | Admitting: Nurse Practitioner

## 2018-07-22 ENCOUNTER — Inpatient Hospital Stay (HOSPITAL_COMMUNITY): Payer: Medicaid Other

## 2018-07-22 ENCOUNTER — Inpatient Hospital Stay (HOSPITAL_COMMUNITY): Payer: Medicaid Other | Attending: Internal Medicine

## 2018-07-22 DIAGNOSIS — C49A3 Gastrointestinal stromal tumor of small intestine: Secondary | ICD-10-CM | POA: Diagnosis present

## 2018-07-22 DIAGNOSIS — Z9049 Acquired absence of other specified parts of digestive tract: Secondary | ICD-10-CM | POA: Diagnosis not present

## 2018-07-22 DIAGNOSIS — Z452 Encounter for adjustment and management of vascular access device: Secondary | ICD-10-CM | POA: Diagnosis present

## 2018-07-22 DIAGNOSIS — Z79899 Other long term (current) drug therapy: Secondary | ICD-10-CM | POA: Insufficient documentation

## 2018-07-22 DIAGNOSIS — F17210 Nicotine dependence, cigarettes, uncomplicated: Secondary | ICD-10-CM | POA: Diagnosis not present

## 2018-07-22 LAB — CBC WITH DIFFERENTIAL/PLATELET
Abs Immature Granulocytes: 0.02 10*3/uL (ref 0.00–0.07)
Basophils Absolute: 0.1 10*3/uL (ref 0.0–0.1)
Basophils Relative: 1 %
Eosinophils Absolute: 0 10*3/uL (ref 0.0–0.5)
Eosinophils Relative: 0 %
HCT: 31.4 % — ABNORMAL LOW (ref 39.0–52.0)
Hemoglobin: 10.5 g/dL — ABNORMAL LOW (ref 13.0–17.0)
Immature Granulocytes: 0 %
Lymphocytes Relative: 20 %
Lymphs Abs: 1.4 10*3/uL (ref 0.7–4.0)
MCH: 35.8 pg — ABNORMAL HIGH (ref 26.0–34.0)
MCHC: 33.4 g/dL (ref 30.0–36.0)
MCV: 107.2 fL — ABNORMAL HIGH (ref 80.0–100.0)
Monocytes Absolute: 0.5 10*3/uL (ref 0.1–1.0)
Monocytes Relative: 8 %
Neutro Abs: 5 10*3/uL (ref 1.7–7.7)
Neutrophils Relative %: 71 %
Platelets: 35 10*3/uL — ABNORMAL LOW (ref 150–400)
RBC: 2.93 MIL/uL — ABNORMAL LOW (ref 4.22–5.81)
RDW: 14.9 % (ref 11.5–15.5)
WBC: 7 10*3/uL (ref 4.0–10.5)
nRBC: 0 % (ref 0.0–0.2)

## 2018-07-22 LAB — COMPREHENSIVE METABOLIC PANEL
ALT: 55 U/L — ABNORMAL HIGH (ref 0–44)
AST: 125 U/L — ABNORMAL HIGH (ref 15–41)
Albumin: 2.4 g/dL — ABNORMAL LOW (ref 3.5–5.0)
Alkaline Phosphatase: 215 U/L — ABNORMAL HIGH (ref 38–126)
Anion gap: 14 (ref 5–15)
BUN: <5 mg/dL — ABNORMAL LOW (ref 6–20)
CO2: 21 mmol/L — ABNORMAL LOW (ref 22–32)
Calcium: 7.3 mg/dL — ABNORMAL LOW (ref 8.9–10.3)
Chloride: 101 mmol/L (ref 98–111)
Creatinine, Ser: 0.83 mg/dL (ref 0.61–1.24)
GFR calc Af Amer: >60 mL/min (ref >60)
GFR calc non Af Amer: >60 mL/min (ref >60)
Glucose, Bld: 121 mg/dL — ABNORMAL HIGH (ref 70–99)
Potassium: 3.9 mmol/L (ref 3.5–5.1)
Sodium: 136 mmol/L (ref 135–145)
Total Bilirubin: 1 mg/dL (ref 0.3–1.2)
Total Protein: 6 g/dL — ABNORMAL LOW (ref 6.5–8.1)

## 2018-07-22 LAB — LACTATE DEHYDROGENASE: LDH: 435 U/L — ABNORMAL HIGH (ref 98–192)

## 2018-07-23 ENCOUNTER — Other Ambulatory Visit: Payer: Self-pay

## 2018-07-23 ENCOUNTER — Inpatient Hospital Stay (HOSPITAL_BASED_OUTPATIENT_CLINIC_OR_DEPARTMENT_OTHER): Payer: Medicaid Other | Admitting: Nurse Practitioner

## 2018-07-23 DIAGNOSIS — C49A3 Gastrointestinal stromal tumor of small intestine: Secondary | ICD-10-CM

## 2018-07-23 NOTE — Progress Notes (Signed)
Virtual Visit via Telephone Note  I connected with [name] on 07/23/18 at 1500 PM EDT by telephone and verified that I am speaking with the correct person using two identifiers.   I discussed the limitations, risks, security and privacy concerns of performing an evaluation and management service by telephone and the availability of in person appointments. I also discussed with the patient that there may be a patient responsible charge related to this service. The patient expressed understanding and agreed to proceed.   History of Present Illness: He reports he has no complaints at this time. Denies any nausea, vomiting, or diarrhea. Denies any new pains. Had not noticed any recent bleeding such as epistaxis, hematuria or hematochezia. Denies recent chest pain on exertion, shortness of breath on minimal exertion, pre-syncopal episodes, or palpitations. Denies any numbness or tingling in hands or feet. Denies any recent fevers, infections, or recent hospitalizations. Patient reports appetite at 50% and energy level at 50%.    Observations/Objective: - He seemed alert and oriented on the phone with no distress. -Physical assessment deferred due to telephone visit.  Assessment and Plan: 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 - Was started on [place] in March 2015. - He tolerated the Gleevec 400 mg daily very well with occasional nausea.  He denied any abdominal pains bloating or bowel habit changes.  Also denied any bleeding per rectum or melena. - He had a CT abdomen and pelvis on 01/29/2018 that showed 2 cavitary soft tissue nodules in the left abdomen concerning for progression of disease.  The larger of the 2 lesions is contiguous with the descending colon and is a gas filled, with possible communication with the colonic lumen.  This measures 3.3 cm is contiguous with  adjacent small bowel and is at the location of 15 m nodule on prior scan from October 2018.  The smaller of the 2 lesions is more proximal along the descending colon measuring 1.2 x 1.7 cm. - He was on imatinib and it was increased to 800 mg daily.  He tolerated the lower dose well, however with the increased dose his platelet count dropped to 37, and the imatinib was held at this time. - He was started back on Gleevec 1 tablet daily on 03/05/2018 when his platelet count improved to 120.  He tolerated the Gleevec well at this dose. -His Gleevec was increased to 1 tablet twice daily on 03/26/2018. - Labs were drawn on 04/10/2018 and his platelet count was 83.  He did report he was only taken 1&1/2 pills daily.  He was re-educated about the importance of taking them 12 hours apart with food and will start taking 1 pill twice daily. -The pills are causing him some nausea he was told to take his nausea medication twice a day with his pills to see if that helped. - We will continue Gleevec as long as his platelet count stays above 50. -She is continuing to take B12 tablets daily.  His hemoglobin is 11.8 on 04/30/2018. -We recheck labs on 05/27/2018 his platelet count is stable at 122.  His hemoglobin is 12.1. -He reports he has been taking a half a pill in the morning and a whole pill at night.  He will start taking a whole pill in the morning and a whole pill at night starting on 05/01/2018. -He reports he has been taking 1 whole  pill in the morning and 1 whole pill at night which he started on 05/01/2018.  Platelets are stable. -Labs on 07/22/2018 showed his hemoglobin 10.5, WBC 7.0, platelets have dropped down to 35.  LDH 435, AST has increased to 125 ALT is 55, creatinine 0.83. -I have called him and told him to stop taking his Gleevec at this time. - We have discussed decreasing his dose to 400 mg in the a.m. and 200 mg in the p.m. -We will repeat CT scans in 2 to 3 months after he started taking the [place]  correctly. - We will see him back in next week with labs.  2.polysubstance abuse: - It is documented in his chart he has a history of EtOH and cocaine abuse with urine drug screen positive for cocaine and negative for opioids on multiple occasions. -He is on buprenorphine buccal film. - He has promised to be more compliant with medications and his appointments.  Follow Up Instructions: -Stop taking Gleevec. -Follow-up next week with labs.   I discussed the assessment and treatment plan with the patient. The patient was provided an opportunity to ask questions and all were answered. The patient agreed with the plan and demonstrated an understanding of the instructions.   The patient was advised to call back or seek an in-person evaluation if the symptoms worsen or if the condition fails to improve as anticipated.  I provided 20 minutes of non-face-to-face time during this encounter. Video chat is not available for this patient.  Glennie Isle, NP-C

## 2018-07-23 NOTE — Addendum Note (Signed)
Addended by: Glennie Isle on: 07/23/2018 03:19 PM   Modules accepted: Orders

## 2018-07-31 ENCOUNTER — Inpatient Hospital Stay (HOSPITAL_COMMUNITY): Payer: Medicaid Other | Admitting: Nurse Practitioner

## 2018-07-31 ENCOUNTER — Inpatient Hospital Stay (HOSPITAL_COMMUNITY): Payer: Medicaid Other

## 2018-07-31 ENCOUNTER — Other Ambulatory Visit (HOSPITAL_COMMUNITY): Payer: Medicaid Other

## 2018-07-31 NOTE — Assessment & Plan Note (Deleted)
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 - Was started on [place] in March 2015. - He tolerated the Gleevec 400 mg daily very well with occasional nausea.  He denied any abdominal pains bloating or bowel habit changes.  Also denied any bleeding per rectum or melena. - He had a CT abdomen and pelvis on 01/29/2018 that showed 2 cavitary soft tissue nodules in the left abdomen concerning for progression of disease.  The larger of the 2 lesions is contiguous with the descending colon and is a gas filled, with possible communication with the colonic lumen.  This measures 3.3 cm is contiguous with adjacent small bowel and is at the location of 15 m nodule on prior scan from October 2018.  The smaller of the 2 lesions is more proximal along the descending colon measuring 1.2 x 1.7 cm. - He was on imatinib and it was increased to 800 mg daily.  He tolerated the lower dose well, however with the increased dose his platelet count dropped to 37, and the imatinib was held at this time. - He was started back on Gleevec 1 tablet daily on 03/05/2018 when his platelet count improved to 120.  He tolerated the Gleevec well at this dose. -His Gleevec was increased to 1 tablet twice daily on 03/26/2018. - Labs were drawn on 04/10/2018 and his platelet count was 83.  He did report he was only taken 1&1/2 pills daily.  He was re-educated about the importance of taking them 12 hours apart with food and will start taking 1 pill twice daily. -The pills are causing him some nausea he was told to take his nausea medication twice a day with his pills to see if that helped. - We will continue Gleevec as long as his platelet count stays above 50. -She is continuing to take B12 tablets daily.  His hemoglobin is 11.8 on 04/30/2018. -We recheck labs on 05/27/2018 his platelet count is stable at 122.  His hemoglobin is  12.1. -He reports he has been taking a half a pill in the morning and a whole pill at night.  He will start taking a whole pill in the morning and a whole pill at night starting on 05/01/2018. -He reports he has been taking 1 whole pill in the morning and 1 whole pill at night which he started on 05/01/2018.  Platelets are stable. -We will repeat CT scans in 2 to 3 months after he started taking the [place] correctly. - We will see him back in 4 weeks with labs.  2.polysubstance abuse: - It is documented in his chart he has a history of EtOH and cocaine abuse with urine drug screen positive for cocaine and negative for opioids on multiple occasions. -He is on buprenorphine buccal film. - He has promised to be more compliant with medications and his appointments.

## 2018-07-31 NOTE — Progress Notes (Deleted)
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   02/10/2015 Imaging   CT CAP-  Mildly reduced size of left abdominal tumor implants. No new tumor implants identified.   03/14/2015 Procedure   Port-a-cath placed by Dr. Arnoldo Parks   03/14/2016 Imaging   CT CAP- 1. Continued reduction in size of left abdominal peritoneal metastases. No new or progressive metastatic disease in the abdomen or pelvis. 2. Solitary new irregular 1.0 cm nodular opacity in the anterior right upper lobe, highly nonspecific, potentially inflammatory, however cannot exclude pulmonary malignancy (metastatic or primary lung carcinoma). New nonspecific mild subcarinal and right hilar lymphadenopathy, cannot exclude new nodal metastases. PET-CT could be useful for further characterization / biopsy planning, as clinically warranted. Alternatively, a follow-up chest CT with IV contrast could be obtained in 3 months.   07/03/2016 Imaging   Ct chest- No findings specific for metastatic disease in the chest.  Small thoracic lymph nodes measuring up to 9 mm short axis, decreased.  No suspicious pulmonary nodules.      CANCER STAGING: Cancer Staging No matching staging information was found for the patient.   INTERVAL HISTORY:  [name] 58 y.o. male returns for routine follow-up and consideration for next cycle of chemotherapy.   Due for cycle #*** of *** today.   Overall, he tells me he has been feeling pretty well. Energy levels ***;  appetite ***.   Overall, he feels ready for next cycle of chemo today.     REVIEW OF SYSTEMS:  Review of Systems - Oncology   PAST MEDICAL/SURGICAL HISTORY:  Past Medical History:  Diagnosis Date  . Alcohol abuse   . Anemia   . Anxiety 09/04/2012  . GERD (gastroesophageal reflux disease)   . GIST (gastrointestinal stroma tumor), malignant, colon ([place])   . Hx of bipolar disorder   . Malignant GIST ([place]) 09/23/2014   initially diagnosed in 2014  . Pancreatitis   . PE (physical exam), annual   . Pulmonary nodule 05/29/2016  . Tobacco abuse   . Ventral hernia    Past Surgical History:  Procedure Laterality Date  . BIOPSY  04/29/2017   Procedure: BIOPSY;  Surgeon: Kenneth Dolin, MD;  [facility];  Service: Endoscopy;;  gastric and esophageal biopsy  . COLONOSCOPY WITH PROPOFOL [name] 04/29/2017   Procedure: COLONOSCOPY WITH PROPOFOL;  Surgeon: Kenneth Dolin, MD;  Location: [facility];  Service: Endoscopy;  Laterality: [name];  11:00am  . ESOPHAGOGASTRODUODENOSCOPY (EGD) WITH PROPOFOL [name] 04/29/2017   Procedure: ESOPHAGOGASTRODUODENOSCOPY (EGD) WITH PROPOFOL;  Surgeon: Kenneth Dolin, MD;  [facility]: [facility];  Service: Endoscopy;  Laterality: [name];  . [name] [name] 04/29/2017   Procedure: [name] DILATION;  Surgeon: Kenneth Dolin, MD;  [facility];  Service: Endoscopy;  Laterality: [name];  . PARTIAL COLECTOMY    . POLYPECTOMY  04/29/2017   Procedure: POLYPECTOMY;  Surgeon: Kenneth Dolin, MD;  [facility]: [facility];  Service: Endoscopy;;  hepatic flexure polyp  . PORTACATH PLACEMENT Right  03/14/2015   Procedure: INSERTION PORT-A-CATH;  Surgeon: Kenneth Signs, MD;  Location: [facility];  Service: General;  Laterality: Right;  . SMALL INTESTINE SURGERY     small bowel resection for obstruction     SOCIAL HISTORY:  Social History   Socioeconomic History  . Marital status: Single    Spouse name: Not on file  . Number of  children: Not on file  . Years of education: Not on file  . Highest education level: Not on file  Occupational History  . Not on file  Social Needs  . Financial resource strain: Not on file  . Food insecurity    Worry: Not on file    Inability: Not on file  . Transportation needs    Medical: Not on file    Non-medical: Not on file  Tobacco Use  . Smoking status: Current Every Day Smoker    Packs/day: 0.50    Years: 36.00    Pack years: 18.00    Types: Cigarettes  . Smokeless tobacco: Never Used  . Tobacco comment: refused smoking cessation materials.   Substance and Sexual Activity  . Alcohol use: Yes    Alcohol/week: 24.0 standard drinks    Types: 24 Cans of beer per week    Comment: 2 - 3 cans beer daily  . Drug use: Yes    Types: Cocaine    Comment: denied 03/29/17, none in 6-7 months. "occasional"   . Sexual activity: Not Currently  Lifestyle  . Physical activity    Days per week: Not on file    Minutes per session: Not on file  . Stress: Not on file  Relationships  . Social Herbalist on phone: Not on file    Gets together: Not on file    Attends religious service: Not on file    Active member of club or organization: Not on file    Attends meetings of clubs or organizations: Not on file    Relationship status: Not on file  . Intimate partner violence    Fear of current or ex partner: Not on file    Emotionally abused: Not on file    Physically abused: Not on file    Forced sexual activity: Not on file  Other Topics Concern  . Not on file  Social History Narrative  . Not on file    FAMILY HISTORY:  Family History  Problem Relation Age of Onset  . Cancer Mother   . Hypertension Father   . Cancer Brother   . Colon cancer Neg Hx     CURRENT MEDICATIONS:  Outpatient Encounter Medications as of 07/31/2018  Medication Sig  . BELBUCA 600 MCG FILM place ONE film UNDER THE TONGUE EVERY TWELVE HOURS FOR chronic pain  . busPIRone (BUSPAR) 15 MG  tablet Take by mouth.  . busPIRone (BUSPAR) 30 MG tablet Take 30 mg by mouth 2 (two) times daily.  . Calcium Carbonate (CALCIUM 600 PO) Take by mouth daily.  . Cholecalciferol (VITAMIN D3) 50 MCG (2000 UT) capsule Take 2,000 Units by mouth daily.  . cyanocobalamin 1000 MCG tablet Take 1 tablet (1,000 mcg total) by mouth daily.  . DULoxetine (CYMBALTA) 30 MG capsule Take 30 mg by mouth daily.  Marland Kitchen esomeprazole (NEXIUM) 20 MG capsule Take 20 mg by mouth 2 (two) times daily.  . furosemide (LASIX) 20 MG tablet Take 0.5 tablets (10 mg total) by mouth daily as needed.  . imatinib (GLEEVEC) 400 MG tablet Take  1 tablet (400 mg total) by mouth 2 (two) times daily.  Marland Kitchen lidocaine-prilocaine (EMLA) cream Apply 1 application topically as needed.  . Magnesium 250 MG TABS Take 250 mg by mouth 2 (two) times daily.  . methocarbamol (ROBAXIN) 500 MG tablet TAKE 1 TO 2 TABLETS BY MOUTH EVERY 8 HOURS AS NEEDED FOR SEVERE pain (MAY cause drowsiness)  . mirtazapine (REMERON) 45 MG tablet Take by mouth.  . oxyCODONE-acetaminophen (PERCOCET) 10-325 MG tablet TAKE 1/2 TABLET BY MOUTH EVERY 4 HOURS AS NEEDED FOR SEVERE PAIN. MAX TWO TABLETS PER DAY  . potassium chloride SA (K-DUR,KLOR-CON) 20 MEQ tablet Take 3 tablets (60 mEq total) by mouth daily.  . pregabalin (LYRICA) 150 MG capsule Take 150 mg by mouth 3 (three) times daily.  . pregabalin (LYRICA) 200 MG capsule Take 200 mg by mouth 3 (three) times daily.  . traZODone (DESYREL) 50 MG tablet Take 50 mg by mouth at bedtime.   No facility-administered encounter medications on file as of 07/31/2018.     ALLERGIES:  No Known Allergies   PHYSICAL EXAM:  ECOG Performance status: ***  There were no vitals filed for this visit. There were no vitals filed for this visit.  Physical Exam   LABORATORY DATA:  I have reviewed the labs as listed.  CBC    Component Value Date/Time   WBC 7.0 07/22/2018 1241   RBC 2.93 (L) 07/22/2018 1241   HGB 10.5 (L) 07/22/2018 1241    HCT 31.4 (L) 07/22/2018 1241   PLT 35 (L) 07/22/2018 1241   MCV 107.2 (H) 07/22/2018 1241   MCH 35.8 (H) 07/22/2018 1241   MCHC 33.4 07/22/2018 1241   RDW 14.9 07/22/2018 1241   LYMPHSABS 1.4 07/22/2018 1241   MONOABS 0.5 07/22/2018 1241   EOSABS 0.0 07/22/2018 1241   BASOSABS 0.1 07/22/2018 1241   CMP Latest Ref Rng & Units 07/22/2018 05/27/2018 04/30/2018  Glucose 70 - 99 mg/dL 121(H) 118(H) 78  BUN 6 - 20 mg/dL <5(L) 5(L) 6  Creatinine 0.61 - 1.24 mg/dL 0.83 0.85 0.77  Sodium 135 - 145 mmol/L 136 137 136  Potassium 3.5 - 5.1 mmol/L 3.9 4.1 3.4(L)  Chloride 98 - 111 mmol/L 101 104 103  CO2 22 - 32 mmol/L 21(L) 23 26  Calcium 8.9 - 10.3 mg/dL 7.3(L) 7.8(L) 7.8(L)  Total Protein 6.5 - 8.1 g/dL 6.0(L) 6.4(L) 6.3(L)  Total Bilirubin 0.3 - 1.2 mg/dL 1.0 0.6 0.5  Alkaline Phos 38 - 126 U/L 215(H) 103 87  AST 15 - 41 U/L 125(H) 54(H) 27  ALT 0 - 44 U/L 55(H) 20 16       DIAGNOSTIC IMAGING:  I have independently reviewed the scans and discussed with the patient.   I have reviewed Francene Finders, NP's note and agree with the documentation.  I personally performed a face-to-face visit, made revisions and my assessment and plan is as follows.    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 - Was started on [place] in March 2015. - He tolerated the Gleevec 400 mg daily very well with occasional nausea.  He denied any abdominal pains bloating or bowel habit changes.  Also denied any bleeding per rectum or melena. - He had a CT abdomen and pelvis on 01/29/2018 that showed 2 cavitary soft tissue nodules in the  left abdomen concerning for progression of disease.  The larger of the 2 lesions is contiguous with the descending colon and is a gas filled, with possible communication  with the colonic lumen.  This measures 3.3 cm is contiguous with adjacent small bowel and is at the location of 15 m nodule on prior scan from October 2018.  The smaller of the 2 lesions is more proximal along the descending colon measuring 1.2 x 1.7 cm. - He was on imatinib and it was increased to 800 mg daily.  He tolerated the lower dose well, however with the increased dose his platelet count dropped to 37, and the imatinib was held at this time. - He was started back on Gleevec 1 tablet daily on 03/05/2018 when his platelet count improved to 120.  He tolerated the Gleevec well at this dose. -His Gleevec was increased to 1 tablet twice daily on 03/26/2018. - Labs were drawn on 04/10/2018 and his platelet count was 83.  He did report he was only taken 1&1/2 pills daily.  He was re-educated about the importance of taking them 12 hours apart with food and will start taking 1 pill twice daily. -The pills are causing him some nausea he was told to take his nausea medication twice a day with his pills to see if that helped. - We will continue Gleevec as long as his platelet count stays above 50. -She is continuing to take B12 tablets daily.  His hemoglobin is 11.8 on 04/30/2018. -We recheck labs on 05/27/2018 his platelet count is stable at 122.  His hemoglobin is 12.1. -He reports he has been taking a half a pill in the morning and a whole pill at night.  He will start taking a whole pill in the morning and a whole pill at night starting on 05/01/2018. -He reports he has been taking 1 whole pill in the morning and 1 whole pill at night which he started on 05/01/2018.  Platelets are stable. -We will repeat CT scans in 2 to 3 months after he started taking the [facility] correctly. - We will see him back in 4 weeks with labs.  2.polysubstance abuse: - It is documented in his chart he has a history of EtOH and cocaine abuse with urine drug screen positive for cocaine and negative for opioids on multiple occasions.  -He is on buprenorphine buccal film. - He has promised to be more compliant with medications and his appointments.      Orders placed this encounter:  No orders of the defined types were placed in this encounter.     Derek Jack, MD [place] [phone]

## 2018-08-04 ENCOUNTER — Other Ambulatory Visit (HOSPITAL_COMMUNITY): Payer: Self-pay | Admitting: *Deleted

## 2018-08-05 ENCOUNTER — Other Ambulatory Visit (HOSPITAL_COMMUNITY): Payer: Self-pay | Admitting: *Deleted

## 2018-08-05 ENCOUNTER — Encounter (HOSPITAL_COMMUNITY): Payer: Medicaid Other

## 2018-08-05 ENCOUNTER — Other Ambulatory Visit (HOSPITAL_COMMUNITY): Payer: Medicaid Other

## 2018-08-05 ENCOUNTER — Ambulatory Visit (HOSPITAL_COMMUNITY): Payer: Medicaid Other | Admitting: Nurse Practitioner

## 2018-08-06 ENCOUNTER — Other Ambulatory Visit: Payer: Self-pay

## 2018-08-06 ENCOUNTER — Inpatient Hospital Stay (HOSPITAL_BASED_OUTPATIENT_CLINIC_OR_DEPARTMENT_OTHER): Payer: Medicaid Other | Admitting: Nurse Practitioner

## 2018-08-06 ENCOUNTER — Inpatient Hospital Stay (HOSPITAL_COMMUNITY): Payer: Medicaid Other

## 2018-08-06 DIAGNOSIS — F17210 Nicotine dependence, cigarettes, uncomplicated: Secondary | ICD-10-CM | POA: Diagnosis not present

## 2018-08-06 DIAGNOSIS — C49A3 Gastrointestinal stromal tumor of small intestine: Secondary | ICD-10-CM

## 2018-08-06 DIAGNOSIS — Z452 Encounter for adjustment and management of vascular access device: Secondary | ICD-10-CM | POA: Diagnosis not present

## 2018-08-06 DIAGNOSIS — Z79899 Other long term (current) drug therapy: Secondary | ICD-10-CM | POA: Diagnosis not present

## 2018-08-06 DIAGNOSIS — Z9049 Acquired absence of other specified parts of digestive tract: Secondary | ICD-10-CM | POA: Diagnosis not present

## 2018-08-06 LAB — COMPREHENSIVE METABOLIC PANEL
ALT: 42 U/L (ref 0–44)
AST: 109 U/L — ABNORMAL HIGH (ref 15–41)
Albumin: 2.2 g/dL — ABNORMAL LOW (ref 3.5–5.0)
Alkaline Phosphatase: 239 U/L — ABNORMAL HIGH (ref 38–126)
Anion gap: 18 — ABNORMAL HIGH (ref 5–15)
BUN: <5 mg/dL — ABNORMAL LOW (ref 6–20)
CO2: 22 mmol/L (ref 22–32)
Calcium: 7.3 mg/dL — ABNORMAL LOW (ref 8.9–10.3)
Chloride: 93 mmol/L — ABNORMAL LOW (ref 98–111)
Creatinine, Ser: 0.71 mg/dL (ref 0.61–1.24)
GFR calc Af Amer: >60 mL/min (ref >60)
GFR calc non Af Amer: >60 mL/min (ref >60)
Glucose, Bld: 119 mg/dL — ABNORMAL HIGH (ref 70–99)
Potassium: 3.1 mmol/L — ABNORMAL LOW (ref 3.5–5.1)
Sodium: 133 mmol/L — ABNORMAL LOW (ref 135–145)
Total Bilirubin: 0.9 mg/dL (ref 0.3–1.2)
Total Protein: 5.9 g/dL — ABNORMAL LOW (ref 6.5–8.1)

## 2018-08-06 LAB — CBC WITH DIFFERENTIAL/PLATELET
Abs Immature Granulocytes: 0.04 10*3/uL (ref 0.00–0.07)
Basophils Absolute: 0 10*3/uL (ref 0.0–0.1)
Basophils Relative: 1 %
Eosinophils Absolute: 0 10*3/uL (ref 0.0–0.5)
Eosinophils Relative: 0 %
HCT: 27.9 % — ABNORMAL LOW (ref 39.0–52.0)
Hemoglobin: 9.5 g/dL — ABNORMAL LOW (ref 13.0–17.0)
Immature Granulocytes: 1 %
Lymphocytes Relative: 20 %
Lymphs Abs: 1.3 10*3/uL (ref 0.7–4.0)
MCH: 36.7 pg — ABNORMAL HIGH (ref 26.0–34.0)
MCHC: 34.1 g/dL (ref 30.0–36.0)
MCV: 107.7 fL — ABNORMAL HIGH (ref 80.0–100.0)
Monocytes Absolute: 0.5 10*3/uL (ref 0.1–1.0)
Monocytes Relative: 8 %
Neutro Abs: 4.4 10*3/uL (ref 1.7–7.7)
Neutrophils Relative %: 70 %
Platelets: 47 10*3/uL — ABNORMAL LOW (ref 150–400)
RBC: 2.59 MIL/uL — ABNORMAL LOW (ref 4.22–5.81)
RDW: 14.9 % (ref 11.5–15.5)
WBC: 6.2 10*3/uL (ref 4.0–10.5)
nRBC: 0.3 % — ABNORMAL HIGH (ref 0.0–0.2)

## 2018-08-06 LAB — LACTATE DEHYDROGENASE: LDH: 245 U/L — ABNORMAL HIGH (ref 98–192)

## 2018-08-06 MED ORDER — SODIUM CHLORIDE 0.9% FLUSH
10.0000 mL | Freq: Once | INTRAVENOUS | Status: AC
  Administered 2018-08-06: 14:00:00 10 mL

## 2018-08-06 MED ORDER — HEPARIN SOD (PORK) LOCK FLUSH 100 UNIT/ML IV SOLN
500.0000 [IU] | Freq: Once | INTRAVENOUS | Status: AC
  Administered 2018-08-06: 14:00:00 500 [IU] via INTRAVENOUS

## 2018-08-06 NOTE — Progress Notes (Signed)
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   02/10/2015 Imaging   CT CAP-  Mildly reduced size of left abdominal tumor implants. No new tumor implants identified.   03/14/2015 Procedure   Port-a-cath placed by Dr. Arnoldo Morale   03/14/2016 Imaging   CT CAP- 1. Continued reduction in size of left abdominal peritoneal metastases. No new or progressive metastatic disease in the abdomen or pelvis. 2. Solitary new irregular 1.0 cm nodular opacity in the anterior right upper lobe, highly nonspecific, potentially inflammatory, however cannot exclude pulmonary malignancy (metastatic or primary lung carcinoma). New nonspecific mild subcarinal and right hilar lymphadenopathy, cannot exclude new nodal metastases. PET-CT could be useful for further characterization / biopsy planning, as clinically warranted. Alternatively, a follow-up chest CT with IV contrast could be obtained in 3 months.   07/03/2016 Imaging   Ct chest- No findings specific for metastatic disease in the chest.  Small thoracic lymph nodes measuring up to 9 mm short axis, decreased.  No suspicious pulmonary nodules.     INTERVAL HISTORY:  [name] 58 y.o. male returns for routine follow-up for GIST.  He reports he has been having some lower leg neuropathy and edema.  His PCP placed him on hydrochlorothiazide and has helped minimally.  He reports he is also taking Lyrica but only taking it 2 times a day instead of the prescribed 3 times a day.   He reports he will try to take it as prescribed and see if that helps his neuropathy. Denies any nausea, vomiting, or diarrhea. Denies any new pains. Had not noticed any recent bleeding such as epistaxis, hematuria or hematochezia. Denies recent chest pain on exertion, shortness of breath on minimal exertion, pre-syncopal episodes, or palpitations. Denies any numbness or tingling in hands or feet. Denies any recent fevers, infections, or recent hospitalizations. Patient reports appetite at 50 % and energy level at 50 %.  He reports he is eating well and maintaining his weight at this time.    REVIEW OF SYSTEMS:  Review of Systems  Constitutional: Positive for fatigue.  Cardiovascular: Positive for leg swelling.  All other systems reviewed and are negative.    PAST MEDICAL/SURGICAL HISTORY:  Past Medical History:  Diagnosis Date  . Alcohol abuse   . Anemia   . Anxiety 09/04/2012  . GERD (gastroesophageal reflux disease)   . GIST (gastrointestinal stroma tumor), malignant, colon ([place])   . Hx of bipolar disorder   . Malignant GIST ([place]) 09/23/2014   initially diagnosed in 2014  . Pancreatitis   . PE (physical exam), annual   . Pulmonary nodule 05/29/2016  . Tobacco abuse   . Ventral hernia    Past Surgical History:  Procedure Laterality Date  . BIOPSY  04/29/2017   Procedure: BIOPSY;  Surgeon: Daneil Dolin, MD;  [facility];  Service: Endoscopy;;  gastric and esophageal biopsy  . COLONOSCOPY WITH PROPOFOL [name] 04/29/2017   Procedure: COLONOSCOPY WITH PROPOFOL;  Surgeon: Daneil Dolin, MD;  [facility];  Service: Endoscopy;  Laterality:  [name];  11:00am  . ESOPHAGOGASTRODUODENOSCOPY (EGD) WITH PROPOFOL [name] 04/29/2017   Procedure: ESOPHAGOGASTRODUODENOSCOPY (EGD) WITH PROPOFOL;  Surgeon: Daneil Dolin, MD;  [facility];  Service: Endoscopy;  Laterality: [name];  . [place] [name] 04/29/2017   Procedure: Venia Minks DILATION;   Surgeon: Daneil Dolin, MD;  [facility];  Service: Endoscopy;  Laterality: [name];  . PARTIAL COLECTOMY    . POLYPECTOMY  04/29/2017   Procedure: POLYPECTOMY;  Surgeon: Daneil Dolin, MD;  [facility];  Service: Endoscopy;;  hepatic flexure polyp  . PORTACATH PLACEMENT Right 03/14/2015   Procedure: INSERTION PORT-A-CATH;  Surgeon: Aviva Signs, MD;  Location: [facility];  Service: General;  Laterality: Right;  . SMALL INTESTINE SURGERY     small bowel resection for obstruction     SOCIAL HISTORY:  Social History   Socioeconomic History  . Marital status: Single    Spouse name: Not on file  . Number of children: Not on file  . Years of education: Not on file  . Highest education level: Not on file  Occupational History  . Not on file  Social Needs  . Financial resource strain: Not on file  . Food insecurity    Worry: Not on file    Inability: Not on file  . Transportation needs    Medical: Not on file    Non-medical: Not on file  Tobacco Use  . Smoking status: Current Every Day Smoker    Packs/day: 0.50    Years: 36.00    Pack years: 18.00    Types: Cigarettes  . Smokeless tobacco: Never Used  . Tobacco comment: refused smoking cessation materials.   Substance and Sexual Activity  . Alcohol use: Yes    Alcohol/week: 24.0 standard drinks    Types: 24 Cans of beer per week    Comment: 2 - 3 cans beer daily  . Drug use: Yes    Types: Cocaine    Comment: denied 03/29/17, none in 6-7 months. "occasional"   . Sexual activity: Not Currently  Lifestyle  . Physical activity    Days per week: Not on file    Minutes per session: Not on file  . Stress: Not on file  Relationships  . Social Herbalist on phone: Not on file    Gets together: Not on file    Attends religious service: Not on file    Active member of club or organization: Not on file    Attends meetings of clubs or organizations: Not on file    Relationship status: Not on file  .  Intimate partner violence    Fear of current or ex partner: Not on file    Emotionally abused: Not on file    Physically abused: Not on file    Forced sexual activity: Not on file  Other Topics Concern  . Not on file  Social History Narrative  . Not on file    FAMILY HISTORY:  Family History  Problem Relation Age of Onset  . Cancer Mother   . Hypertension Father   . Cancer Brother   . Colon cancer Neg Hx     CURRENT MEDICATIONS:  Outpatient Encounter Medications as of 08/06/2018  Medication Sig  . BELBUCA 600 MCG FILM place ONE film UNDER THE TONGUE EVERY TWELVE HOURS FOR chronic pain  . busPIRone (BUSPAR) 30 MG tablet Take 30 mg by mouth 2 (two) times daily.  Marland Kitchen  Calcium Carbonate (CALCIUM 600 PO) Take by mouth daily.  . Cholecalciferol (VITAMIN D3) 50 MCG (2000 UT) capsule Take 2,000 Units by mouth daily.  . cyanocobalamin 1000 MCG tablet Take 1 tablet (1,000 mcg total) by mouth daily.  . DULoxetine (CYMBALTA) 30 MG capsule Take 30 mg by mouth daily.  Marland Kitchen esomeprazole (NEXIUM) 20 MG capsule Take 20 mg by mouth 2 (two) times daily.  . furosemide (LASIX) 20 MG tablet Take 0.5 tablets (10 mg total) by mouth daily as needed.  . hydrochlorothiazide (HYDRODIURIL) 12.5 MG tablet Take 12.5 mg by mouth daily.  Marland Kitchen imatinib (GLEEVEC) 400 MG tablet Take 1 tablet (400 mg total) by mouth 2 (two) times daily.  Marland Kitchen lidocaine-prilocaine (EMLA) cream Apply 1 application topically as needed.  . Magnesium 250 MG TABS Take 250 mg by mouth 2 (two) times daily.  . methocarbamol (ROBAXIN) 500 MG tablet TAKE 1 TO 2 TABLETS BY MOUTH EVERY 8 HOURS AS NEEDED FOR SEVERE pain (MAY cause drowsiness)  . mirtazapine (REMERON) 45 MG tablet Take by mouth.  . neomycin-polymyxin-hydrocortisone (CORTISPORIN) 3.5-10000-1 OTIC suspension PLACE FOUR DROPS IN THE AFFECTED EAR(S) THREE TIMES DAILY  . oxyCODONE-acetaminophen (PERCOCET) 10-325 MG tablet TAKE 1/2 TABLET BY MOUTH EVERY 4 HOURS AS NEEDED FOR SEVERE PAIN. MAX TWO  TABLETS PER DAY  . potassium chloride SA (K-DUR,KLOR-CON) 20 MEQ tablet Take 3 tablets (60 mEq total) by mouth daily.  . pregabalin (LYRICA) 150 MG capsule Take 150 mg by mouth 3 (three) times daily.  . pregabalin (LYRICA) 200 MG capsule Take 200 mg by mouth 3 (three) times daily.  . traZODone (DESYREL) 50 MG tablet Take 50 mg by mouth at bedtime.  . [DISCONTINUED] busPIRone (BUSPAR) 15 MG tablet Take by mouth.   No facility-administered encounter medications on file as of 08/06/2018.     ALLERGIES:  No Known Allergies   PHYSICAL EXAM:  ECOG Performance status: 1  Vitals:   08/06/18 1324  BP: 119/69  Pulse: 84  Resp: 14  Temp: 98 F (36.7 C)  SpO2: 98%   Filed Weights   08/06/18 1324  Weight: 179 lb 3.2 oz (81.3 kg)    Physical Exam Constitutional:      Appearance: Normal appearance. He is normal weight.  Cardiovascular:     Rate and Rhythm: Normal rate and regular rhythm.     Heart sounds: Normal heart sounds.  Pulmonary:     Effort: Pulmonary effort is normal.     Breath sounds: Normal breath sounds.  Abdominal:     General: Bowel sounds are normal.     Palpations: Abdomen is soft.  Musculoskeletal: Normal range of motion.  Skin:    General: Skin is warm and dry.  Neurological:     Mental Status: He is alert and oriented to person, place, and time. Mental status is at baseline.  Psychiatric:        Mood and Affect: Mood normal.        Behavior: Behavior normal.        Thought Content: Thought content normal.        Judgment: Judgment normal.      LABORATORY DATA:  I have reviewed the labs as listed.  CBC    Component Value Date/Time   WBC 6.2 08/06/2018 1215   RBC 2.59 (L) 08/06/2018 1215   HGB 9.5 (L) 08/06/2018 1215   HCT 27.9 (L) 08/06/2018 1215   PLT 47 (L) 08/06/2018 1215   MCV 107.7 (H) 08/06/2018 1215   MCH  36.7 (H) 08/06/2018 1215   MCHC 34.1 08/06/2018 1215   RDW 14.9 08/06/2018 1215   LYMPHSABS 1.3 08/06/2018 1215   MONOABS 0.5  08/06/2018 1215   EOSABS 0.0 08/06/2018 1215   BASOSABS 0.0 08/06/2018 1215   CMP Latest Ref Rng & Units 08/06/2018 07/22/2018 05/27/2018  Glucose 70 - 99 mg/dL 119(H) 121(H) 118(H)  BUN 6 - 20 mg/dL <5(L) <5(L) 5(L)  Creatinine 0.61 - 1.24 mg/dL 0.71 0.83 0.85  Sodium 135 - 145 mmol/L 133(L) 136 137  Potassium 3.5 - 5.1 mmol/L 3.1(L) 3.9 4.1  Chloride 98 - 111 mmol/L 93(L) 101 104  CO2 22 - 32 mmol/L 22 21(L) 23  Calcium 8.9 - 10.3 mg/dL 7.3(L) 7.3(L) 7.8(L)  Total Protein 6.5 - 8.1 g/dL 5.9(L) 6.0(L) 6.4(L)  Total Bilirubin 0.3 - 1.2 mg/dL 0.9 1.0 0.6  Alkaline Phos 38 - 126 U/L 239(H) 215(H) 103  AST 15 - 41 U/L 109(H) 125(H) 54(H)  ALT 0 - 44 U/L 42 55(H) 20     I personally performed a face-to-face visit, made revisions and my assessment and plan is as follows.    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 - Was started on [place] in March 2015. - He tolerated the Gleevec 400 mg daily very well with occasional nausea.  He denied any abdominal pains bloating or bowel habit changes.  Also denied any bleeding per rectum or melena. - He had a CT abdomen and pelvis on 01/29/2018 that showed 2 cavitary soft tissue nodules in the left abdomen concerning for progression of disease.  The larger of the 2 lesions is contiguous with the descending colon and is a gas filled, with possible communication with the colonic lumen.  This measures 3.3 cm is contiguous with adjacent small bowel and is at the location of 15 m nodule on prior scan from October 2018.  The smaller of the 2 lesions is more proximal along the descending colon measuring 1.2 x 1.7 cm. - He was on imatinib and it was increased to 800 mg daily.  He tolerated the lower dose well, however with the increased dose his  platelet count dropped to 37, and the imatinib was held at this time. - He was started back on Gleevec 1 tablet daily on 03/05/2018 when his platelet count improved to 120.  He tolerated the Gleevec well at this dose. -His Gleevec was increased to 1 tablet twice daily on 03/26/2018. - Labs were drawn on 04/10/2018 and his platelet count was 83.  He did report he was only taken 1&1/2 pills daily.  He was re-educated about the importance of taking them 12 hours apart with food and will start taking 1 pill twice daily. -The pills are causing him some nausea he was told to take his nausea medication twice a day with his pills to see if that helped. - We will continue Gleevec as long as his platelet count stays above 50. -She is continuing to take B12 tablets daily.  His hemoglobin is 11.8 on 04/30/2018. -We recheck labs on 05/27/2018 his platelet count is stable at 122.  His hemoglobin is 12.1. -He reports he has been taking a half a pill in the morning and a whole pill at night.  He will start taking a whole pill in the morning and  a whole pill at night starting on 05/01/2018. -He reports he has been taking 1 whole pill in the morning and 1 whole pill at night which he started on 05/01/2018.  Platelets are stable. -We will repeat CT scans in 2 to 3 months after he started taking the [place] correctly. -Labs on 07/22/2018 showed his platelet count at 35. -Labs on 08/06/2018 showed his platelet count increased to 47. -We will see him back in 3 weeks if platelet count has increased to over 50 we will begin the [place] back at a reduced dose. - We will see him back in 3 weeks with labs.  2.polysubstance abuse: - It is documented in his chart he has a history of EtOH and cocaine abuse with urine drug screen positive for cocaine and negative for opioids on multiple occasions. -He is on buprenorphine buccal film. - He has promised to be more compliant with medications and his appointments.      Orders placed  this encounter:  Orders Placed This Encounter  Procedures  . Lactate dehydrogenase  . CBC with Differential/Platelet  . Comprehensive metabolic panel    Francene Finders, FNP-C [place] [phone]

## 2018-08-06 NOTE — Assessment & Plan Note (Signed)
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 - Was started on [place] in March 2015. - He tolerated the Gleevec 400 mg daily very well with occasional nausea.  He denied any abdominal pains bloating or bowel habit changes.  Also denied any bleeding per rectum or melena. - He had a CT abdomen and pelvis on 01/29/2018 that showed 2 cavitary soft tissue nodules in the left abdomen concerning for progression of disease.  The larger of the 2 lesions is contiguous with the descending colon and is a gas filled, with possible communication with the colonic lumen.  This measures 3.3 cm is contiguous with adjacent small bowel and is at the location of 15 m nodule on prior scan from October 2018.  The smaller of the 2 lesions is more proximal along the descending colon measuring 1.2 x 1.7 cm. - He was on imatinib and it was increased to 800 mg daily.  He tolerated the lower dose well, however with the increased dose his platelet count dropped to 37, and the imatinib was held at this time. - He was started back on Gleevec 1 tablet daily on 03/05/2018 when his platelet count improved to 120.  He tolerated the Gleevec well at this dose. -His Gleevec was increased to 1 tablet twice daily on 03/26/2018. - Labs were drawn on 04/10/2018 and his platelet count was 83.  He did report he was only taken 1&1/2 pills daily.  He was re-educated about the importance of taking them 12 hours apart with food and will start taking 1 pill twice daily. -The pills are causing him some nausea he was told to take his nausea medication twice a day with his pills to see if that helped. - We will continue Gleevec as long as his platelet count stays above 50. -She is continuing to take B12 tablets daily.  His hemoglobin is 11.8 on 04/30/2018. -We recheck labs on 05/27/2018 his platelet count is stable at 122.  His hemoglobin is  12.1. -He reports he has been taking a half a pill in the morning and a whole pill at night.  He will start taking a whole pill in the morning and a whole pill at night starting on 05/01/2018. -He reports he has been taking 1 whole pill in the morning and 1 whole pill at night which he started on 05/01/2018.  Platelets are stable. -We will repeat CT scans in 2 to 3 months after he started taking the [place] correctly. -Labs on 07/22/2018 showed his platelet count at 35. -Labs on 08/06/2018 showed his platelet count increased to 47. -We will see him back in 3 weeks if platelet count has increased to over 50 we will begin the [place] back at a reduced dose. - We will see him back in 3 weeks with labs.  2.polysubstance abuse: - It is documented in his chart he has a history of EtOH and cocaine abuse with urine drug screen positive for cocaine and negative for opioids on multiple occasions. -He is on buprenorphine buccal film. - He has promised to be more compliant with medications and his appointments.

## 2018-08-06 NOTE — Progress Notes (Signed)
[name] presented for Portacath access and flush. Portacath located in the right chest wall accessed with  H 20 needle. Clean, Dry and Intact. Blood return present.  Portacath flushed with 4ml NS and 500U/65ml Heparin per protocol and needle removed intact. Procedure without incident. Patient tolerated procedure well.

## 2018-08-06 NOTE — Patient Instructions (Signed)
[facility] at [facility] Discharge Instructions  Follow-up in 3 weeks with repeat labs.   Thank you for choosing [facility] at [facility] to provide your oncology and hematology care.  To afford each patient quality time with our provider, please arrive at least 15 minutes before your scheduled appointment time.   If you have a lab appointment with the Cancer Center please come in thru the  Main Entrance and check in at the main information desk  You need to re-schedule your appointment should you arrive 10 or more minutes late.  We strive to give you quality time with our providers, and arriving late affects you and other patients whose appointments are after yours.  Also, if you no show three or more times for appointments you may be dismissed from the clinic at the providers discretion.     Again, thank you for choosing [facility].  Our hope is that these requests will decrease the amount of time that you wait before being seen by our physicians.       _____________________________________________________________  Should you have questions after your visit to [facility], please contact our office at [phone] between the hours of 8:00 a.m. and 4:30 p.m.  Voicemails left after 4:00 p.m. will not be returned until the following business day.  For prescription refill requests, have your pharmacy contact our office and allow 72 hours.    Cancer Center Support Programs:   > Cancer Support Group  2nd Tuesday of the month 1pm-2pm, Journey Room    

## 2018-08-12 ENCOUNTER — Telehealth (HOSPITAL_COMMUNITY): Payer: Self-pay | Admitting: Emergency Medicine

## 2018-08-12 NOTE — Telephone Encounter (Signed)
Patients sister called for pt. He is c/o of increased leg burring/pain. This is due to his neuropathy. He went to his pcp yesterday who prescribed gabapentin. I explained that he must give this medication a try. He is usually non complaint w/ his medications. She verbalized understanding and states she will make sure he takes his medications as prescribed.

## 2018-08-28 ENCOUNTER — Other Ambulatory Visit (HOSPITAL_COMMUNITY): Payer: Medicaid Other

## 2018-08-28 ENCOUNTER — Ambulatory Visit (HOSPITAL_COMMUNITY): Payer: Medicaid Other | Admitting: Nurse Practitioner

## 2018-08-28 NOTE — Assessment & Plan Note (Deleted)
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 - Was started on [place] in March 2015. - He tolerated the Gleevec 400 mg daily very well with occasional nausea.  He denied any abdominal pains bloating or bowel habit changes.  Also denied any bleeding per rectum or melena. - He had a CT abdomen and pelvis on 01/29/2018 that showed 2 cavitary soft tissue nodules in the left abdomen concerning for progression of disease.  The larger of the 2 lesions is contiguous with the descending colon and is a gas filled, with possible communication with the colonic lumen.  This measures 3.3 cm is contiguous with adjacent small bowel and is at the location of 15 m nodule on prior scan from October 2018.  The smaller of the 2 lesions is more proximal along the descending colon measuring 1.2 x 1.7 cm. - He was on imatinib and it was increased to 800 mg daily.  He tolerated the lower dose well, however with the increased dose his platelet count dropped to 37, and the imatinib was held at this time. - He was started back on Gleevec 1 tablet daily on 03/05/2018 when his platelet count improved to 120.  He tolerated the Gleevec well at this dose. -His Gleevec was increased to 1 tablet twice daily on 03/26/2018. - Labs were drawn on 04/10/2018 and his platelet count was 83.  He did report he was only taken 1&1/2 pills daily.  He was re-educated about the importance of taking them 12 hours apart with food and will start taking 1 pill twice daily. -The pills are causing him some nausea he was told to take his nausea medication twice a day with his pills to see if that helped. - We will continue Gleevec as long as his platelet count stays above 50. -She is continuing to take B12 tablets daily.  His hemoglobin is 11.8 on 04/30/2018. -We recheck labs on 05/27/2018 his platelet count is stable at 122.  His hemoglobin is  12.1. -He reports he has been taking a half a pill in the morning and a whole pill at night.  He will start taking a whole pill in the morning and a whole pill at night starting on 05/01/2018. -He reports he has been taking 1 whole pill in the morning and 1 whole pill at night which he started on 05/01/2018.  Platelets are stable. -We will repeat CT scans in 2 to 3 months after he started taking the [place] correctly. -Labs on 07/22/2018 showed his platelet count at 35. -Labs on 08/06/2018 showed his platelet count increased to 47. -We will see him back in 3 weeks if platelet count has increased to over 50 we will begin the [place] back at a reduced dose. - We will see him back in 3 weeks with labs.  2.polysubstance abuse: - It is documented in his chart he has a history of EtOH and cocaine abuse with urine drug screen positive for cocaine and negative for opioids on multiple occasions. -He is on buprenorphine buccal film. - He has promised to be more compliant with medications and his appointments.

## 2018-08-28 NOTE — Progress Notes (Deleted)
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   02/10/2015 Imaging   CT CAP-  Mildly reduced size of left abdominal tumor implants. No new tumor implants identified.   03/14/2015 Procedure   Port-a-cath placed by Dr. Arnoldo Morale   03/14/2016 Imaging   CT CAP- 1. Continued reduction in size of left abdominal peritoneal metastases. No new or progressive metastatic disease in the abdomen or pelvis. 2. Solitary new irregular 1.0 cm nodular opacity in the anterior right upper lobe, highly nonspecific, potentially inflammatory, however cannot exclude pulmonary malignancy (metastatic or primary lung carcinoma). New nonspecific mild subcarinal and right hilar lymphadenopathy, cannot exclude new nodal metastases. PET-CT could be useful for further characterization / biopsy planning, as clinically warranted. Alternatively, a follow-up chest CT with IV contrast could be obtained in 3 months.   07/03/2016 Imaging   Ct chest- No findings specific for metastatic disease in the chest.  Small thoracic lymph nodes measuring up to 9 mm short axis, decreased.  No suspicious pulmonary nodules.      INTERVAL HISTORY:  [name] 58 y.o. male returns for routine follow-up     REVIEW OF SYSTEMS:  Review of Systems - Oncology   PAST MEDICAL/SURGICAL HISTORY:  Past Medical History:  Diagnosis Date  . Alcohol abuse   . Anemia   . Anxiety 09/04/2012  . GERD (gastroesophageal reflux disease)   . GIST (gastrointestinal stroma  tumor), malignant, colon ([facility])   . Hx of bipolar disorder   . Malignant GIST ([facility]) 09/23/2014   initially diagnosed in 2014  . Pancreatitis   . PE (physical exam), annual   . Pulmonary nodule 05/29/2016  . Tobacco abuse   . Ventral hernia    Past Surgical History:  Procedure Laterality Date  . BIOPSY  04/29/2017   Procedure: BIOPSY;  Surgeon: Daneil Dolin, MD;  [facility];  Service: Endoscopy;;  gastric and esophageal biopsy  . COLONOSCOPY WITH PROPOFOL [name] 04/29/2017   Procedure: COLONOSCOPY WITH PROPOFOL;  Surgeon: Daneil Dolin, MD;  [facility];  Service: Endoscopy;  Laterality: [name];  11:00am  . ESOPHAGOGASTRODUODENOSCOPY (EGD) WITH PROPOFOL [name] 04/29/2017   Procedure: ESOPHAGOGASTRODUODENOSCOPY (EGD) WITH PROPOFOL;  Surgeon: Daneil Dolin, MD;  [facility];  Service: Endoscopy;  Laterality: [name];  . [place] [name] 04/29/2017   Procedure: Venia Minks DILATION;  Surgeon: Daneil Dolin, MD;  [facility];  Service: Endoscopy;  Laterality: [name];  . PARTIAL COLECTOMY    . POLYPECTOMY  04/29/2017   Procedure: POLYPECTOMY;  Surgeon: Daneil Dolin, MD;  [facility];  Service: Endoscopy;;  hepatic flexure polyp  . PORTACATH PLACEMENT Right 03/14/2015   Procedure: INSERTION PORT-A-CATH;  Surgeon: Aviva Signs, MD;  Location: [facility];  Service: General;  Laterality: Right;  . SMALL INTESTINE SURGERY     small bowel resection for obstruction     SOCIAL HISTORY:  Social History   Socioeconomic History  . Marital status: Single    Spouse name: Not on  file  . Number of children: Not on file  . Years of education: Not on file  . Highest education level: Not on file  Occupational History  . Not on file  Social Needs  . Financial resource strain: Not on file  . Food insecurity    Worry: Not on file    Inability: Not on file  . Transportation needs    Medical: Not on file    Non-medical: Not on file   Tobacco Use  . Smoking status: Current Every Day Smoker    Packs/day: 0.50    Years: 36.00    Pack years: 18.00    Types: Cigarettes  . Smokeless tobacco: Never Used  . Tobacco comment: refused smoking cessation materials.   Substance and Sexual Activity  . Alcohol use: Yes    Alcohol/week: 24.0 standard drinks    Types: 24 Cans of beer per week    Comment: 2 - 3 cans beer daily  . Drug use: Yes    Types: Cocaine    Comment: denied 03/29/17, none in 6-7 months. "occasional"   . Sexual activity: Not Currently  Lifestyle  . Physical activity    Days per week: Not on file    Minutes per session: Not on file  . Stress: Not on file  Relationships  . Social Herbalist on phone: Not on file    Gets together: Not on file    Attends religious service: Not on file    Active member of club or organization: Not on file    Attends meetings of clubs or organizations: Not on file    Relationship status: Not on file  . Intimate partner violence    Fear of current or ex partner: Not on file    Emotionally abused: Not on file    Physically abused: Not on file    Forced sexual activity: Not on file  Other Topics Concern  . Not on file  Social History Narrative  . Not on file    FAMILY HISTORY:  Family History  Problem Relation Age of Onset  . Cancer Mother   . Hypertension Father   . Cancer Brother   . Colon cancer Neg Hx     CURRENT MEDICATIONS:  Outpatient Encounter Medications as of 08/28/2018  Medication Sig  . BELBUCA 600 MCG FILM place ONE film UNDER THE TONGUE EVERY TWELVE HOURS FOR chronic pain  . busPIRone (BUSPAR) 30 MG tablet Take 30 mg by mouth 2 (two) times daily.  . Calcium Carbonate (CALCIUM 600 PO) Take by mouth daily.  . Cholecalciferol (VITAMIN D3) 50 MCG (2000 UT) capsule Take 2,000 Units by mouth daily.  . cyanocobalamin 1000 MCG tablet Take 1 tablet (1,000 mcg total) by mouth daily.  . DULoxetine (CYMBALTA) 30 MG capsule Take 30 mg by mouth daily.   Marland Kitchen esomeprazole (NEXIUM) 20 MG capsule Take 20 mg by mouth 2 (two) times daily.  . furosemide (LASIX) 20 MG tablet Take 0.5 tablets (10 mg total) by mouth daily as needed.  . hydrochlorothiazide (HYDRODIURIL) 12.5 MG tablet Take 12.5 mg by mouth daily.  Marland Kitchen imatinib (GLEEVEC) 400 MG tablet Take 1 tablet (400 mg total) by mouth 2 (two) times daily.  Marland Kitchen lidocaine-prilocaine (EMLA) cream Apply 1 application topically as needed.  . Magnesium 250 MG TABS Take 250 mg by mouth 2 (two) times daily.  . methocarbamol (ROBAXIN) 500 MG tablet TAKE 1 TO 2 TABLETS BY MOUTH EVERY 8 HOURS AS NEEDED FOR  SEVERE pain (MAY cause drowsiness)  . mirtazapine (REMERON) 45 MG tablet Take by mouth.  . neomycin-polymyxin-hydrocortisone (CORTISPORIN) 3.5-10000-1 OTIC suspension PLACE FOUR DROPS IN THE AFFECTED EAR(S) THREE TIMES DAILY  . oxyCODONE-acetaminophen (PERCOCET) 10-325 MG tablet TAKE 1/2 TABLET BY MOUTH EVERY 4 HOURS AS NEEDED FOR SEVERE PAIN. MAX TWO TABLETS PER DAY  . potassium chloride SA (K-DUR,KLOR-CON) 20 MEQ tablet Take 3 tablets (60 mEq total) by mouth daily.  . pregabalin (LYRICA) 150 MG capsule Take 150 mg by mouth 3 (three) times daily.  . pregabalin (LYRICA) 200 MG capsule Take 200 mg by mouth 3 (three) times daily.  . traZODone (DESYREL) 50 MG tablet Take 50 mg by mouth at bedtime.   No facility-administered encounter medications on file as of 08/28/2018.     ALLERGIES:  No Known Allergies   PHYSICAL EXAM:  ECOG Performance status: 1  There were no vitals filed for this visit. There were no vitals filed for this visit.  Physical Exam   LABORATORY DATA:  I have reviewed the labs as listed.  CBC    Component Value Date/Time   WBC 6.2 08/06/2018 1215   RBC 2.59 (L) 08/06/2018 1215   HGB 9.5 (L) 08/06/2018 1215   HCT 27.9 (L) 08/06/2018 1215   PLT 47 (L) 08/06/2018 1215   MCV 107.7 (H) 08/06/2018 1215   MCH 36.7 (H) 08/06/2018 1215   MCHC 34.1 08/06/2018 1215   RDW 14.9 08/06/2018 1215    LYMPHSABS 1.3 08/06/2018 1215   MONOABS 0.5 08/06/2018 1215   EOSABS 0.0 08/06/2018 1215   BASOSABS 0.0 08/06/2018 1215   CMP Latest Ref Rng & Units 08/06/2018 07/22/2018 05/27/2018  Glucose 70 - 99 mg/dL 119(H) 121(H) 118(H)  BUN 6 - 20 mg/dL <5(L) <5(L) 5(L)  Creatinine 0.61 - 1.24 mg/dL 0.71 0.83 0.85  Sodium 135 - 145 mmol/L 133(L) 136 137  Potassium 3.5 - 5.1 mmol/L 3.1(L) 3.9 4.1  Chloride 98 - 111 mmol/L 93(L) 101 104  CO2 22 - 32 mmol/L 22 21(L) 23  Calcium 8.9 - 10.3 mg/dL 7.3(L) 7.3(L) 7.8(L)  Total Protein 6.5 - 8.1 g/dL 5.9(L) 6.0(L) 6.4(L)  Total Bilirubin 0.3 - 1.2 mg/dL 0.9 1.0 0.6  Alkaline Phos 38 - 126 U/L 239(H) 215(H) 103  AST 15 - 41 U/L 109(H) 125(H) 54(H)  ALT 0 - 44 U/L 42 55(H) 20       DIAGNOSTIC IMAGING:  I have independently reviewed the scans and discussed with the patient.   I have reviewed Francene Finders, NP's note and agree with the documentation.  I personally performed a face-to-face visit, made revisions and my assessment and plan is as follows.    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 - Was started on [place] in March 2015. - He tolerated the Gleevec 400 mg daily very well with occasional nausea.  He denied any abdominal pains bloating or bowel habit changes.  Also denied any bleeding per rectum or melena. - He had a CT abdomen and pelvis on 01/29/2018 that showed 2 cavitary soft tissue nodules in the left abdomen concerning for progression of disease.  The larger of the 2 lesions is contiguous with the descending colon and is a gas filled, with possible communication with the colonic lumen.  This measures 3.3 cm is contiguous with  adjacent small bowel and is at the location of 15 m nodule on prior scan from October 2018.  The  smaller of the 2 lesions is more proximal along the descending colon measuring 1.2 x 1.7 cm. - He was on imatinib and it was increased to 800 mg daily.  He tolerated the lower dose well, however with the increased dose his platelet count dropped to 37, and the imatinib was held at this time. - He was started back on Gleevec 1 tablet daily on 03/05/2018 when his platelet count improved to 120.  He tolerated the Gleevec well at this dose. -His Gleevec was increased to 1 tablet twice daily on 03/26/2018. - Labs were drawn on 04/10/2018 and his platelet count was 83.  He did report he was only taken 1&1/2 pills daily.  He was re-educated about the importance of taking them 12 hours apart with food and will start taking 1 pill twice daily. -The pills are causing him some nausea he was told to take his nausea medication twice a day with his pills to see if that helped. - We will continue Gleevec as long as his platelet count stays above 50. -She is continuing to take B12 tablets daily.  His hemoglobin is 11.8 on 04/30/2018. -We recheck labs on 05/27/2018 his platelet count is stable at 122.  His hemoglobin is 12.1. -He reports he has been taking a half a pill in the morning and a whole pill at night.  He will start taking a whole pill in the morning and a whole pill at night starting on 05/01/2018. -He reports he has been taking 1 whole pill in the morning and 1 whole pill at night which he started on 05/01/2018.  Platelets are stable. -We will repeat CT scans in 2 to 3 months after he started taking the [place] correctly. -Labs on 07/22/2018 showed his platelet count at 35. -Labs on 08/06/2018 showed his platelet count increased to 47. -We will see him back in 3 weeks if platelet count has increased to over 50 we will begin the [place] back at a reduced dose. - We will see him back in 3 weeks with labs.  2.polysubstance abuse: - It is documented in his chart he has a history of EtOH and cocaine abuse with urine  drug screen positive for cocaine and negative for opioids on multiple occasions. -He is on buprenorphine buccal film. - He has promised to be more compliant with medications and his appointments.      Orders placed this encounter:  No orders of the defined types were placed in this encounter.     Francene Finders, FNP-C [place] [phone]

## 2018-09-04 ENCOUNTER — Encounter (HOSPITAL_COMMUNITY): Payer: Self-pay | Admitting: Nurse Practitioner

## 2018-09-04 ENCOUNTER — Inpatient Hospital Stay (HOSPITAL_BASED_OUTPATIENT_CLINIC_OR_DEPARTMENT_OTHER): Payer: Medicaid Other | Admitting: Nurse Practitioner

## 2018-09-04 ENCOUNTER — Other Ambulatory Visit: Payer: Self-pay

## 2018-09-04 ENCOUNTER — Inpatient Hospital Stay (HOSPITAL_COMMUNITY): Payer: Medicaid Other | Attending: Internal Medicine

## 2018-09-04 DIAGNOSIS — C49A3 Gastrointestinal stromal tumor of small intestine: Secondary | ICD-10-CM | POA: Insufficient documentation

## 2018-09-04 LAB — COMPREHENSIVE METABOLIC PANEL
ALT: 63 U/L — ABNORMAL HIGH (ref 0–44)
AST: 161 U/L — ABNORMAL HIGH (ref 15–41)
Albumin: 2.1 g/dL — ABNORMAL LOW (ref 3.5–5.0)
Alkaline Phosphatase: 223 U/L — ABNORMAL HIGH (ref 38–126)
Anion gap: 10 (ref 5–15)
BUN: <5 mg/dL — ABNORMAL LOW (ref 6–20)
CO2: 26 mmol/L (ref 22–32)
Calcium: 7.8 mg/dL — ABNORMAL LOW (ref 8.9–10.3)
Chloride: 98 mmol/L (ref 98–111)
Creatinine, Ser: 0.69 mg/dL (ref 0.61–1.24)
GFR calc Af Amer: >60 mL/min (ref >60)
GFR calc non Af Amer: >60 mL/min (ref >60)
Glucose, Bld: 122 mg/dL — ABNORMAL HIGH (ref 70–99)
Potassium: 4.1 mmol/L (ref 3.5–5.1)
Sodium: 134 mmol/L — ABNORMAL LOW (ref 135–145)
Total Bilirubin: 0.9 mg/dL (ref 0.3–1.2)
Total Protein: 5.8 g/dL — ABNORMAL LOW (ref 6.5–8.1)

## 2018-09-04 LAB — CBC WITH DIFFERENTIAL/PLATELET
Band Neutrophils: 0 %
Basophils Absolute: 0.1 10*3/uL (ref 0.0–0.1)
Basophils Relative: 1 %
Blasts: 0 %
Eosinophils Absolute: 0 10*3/uL (ref 0.0–0.5)
Eosinophils Relative: 0 %
HCT: 31.5 % — ABNORMAL LOW (ref 39.0–52.0)
Hemoglobin: 10.2 g/dL — ABNORMAL LOW (ref 13.0–17.0)
Lymphocytes Relative: 16 %
Lymphs Abs: 1.3 10*3/uL (ref 0.7–4.0)
MCH: 35.3 pg — ABNORMAL HIGH (ref 26.0–34.0)
MCHC: 32.4 g/dL (ref 30.0–36.0)
MCV: 109 fL — ABNORMAL HIGH (ref 80.0–100.0)
Metamyelocytes Relative: 0 %
Monocytes Absolute: 0.8 10*3/uL (ref 0.1–1.0)
Monocytes Relative: 10 %
Myelocytes: 0 %
Neutro Abs: 6 10*3/uL (ref 1.7–7.7)
Neutrophils Relative %: 73 %
Other: 0 %
Platelets: 113 10*3/uL — ABNORMAL LOW (ref 150–400)
Promyelocytes Relative: 0 %
RBC: 2.89 MIL/uL — ABNORMAL LOW (ref 4.22–5.81)
RDW: 12.6 % (ref 11.5–15.5)
WBC: 8.2 10*3/uL (ref 4.0–10.5)
nRBC: 0 % (ref 0.0–0.2)
nRBC: 0 /100 WBC

## 2018-09-04 LAB — LACTATE DEHYDROGENASE: LDH: 216 U/L — ABNORMAL HIGH (ref 98–192)

## 2018-09-04 MED ORDER — IMATINIB MESYLATE 100 MG PO TABS: 100.0000 mg | ORAL_TABLET | Freq: Two times a day (BID) | ORAL | 3 refills | Status: DC

## 2018-09-04 NOTE — Patient Instructions (Signed)
[place] at [facility]  Discharge Instructions:  You had a telephone visit with Francene Finders today. _______________________________________________________________  Thank you for choosing [place] at [facility] to provide your oncology and hematology care.  To afford each patient quality time with our providers, please arrive at least 15 minutes before your scheduled appointment.  You need to re-schedule your appointment if you arrive 10 or more minutes late.  We strive to give you quality time with our providers, and arriving late affects you and other patients whose appointments are after yours.  Also, if you no show three or more times for appointments you may be dismissed from the clinic.  Again, thank you for choosing [place] at [place] hope is that these requests will allow you access to exceptional care and in a timely manner. _______________________________________________________________  If you have questions after your visit, please contact our office at [phone] between the hours of 8:30 a.m. and 5:00 p.m. Voicemails left after 4:30 p.m. will not be returned until the following business day. _______________________________________________________________  For prescription refill requests, have your pharmacy contact our office. _______________________________________________________________  Recommendations made by the consultant and any test results will be sent to your referring physician. _______________________________________________________________

## 2018-09-04 NOTE — Progress Notes (Signed)
Virtual Visit via Telephone Note  I connected with [name] on 09/04/18 at 11:30 AM EDT by telephone and verified that I am speaking with the correct person using two identifiers.   I discussed the limitations, risks, security and privacy concerns of performing an evaluation and management service by telephone and the availability of in person appointments. I also discussed with the patient that there may be a patient responsible charge related to this service. The patient expressed understanding and agreed to proceed.   History of Present Illness: Patient reports he is feeling fatigued and having lower leg edema.  He reports the Lasix is helping his legs.  He also has nausea from time to time. Denies any nausea, vomiting, or diarrhea. Denies any new pains. Had not noticed any recent bleeding such as epistaxis, hematuria or hematochezia. Denies recent chest pain on exertion, shortness of breath on minimal exertion, pre-syncopal episodes, or palpitations. Denies any numbness or tingling in hands or feet. Denies any recent fevers, infections, or recent hospitalizations. Patient reports appetite at 100% and energy level at 50%.    Observations/Objective: -Patient seemed alert and oriented and in no distress over the phone. -Physical assessment deferred due to telephone visit.  Assessment and Plan: 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 - Was started on [place] in March 2015. - He tolerated the Gleevec 400 mg daily very well with occasional nausea.  He denied any abdominal pains bloating or bowel habit changes.  Also denied any bleeding per rectum or melena. - He had a CT abdomen and pelvis on 01/29/2018 that showed 2 cavitary soft tissue nodules in the left abdomen concerning for progression of disease.  The larger of the 2 lesions is contiguous with the descending colon  and is a gas filled, with possible communication with the colonic lumen.  This measures 3.3 cm is contiguous with adjacent small bowel and is at the location of 15 m nodule on prior scan from October 2018.  The smaller of the 2 lesions is more proximal along the descending colon measuring 1.2 x 1.7 cm. - He was on imatinib and it was increased to 800 mg daily.  He tolerated the lower dose well, however with the increased dose his platelet count dropped to 37, and the imatinib was held at this time. - He was started back on Gleevec 1 tablet daily on 03/05/2018 when his platelet count improved to 120.  He tolerated the Gleevec well at this dose. -His Gleevec was increased to 1 tablet twice daily on 03/26/2018. - Labs were drawn on 04/10/2018 and his platelet count was 83.  He did report he was only taken 1&1/2 pills daily.  He was re-educated about the importance of taking them 12 hours apart with food and will start taking 1 pill twice daily. -The pills are causing him some nausea he was told to take his nausea medication twice a day with his pills to see if that helped. - We will continue Gleevec as long as his platelet count stays above 50. -She is continuing to take B12 tablets daily.  His hemoglobin is 11.8 on 04/30/2018. -We recheck labs on 05/27/2018 his platelet count is stable at 122.  His hemoglobin is 12.1. -He reports he has been taking a half a pill in the morning and a whole pill at night.  He will start taking a whole pill  in the morning and a whole pill at night starting on 05/01/2018. -He reports he has been taking 1 whole pill in the morning and 1 whole pill at night which he started on 05/01/2018.  Platelets are stable. -We will repeat CT scans in 2 to 3 months after he started taking the [place] correctly. -Labs on 07/22/2018 showed his platelet count at 35. -Labs on 08/06/2018 showed his platelet count increased to 47. -We will see him back in 3 weeks if platelet count has increased to over 50  we will begin the [place] back at a reduced dose. -Patient had labs on 09/04/2018 which showed his platelet count had increased to 113. -We will start him back on Gleevec 300 mg daily.  200 mg in the morning and 100 mg at night.  A new prescription was sent into the pharmacy. - We will see him back in 3 weeks with labs and CT scans.  Follow Up Instructions: -Patient will start on the reduced dose of Gleevec. -He will follow-up with CT scans and labs.   I discussed the assessment and treatment plan with the patient. The patient was provided an opportunity to ask questions and all were answered. The patient agreed with the plan and demonstrated an understanding of the instructions.   The patient was advised to call back or seek an in-person evaluation if the symptoms worsen or if the condition fails to improve as anticipated.  I provided 20 minutes of non-face-to-face time during this encounter. -Video chat was unavailable at this time.   [place]ie [place], NP-C

## 2018-09-11 ENCOUNTER — Other Ambulatory Visit (HOSPITAL_COMMUNITY): Payer: Self-pay | Admitting: *Deleted

## 2018-09-11 DIAGNOSIS — C49A3 Gastrointestinal stromal tumor of small intestine: Secondary | ICD-10-CM

## 2018-09-11 MED ORDER — IMATINIB MESYLATE 100 MG PO TABS: 100.0000 mg | ORAL_TABLET | Freq: Two times a day (BID) | ORAL | 3 refills | Status: DC

## 2018-09-11 MED ORDER — IMATINIB MESYLATE 100 MG PO TABS: ORAL_TABLET | ORAL | 3 refills | Status: DC

## 2018-09-29 ENCOUNTER — Inpatient Hospital Stay (HOSPITAL_COMMUNITY): Payer: Medicaid Other | Attending: Hematology

## 2018-09-30 ENCOUNTER — Ambulatory Visit (HOSPITAL_COMMUNITY): Admission: RE | Admit: 2018-09-30 | Payer: Medicaid Other | Source: Ambulatory Visit

## 2018-09-30 ENCOUNTER — Other Ambulatory Visit (HOSPITAL_COMMUNITY): Payer: Medicaid Other

## 2018-10-03 ENCOUNTER — Ambulatory Visit (HOSPITAL_COMMUNITY): Payer: Medicaid Other | Admitting: Nurse Practitioner

## 2018-10-09 ENCOUNTER — Other Ambulatory Visit (HOSPITAL_COMMUNITY): Payer: Medicaid Other

## 2018-10-23 ENCOUNTER — Ambulatory Visit (HOSPITAL_COMMUNITY)
Admission: RE | Admit: 2018-10-23 | Discharge: 2018-10-23 | Disposition: A | Discharge status: Home / Self Care | Payer: Medicaid Other | Source: Ambulatory Visit | Attending: Nurse Practitioner | Admitting: Nurse Practitioner

## 2018-10-23 ENCOUNTER — Other Ambulatory Visit: Payer: Self-pay

## 2018-10-23 ENCOUNTER — Ambulatory Visit (HOSPITAL_COMMUNITY): Payer: Medicaid Other | Admitting: Nurse Practitioner

## 2018-10-23 DIAGNOSIS — C49A3 Gastrointestinal stromal tumor of small intestine: Secondary | ICD-10-CM | POA: Insufficient documentation

## 2018-10-23 MED ORDER — IOHEXOL 300 MG/ML  SOLN
100.0000 mL | Freq: Once | INTRAMUSCULAR | Status: AC | PRN
  Administered 2018-10-23: 10:00:00 100 mL via INTRAVENOUS

## 2018-10-28 ENCOUNTER — Other Ambulatory Visit: Payer: Self-pay

## 2018-10-28 ENCOUNTER — Inpatient Hospital Stay (HOSPITAL_COMMUNITY): Payer: Medicaid Other | Attending: Internal Medicine | Admitting: Nurse Practitioner

## 2018-10-28 ENCOUNTER — Inpatient Hospital Stay (HOSPITAL_COMMUNITY): Payer: Medicaid Other

## 2018-10-28 DIAGNOSIS — F319 Bipolar disorder, unspecified: Secondary | ICD-10-CM | POA: Insufficient documentation

## 2018-10-28 DIAGNOSIS — Z79899 Other long term (current) drug therapy: Secondary | ICD-10-CM | POA: Diagnosis not present

## 2018-10-28 DIAGNOSIS — C49A3 Gastrointestinal stromal tumor of small intestine: Secondary | ICD-10-CM | POA: Diagnosis present

## 2018-10-28 DIAGNOSIS — F17210 Nicotine dependence, cigarettes, uncomplicated: Secondary | ICD-10-CM | POA: Diagnosis not present

## 2018-10-28 DIAGNOSIS — C7989 Secondary malignant neoplasm of other specified sites: Secondary | ICD-10-CM | POA: Insufficient documentation

## 2018-10-28 DIAGNOSIS — R109 Unspecified abdominal pain: Secondary | ICD-10-CM | POA: Insufficient documentation

## 2018-10-28 DIAGNOSIS — F419 Anxiety disorder, unspecified: Secondary | ICD-10-CM | POA: Diagnosis not present

## 2018-10-28 DIAGNOSIS — R5383 Other fatigue: Secondary | ICD-10-CM | POA: Insufficient documentation

## 2018-10-28 DIAGNOSIS — R197 Diarrhea, unspecified: Secondary | ICD-10-CM | POA: Diagnosis not present

## 2018-10-28 LAB — CBC WITH DIFFERENTIAL/PLATELET
Abs Immature Granulocytes: 0.02 10*3/uL (ref 0.00–0.07)
Basophils Absolute: 0.1 10*3/uL (ref 0.0–0.1)
Basophils Relative: 1 %
Eosinophils Absolute: 0.1 10*3/uL (ref 0.0–0.5)
Eosinophils Relative: 1 %
HCT: 37.3 % — ABNORMAL LOW (ref 39.0–52.0)
Hemoglobin: 12.7 g/dL — ABNORMAL LOW (ref 13.0–17.0)
Immature Granulocytes: 0 %
Lymphocytes Relative: 16 %
Lymphs Abs: 1.2 10*3/uL (ref 0.7–4.0)
MCH: 33.5 pg (ref 26.0–34.0)
MCHC: 34 g/dL (ref 30.0–36.0)
MCV: 98.4 fL (ref 80.0–100.0)
Monocytes Absolute: 0.6 10*3/uL (ref 0.1–1.0)
Monocytes Relative: 7 %
Neutro Abs: 5.8 10*3/uL (ref 1.7–7.7)
Neutrophils Relative %: 75 %
Platelets: 67 10*3/uL — ABNORMAL LOW (ref 150–400)
RBC: 3.79 MIL/uL — ABNORMAL LOW (ref 4.22–5.81)
RDW: 14.4 % (ref 11.5–15.5)
WBC: 7.7 10*3/uL (ref 4.0–10.5)
nRBC: 0 % (ref 0.0–0.2)

## 2018-10-28 LAB — COMPREHENSIVE METABOLIC PANEL
ALT: 44 U/L (ref 0–44)
AST: 144 U/L — ABNORMAL HIGH (ref 15–41)
Albumin: 2.4 g/dL — ABNORMAL LOW (ref 3.5–5.0)
Alkaline Phosphatase: 234 U/L — ABNORMAL HIGH (ref 38–126)
Anion gap: 10 (ref 5–15)
BUN: <5 mg/dL — ABNORMAL LOW (ref 6–20)
CO2: 24 mmol/L (ref 22–32)
Calcium: 7.6 mg/dL — ABNORMAL LOW (ref 8.9–10.3)
Chloride: 100 mmol/L (ref 98–111)
Creatinine, Ser: 0.56 mg/dL — ABNORMAL LOW (ref 0.61–1.24)
GFR calc Af Amer: >60 mL/min (ref >60)
GFR calc non Af Amer: >60 mL/min (ref >60)
Glucose, Bld: 95 mg/dL (ref 70–99)
Potassium: 3.6 mmol/L (ref 3.5–5.1)
Sodium: 134 mmol/L — ABNORMAL LOW (ref 135–145)
Total Bilirubin: 1 mg/dL (ref 0.3–1.2)
Total Protein: 6.6 g/dL (ref 6.5–8.1)

## 2018-10-28 LAB — LACTATE DEHYDROGENASE: LDH: 237 U/L — ABNORMAL HIGH (ref 98–192)

## 2018-10-28 MED ORDER — HEPARIN SOD (PORK) LOCK FLUSH 100 UNIT/ML IV SOLN
500.0000 [IU] | Freq: Once | INTRAVENOUS | Status: AC
  Administered 2018-10-28: 11:00:00 500 [IU] via INTRAVENOUS

## 2018-10-28 MED ORDER — SODIUM CHLORIDE 0.9% FLUSH
10.0000 mL | INTRAVENOUS | Status: DC | PRN
  Administered 2018-10-28: 10 mL via INTRAVENOUS
  Filled 2018-10-28: qty 10

## 2018-10-28 MED ORDER — HEPARIN SOD (PORK) LOCK FLUSH 100 UNIT/ML IV SOLN: 500.0000 [IU] | Freq: Once | INTRAVENOUS | Status: DC

## 2018-10-28 MED ORDER — SODIUM CHLORIDE 0.9% FLUSH: 10.0000 mL | Freq: Once | INTRAVENOUS | Status: DC

## 2018-10-28 NOTE — Assessment & Plan Note (Addendum)
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 - Was started on [place] in March 2015. - He tolerated the Gleevec 400 mg daily very well with occasional nausea.  He denied any abdominal pains bloating or bowel habit changes.  Also denied any bleeding per rectum or melena. - He had a CT abdomen and pelvis on 01/29/2018 that showed 2 cavitary soft tissue nodules in the left abdomen concerning for progression of disease.  The larger of the 2 lesions is contiguous with the descending colon and is a gas filled, with possible communication with the colonic lumen.  This measures 3.3 cm is contiguous with adjacent small bowel and is at the location of 15 m nodule on prior scan from October 2018.  The smaller of the 2 lesions is more proximal along the descending colon measuring 1.2 x 1.7 cm. - He was on imatinib and it was increased to 800 mg daily.  He tolerated the lower dose well, however with the increased dose his platelet count dropped to 37, and the imatinib was held at this time. - He was started back on Gleevec 1 tablet daily on 03/05/2018 when his platelet count improved to 120.  He tolerated the Gleevec well at this dose. -His Gleevec was increased to 1 tablet twice daily on 03/26/2018. - Labs were drawn on 04/10/2018 and his platelet count was 83.  He did report he was only taken 1&1/2 pills daily.  He was re-educated about the importance of taking them 12 hours apart with food and will start taking 1 pill twice daily. -The pills are causing him some nausea he was told to take his nausea medication twice a day with his pills to see if that helped. - We will continue Gleevec as long as his platelet count stays above 50. -She is continuing to take B12 tablets daily.  His hemoglobin is 11.8 on 04/30/2018. -We recheck labs on 05/27/2018 his platelet count is stable at 122.  His hemoglobin is  12.1. -He reports he has been taking a half a pill in the morning and a whole pill at night.  He will start taking a whole pill in the morning and a whole pill at night starting on 05/01/2018. -He reports he has been taking 1 whole pill in the morning and 1 whole pill at night which he started on 05/01/2018.  Platelets are stable. -We will repeat CT scans in 2 to 3 months after he started taking the [place] correctly. -Labs on 07/22/2018 showed his platelet count at 35. -Labs on 08/06/2018 showed his platelet count increased to 47. -We will see him back in 3 weeks if platelet count has increased to over 50 we will begin the [place] back at a reduced dose. -Patient has canceled several appointments.  He opted for a telephone visit with lab draw the day ahead of time. -Labs were drawn on 09/04/2018 and his platelet count was 113. -At this time we started Gleevec 200 mg in the a.m. and 100 in the p.m. he is tolerating this dose well. -CT CAP done on 10/23/2018 showed dominant para colonic cavitary lesion seen along the distal descending colon previously it is now a confluent/solid soft tissue nodule measuring 2.6 x 2.1 cm.  Metastatic disease is a distinct consideration.  The more proximal smaller lesion seen previously along the descending colon measured 1.3 cm today.  No new findings suggest  new sites of metastatic involvement. -Labs done today showed his platelet count was 67 -We will continue Gleevec at the same reduced dose at this time. -Due to the patient being unable to tolerate the [place] at a higher dose because of his platelet decrease and also being noncompliant with medication regimen and follow-up appointments we will keep him at the lower dose of 400 mg daily. - We will see him back in 4 weeks with labs.  2.polysubstance abuse: - It is documented in his chart he has a history of EtOH and cocaine abuse with urine drug screen positive for cocaine and negative for opioids on multiple  occasions. -He is on buprenorphine buccal film. - He has promised to be more compliant with medications and his appointments.

## 2018-10-28 NOTE — Progress Notes (Signed)
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   02/10/2015 Imaging   CT CAP-  Mildly reduced size of left abdominal tumor implants. No new tumor implants identified.   03/14/2015 Procedure   Port-a-cath placed by Dr. Arnoldo Morale   03/14/2016 Imaging   CT CAP- 1. Continued reduction in size of left abdominal peritoneal metastases. No new or progressive metastatic disease in the abdomen or pelvis. 2. Solitary new irregular 1.0 cm nodular opacity in the anterior right upper lobe, highly nonspecific, potentially inflammatory, however cannot exclude pulmonary malignancy (metastatic or primary lung carcinoma). New nonspecific mild subcarinal and right hilar lymphadenopathy, cannot exclude new nodal metastases. PET-CT could be useful for further characterization / biopsy planning, as clinically warranted. Alternatively, a follow-up chest CT with IV contrast could be obtained in 3 months.   07/03/2016 Imaging   Ct chest- No findings specific for metastatic disease in the chest.  Small thoracic lymph nodes measuring up to 9 mm short axis, decreased.  No suspicious pulmonary nodules.      INTERVAL HISTORY:  [name] 58 y.o. male returns for routine follow-up for GIST.  Patient reports he is always had mild abdominal pain and diarrhea.  This is unchanged from his normal.  He also has fatigue daily which is also normal for him. Denies any new pains. Had not noticed any recent bleeding such as epistaxis,  hematuria or hematochezia. Denies recent chest pain on exertion, shortness of breath on minimal exertion, pre-syncopal episodes, or palpitations. Denies any numbness or tingling in hands or feet. Denies any recent fevers, infections, or recent hospitalizations. Patient reports appetite at 50% and energy level at 50%.     REVIEW OF SYSTEMS:  Review of Systems  Constitutional: Positive for fatigue.  Gastrointestinal: Positive for abdominal pain, diarrhea and nausea.  All other systems reviewed and are negative.    PAST MEDICAL/SURGICAL HISTORY:  Past Medical History:  Diagnosis Date   Alcohol abuse    Anemia    Anxiety 09/04/2012   GERD (gastroesophageal reflux disease)    GIST (gastrointestinal stroma tumor), malignant, colon (HCC)    Hx of bipolar disorder    Malignant GIST ([place]) 09/23/2014   initially diagnosed in 2014   Pancreatitis    PE (physical exam), annual    Pulmonary nodule 05/29/2016   Tobacco abuse    Ventral hernia    Past Surgical History:  Procedure Laterality Date   BIOPSY  04/29/2017   Procedure: BIOPSY;  Surgeon: Daneil Dolin, MD;  [facility];  Service: Endoscopy;;  gastric and esophageal biopsy   COLONOSCOPY WITH PROPOFOL [name] 04/29/2017   Procedure: COLONOSCOPY WITH PROPOFOL;  Surgeon: Daneil Dolin, MD;  [facility]: [facility];  Service: Endoscopy;  Laterality: [name];  11:00am   ESOPHAGOGASTRODUODENOSCOPY (EGD) WITH PROPOFOL [name] 04/29/2017   Procedure: ESOPHAGOGASTRODUODENOSCOPY (EGD) WITH PROPOFOL;  Surgeon: Daneil Dolin, MD;  [facility]: [facility];  Service: Endoscopy;  Laterality: [name];   HERNIA REPAIR     MALONEY DILATION [name] 04/29/2017   Procedure: Venia Minks  DILATION;  Surgeon: Daneil Dolin, MD;  [facility];  Service: Endoscopy;  Laterality: [name];   PARTIAL COLECTOMY     POLYPECTOMY  04/29/2017   Procedure: POLYPECTOMY;  Surgeon: Daneil Dolin, MD;  [facility];  Service: Endoscopy;;   hepatic flexure polyp   PORTACATH PLACEMENT Right 03/14/2015   Procedure: INSERTION PORT-A-CATH;  Surgeon: Aviva Signs, MD;  Location: [facility];  Service: General;  Laterality: Right;   SMALL INTESTINE SURGERY     small bowel resection for obstruction     SOCIAL HISTORY:  Social History   Socioeconomic History   Marital status: Single    Spouse name: Not on file   Number of children: Not on file   Years of education: Not on file   Highest education level: Not on file  Occupational History   Not on file  Social Needs   Financial resource strain: Not on file   Food insecurity    Worry: Not on file    Inability: Not on file   Transportation needs    Medical: Not on file    Non-medical: Not on file  Tobacco Use   Smoking status: Current Every Day Smoker    Packs/day: 0.50    Years: 36.00    Pack years: 18.00    Types: Cigarettes   Smokeless tobacco: Never Used   Tobacco comment: refused smoking cessation materials.   Substance and Sexual Activity   Alcohol use: Yes    Alcohol/week: 24.0 standard drinks    Types: 24 Cans of beer per week    Comment: 2 - 3 cans beer daily   Drug use: Yes    Types: Cocaine    Comment: denied 03/29/17, none in 6-7 months. "occasional"    Sexual activity: Not Currently  Lifestyle   Physical activity    Days per week: Not on file    Minutes per session: Not on file   Stress: Not on file  Relationships   Social connections    Talks on phone: Not on file    Gets together: Not on file    Attends religious service: Not on file    Active member of club or organization: Not on file    Attends meetings of clubs or organizations: Not on file    Relationship status: Not on file   Intimate partner violence    Fear of current or ex partner: Not on file    Emotionally abused: Not on file    Physically abused: Not on file    Forced sexual activity: Not on file  Other Topics Concern   Not on file  Social History Narrative    Not on file    FAMILY HISTORY:  Family History  Problem Relation Age of Onset   Cancer Mother    Hypertension Father    Cancer Brother    Colon cancer Neg Hx     CURRENT MEDICATIONS:  Outpatient Encounter Medications as of 10/28/2018  Medication Sig   BELBUCA 600 MCG FILM place ONE film UNDER THE TONGUE EVERY TWELVE HOURS FOR chronic pain   busPIRone (BUSPAR) 30 MG tablet Take 30 mg by mouth 2 (two) times daily.   Calcium Carbonate (CALCIUM 600 PO) Take by mouth daily.   Cholecalciferol (VITAMIN D3) 50 MCG (2000 UT) capsule Take 2,000 Units by mouth daily.   cyanocobalamin 1000 MCG tablet Take 1 tablet (1,000 mcg total) by mouth daily.   DULoxetine (CYMBALTA) 30 MG capsule Take 30  mg by mouth daily.   esomeprazole (NEXIUM) 20 MG capsule Take 20 mg by mouth 2 (two) times daily.   furosemide (LASIX) 20 MG tablet Take 0.5 tablets (10 mg total) by mouth daily as needed.   hydrochlorothiazide (HYDRODIURIL) 12.5 MG tablet Take 12.5 mg by mouth daily.   imatinib (GLEEVEC) 100 MG tablet Take 2 tablets in the morning and 1 tablet in the evening. Take with meals and large glass of water   Magnesium 250 MG TABS Take 250 mg by mouth 2 (two) times daily.   methocarbamol (ROBAXIN) 500 MG tablet TAKE 1 TO 2 TABLETS BY MOUTH EVERY 8 HOURS AS NEEDED FOR SEVERE pain (MAY cause drowsiness)   mirtazapine (REMERON) 45 MG tablet Take by mouth.   neomycin-polymyxin-hydrocortisone (CORTISPORIN) 3.5-10000-1 OTIC suspension PLACE FOUR DROPS IN THE AFFECTED EAR(S) THREE TIMES DAILY   oxyCODONE-acetaminophen (PERCOCET) 10-325 MG tablet TAKE 1/2 TABLET BY MOUTH EVERY 4 HOURS AS NEEDED FOR SEVERE PAIN. MAX TWO TABLETS PER DAY   potassium chloride SA (K-DUR,KLOR-CON) 20 MEQ tablet Take 3 tablets (60 mEq total) by mouth daily.   pregabalin (LYRICA) 150 MG capsule Take 150 mg by mouth 3 (three) times daily.   pregabalin (LYRICA) 200 MG capsule Take 200 mg by mouth 3 (three) times daily.    traZODone (DESYREL) 50 MG tablet Take 50 mg by mouth at bedtime.   lidocaine-prilocaine (EMLA) cream Apply 1 application topically as needed. (Patient not taking: Reported on 10/28/2018)   No facility-administered encounter medications on file as of 10/28/2018.     ALLERGIES:  No Known Allergies   PHYSICAL EXAM:  ECOG Performance status: 1  Vitals:   10/28/18 1024  BP: 117/76  Pulse: 89  Resp: 18  Temp: (!) 97.5 F (36.4 C)  SpO2: 99%   Filed Weights   10/28/18 1024  Weight: 173 lb (78.5 kg)    Physical Exam Constitutional:      Appearance: Normal appearance. He is normal weight.  Cardiovascular:     Rate and Rhythm: Normal rate and regular rhythm.     Heart sounds: Normal heart sounds.  Pulmonary:     Effort: Pulmonary effort is normal.     Breath sounds: Normal breath sounds.  Abdominal:     General: Bowel sounds are normal.     Palpations: Abdomen is soft.  Musculoskeletal: Normal range of motion.  Skin:    General: Skin is warm and dry.  Neurological:     Mental Status: He is alert and oriented to person, place, and time. Mental status is at baseline.  Psychiatric:        Mood and Affect: Mood normal.        Behavior: Behavior normal.        Thought Content: Thought content normal.        Judgment: Judgment normal.      LABORATORY DATA:  I have reviewed the labs as listed.  CBC    Component Value Date/Time   WBC 7.7 10/28/2018 1112   RBC 3.79 (L) 10/28/2018 1112   HGB 12.7 (L) 10/28/2018 1112   HCT 37.3 (L) 10/28/2018 1112   PLT 67 (L) 10/28/2018 1112   MCV 98.4 10/28/2018 1112   MCH 33.5 10/28/2018 1112   MCHC 34.0 10/28/2018 1112   RDW 14.4 10/28/2018 1112   LYMPHSABS 1.2 10/28/2018 1112   MONOABS 0.6 10/28/2018 1112   EOSABS 0.1 10/28/2018 1112   BASOSABS 0.1 10/28/2018 1112   CMP Latest Ref Rng & Units 10/28/2018  09/04/2018 08/06/2018  Glucose 70 - 99 mg/dL 95 122(H) 119(H)  BUN 6 - 20 mg/dL <5(L) <5(L) <5(L)  Creatinine 0.61 - 1.24 mg/dL  0.56(L) 0.69 0.71  Sodium 135 - 145 mmol/L 134(L) 134(L) 133(L)  Potassium 3.5 - 5.1 mmol/L 3.6 4.1 3.1(L)  Chloride 98 - 111 mmol/L 100 98 93(L)  CO2 22 - 32 mmol/L 24 26 22   Calcium 8.9 - 10.3 mg/dL 7.6(L) 7.8(L) 7.3(L)  Total Protein 6.5 - 8.1 g/dL 6.6 5.8(L) 5.9(L)  Total Bilirubin 0.3 - 1.2 mg/dL 1.0 0.9 0.9  Alkaline Phos 38 - 126 U/L 234(H) 223(H) 239(H)  AST 15 - 41 U/L 144(H) 161(H) 109(H)  ALT 0 - 44 U/L 44 63(H) 42       DIAGNOSTIC IMAGING:  I have independently reviewed the CT scans and discussed with the patient.   I personally performed a face-to-face visit.  All questions were answered to patient's stated satisfaction. Encouraged patient to call with any new concerns or questions before his next visit to the cancer center and we can certain see him sooner, if needed.     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 - Was started on [place] in March 2015. - He tolerated the Gleevec 400 mg daily very well with occasional nausea.  He denied any abdominal pains bloating or bowel habit changes.  Also denied any bleeding per rectum or melena. - He had a CT abdomen and pelvis on 01/29/2018 that showed 2 cavitary soft tissue nodules in the left abdomen concerning for progression of disease.  The larger of the 2 lesions is contiguous with the descending colon and is a gas filled, with possible communication with the colonic lumen.  This measures 3.3 cm is contiguous with adjacent small bowel and is at the location of 15 m nodule on prior scan from October 2018.  The smaller of the 2 lesions is more proximal along the descending colon measuring 1.2 x 1.7 cm. - He was on imatinib and it was increased to 800 mg daily.  He tolerated the lower dose well, however with the  increased dose his platelet count dropped to 37, and the imatinib was held at this time. - He was started back on Gleevec 1 tablet daily on 03/05/2018 when his platelet count improved to 120.  He tolerated the Gleevec well at this dose. -His Gleevec was increased to 1 tablet twice daily on 03/26/2018. - Labs were drawn on 04/10/2018 and his platelet count was 83.  He did report he was only taken 1&1/2 pills daily.  He was re-educated about the importance of taking them 12 hours apart with food and will start taking 1 pill twice daily. -The pills are causing him some nausea he was told to take his nausea medication twice a day with his pills to see if that helped. - We will continue Gleevec as long as his platelet count stays above 50. -She is continuing to take B12 tablets daily.  His hemoglobin is 11.8 on 04/30/2018. -We recheck labs on 05/27/2018 his platelet count is stable at 122.  His hemoglobin is 12.1. -He reports he has been taking a half a pill in the morning and a whole pill at night.  He will start taking a whole pill in the morning and a whole pill  at night starting on 05/01/2018. -He reports he has been taking 1 whole pill in the morning and 1 whole pill at night which he started on 05/01/2018.  Platelets are stable. -We will repeat CT scans in 2 to 3 months after he started taking the [place] correctly. -Labs on 07/22/2018 showed his platelet count at 35. -Labs on 08/06/2018 showed his platelet count increased to 47. -We will see him back in 3 weeks if platelet count has increased to over 50 we will begin the [place] back at a reduced dose. -Patient has canceled several appointments.  He opted for a telephone visit with lab draw the day ahead of time. -Labs were drawn on 09/04/2018 and his platelet count was 113. -At this time we started Gleevec 200 mg in the a.m. and 100 in the p.m. he is tolerating this dose well. -CT CAP done on 10/23/2018 showed dominant para colonic cavitary lesion seen along  the distal descending colon previously it is now a confluent/solid soft tissue nodule measuring 2.6 x 2.1 cm.  Metastatic disease is a distinct consideration.  The more proximal smaller lesion seen previously along the descending colon measured 1.3 cm today.  No new findings suggest new sites of metastatic involvement. -Labs done today showed his platelet count was 67 -We will continue Gleevec at the same reduced dose at this time. -Due to the patient being unable to tolerate the [place] at a higher dose because of his platelet decrease and also being noncompliant with medication regimen and follow-up appointments we will keep him at the lower dose of 400 mg daily. - We will see him back in 4 weeks with labs.  2.polysubstance abuse: - It is documented in his chart he has a history of EtOH and cocaine abuse with urine drug screen positive for cocaine and negative for opioids on multiple occasions. -He is on buprenorphine buccal film. - He has promised to be more compliant with medications and his appointments.      Orders placed this encounter:  Orders Placed This Encounter  Procedures   CBC with Differential/Platelet   Comprehensive metabolic panel   Lactate dehydrogenase      Kenneth Finders, FNP-C [place] [phone]

## 2018-10-28 NOTE — Patient Instructions (Signed)
[facility] at [facility] Discharge Instructions     Thank you for choosing  Cancer Center at [facility] to provide your oncology and hematology care.  To afford each patient quality time with our provider, please arrive at least 15 minutes before your scheduled appointment time.   If you have a lab appointment with the Cancer Center please come in thru the Main Entrance and check in at the main information desk.  You need to re-schedule your appointment should you arrive 10 or more minutes late.  We strive to give you quality time with our providers, and arriving late affects you and other patients whose appointments are after yours.  Also, if you no show three or more times for appointments you may be dismissed from the clinic at the providers discretion.     Again, thank you for choosing [facility].  Our hope is that these requests will decrease the amount of time that you wait before being seen by our physicians.       _____________________________________________________________  Should you have questions after your visit to [facility], please contact our office at [phone] between the hours of 8:00 a.m. and 4:30 p.m.  Voicemails left after 4:00 p.m. will not be returned until the following business day.  For prescription refill requests, have your pharmacy contact our office and allow 72 hours.    Due to Covid, you will need to wear a mask upon entering the hospital. If you do not have a mask, a mask will be given to you at the Main Entrance upon arrival. For doctor visits, patients may have 1 support person with them. For treatment visits, patients can not have anyone with them due to social distancing guidelines and our immunocompromised population.      

## 2018-10-28 NOTE — Progress Notes (Signed)
Patients port flushed without difficulty.  Good blood return noted with no bruising or swelling noted at site.  Band aid applied.  VSS with discharge and left ambulatory with no s/s of distress noted.  

## 2018-11-06 ENCOUNTER — Encounter (HOSPITAL_COMMUNITY): Payer: Medicaid Other

## 2018-11-21 ENCOUNTER — Other Ambulatory Visit (HOSPITAL_COMMUNITY): Payer: Medicaid Other

## 2018-11-25 ENCOUNTER — Inpatient Hospital Stay (HOSPITAL_COMMUNITY): Payer: Medicaid Other

## 2018-11-25 ENCOUNTER — Ambulatory Visit (HOSPITAL_COMMUNITY): Payer: Medicaid Other | Admitting: Nurse Practitioner

## 2019-01-05 ENCOUNTER — Inpatient Hospital Stay (HOSPITAL_COMMUNITY): Payer: Medicaid Other | Attending: Hematology

## 2019-01-05 ENCOUNTER — Inpatient Hospital Stay (HOSPITAL_COMMUNITY): Payer: Medicaid Other | Admitting: Nurse Practitioner

## 2019-01-05 ENCOUNTER — Other Ambulatory Visit: Payer: Self-pay

## 2019-01-05 DIAGNOSIS — C49A3 Gastrointestinal stromal tumor of small intestine: Secondary | ICD-10-CM | POA: Insufficient documentation

## 2019-01-05 DIAGNOSIS — Z79899 Other long term (current) drug therapy: Secondary | ICD-10-CM | POA: Diagnosis not present

## 2019-01-05 DIAGNOSIS — C786 Secondary malignant neoplasm of retroperitoneum and peritoneum: Secondary | ICD-10-CM | POA: Diagnosis not present

## 2019-01-05 LAB — CBC WITH DIFFERENTIAL/PLATELET
Abs Immature Granulocytes: 0.03 10*3/uL (ref 0.00–0.07)
Basophils Absolute: 0.1 10*3/uL (ref 0.0–0.1)
Basophils Relative: 1 %
Eosinophils Absolute: 0.1 10*3/uL (ref 0.0–0.5)
Eosinophils Relative: 1 %
HCT: 31.8 % — ABNORMAL LOW (ref 39.0–52.0)
Hemoglobin: 10.9 g/dL — ABNORMAL LOW (ref 13.0–17.0)
Immature Granulocytes: 0 %
Lymphocytes Relative: 27 %
Lymphs Abs: 2 10*3/uL (ref 0.7–4.0)
MCH: 36.2 pg — ABNORMAL HIGH (ref 26.0–34.0)
MCHC: 34.3 g/dL (ref 30.0–36.0)
MCV: 105.6 fL — ABNORMAL HIGH (ref 80.0–100.0)
Monocytes Absolute: 0.6 10*3/uL (ref 0.1–1.0)
Monocytes Relative: 9 %
Neutro Abs: 4.6 10*3/uL (ref 1.7–7.7)
Neutrophils Relative %: 62 %
Platelets: 51 10*3/uL — ABNORMAL LOW (ref 150–400)
RBC: 3.01 MIL/uL — ABNORMAL LOW (ref 4.22–5.81)
RDW: 14.1 % (ref 11.5–15.5)
WBC: 7.4 10*3/uL (ref 4.0–10.5)
nRBC: 0 % (ref 0.0–0.2)

## 2019-01-05 LAB — COMPREHENSIVE METABOLIC PANEL
ALT: 22 U/L (ref 0–44)
AST: 86 U/L — ABNORMAL HIGH (ref 15–41)
Albumin: 2.6 g/dL — ABNORMAL LOW (ref 3.5–5.0)
Alkaline Phosphatase: 139 U/L — ABNORMAL HIGH (ref 38–126)
Anion gap: 14 (ref 5–15)
BUN: <5 mg/dL — ABNORMAL LOW (ref 6–20)
CO2: 20 mmol/L — ABNORMAL LOW (ref 22–32)
Calcium: 6.8 mg/dL — ABNORMAL LOW (ref 8.9–10.3)
Chloride: 96 mmol/L — ABNORMAL LOW (ref 98–111)
Creatinine, Ser: 0.67 mg/dL (ref 0.61–1.24)
GFR calc Af Amer: >60 mL/min (ref >60)
GFR calc non Af Amer: >60 mL/min (ref >60)
Glucose, Bld: 109 mg/dL — ABNORMAL HIGH (ref 70–99)
Potassium: 3.7 mmol/L (ref 3.5–5.1)
Sodium: 130 mmol/L — ABNORMAL LOW (ref 135–145)
Total Bilirubin: 1.1 mg/dL (ref 0.3–1.2)
Total Protein: 6.4 g/dL — ABNORMAL LOW (ref 6.5–8.1)

## 2019-01-05 LAB — LACTATE DEHYDROGENASE: LDH: 330 U/L — ABNORMAL HIGH (ref 98–192)

## 2019-01-05 NOTE — Assessment & Plan Note (Signed)
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 - Was started on [place] in March 2015. - He tolerated the Gleevec 400 mg daily very well with occasional nausea.  He denied any abdominal pains bloating or bowel habit changes.  Also denied any bleeding per rectum or melena. - He had a CT abdomen and pelvis on 01/29/2018 that showed 2 cavitary soft tissue nodules in the left abdomen concerning for progression of disease.  The larger of the 2 lesions is contiguous with the descending colon and is a gas filled, with possible communication with the colonic lumen.  This measures 3.3 cm is contiguous with adjacent small bowel and is at the location of 15 m nodule on prior scan from October 2018.  The smaller of the 2 lesions is more proximal along the descending colon measuring 1.2 x 1.7 cm. - He was on imatinib and it was increased to 800 mg daily.  He tolerated the lower dose well, however with the increased dose his platelet count dropped to 37, and the imatinib was held at this time. - He was started back on Gleevec 1 tablet daily on 03/05/2018 when his platelet count improved to 120.  He tolerated the Gleevec well at this dose. -His Gleevec was increased to 1 tablet twice daily on 03/26/2018. - Labs were drawn on 04/10/2018 and his platelet count was 83.  He did report he was only taken 1&1/2 pills daily.  He was re-educated about the importance of taking them 12 hours apart with food and will start taking 1 pill twice daily. -The pills are causing him some nausea he was told to take his nausea medication twice a day with his pills to see if that helped. - We will continue Gleevec as long as his platelet count stays above 50. -She is continuing to take B12 tablets daily.  His hemoglobin is 11.8 on 04/30/2018. -We recheck labs on 05/27/2018 his platelet count is stable at 122.  His hemoglobin is  12.1. -He reports he has been taking a half a pill in the morning and a whole pill at night.  He will start taking a whole pill in the morning and a whole pill at night starting on 05/01/2018. -He reports he has been taking 1 whole pill in the morning and 1 whole pill at night which he started on 05/01/2018.  Platelets are stable. -We will repeat CT scans in 2 to 3 months after he started taking the [place] correctly. -Labs on 07/22/2018 showed his platelet count at 35. -Labs on 08/06/2018 showed his platelet count increased to 47. -We will see him back in 3 weeks if platelet count has increased to over 50 we will begin the [place] back at a reduced dose. -Patient has canceled several appointments.  He opted for a telephone visit with lab draw the day ahead of time. -Labs were drawn on 09/04/2018 and his platelet count was 113. -At this time we started Gleevec 200 mg in the a.m. and 100 in the p.m. he is tolerating this dose well. -CT CAP done on 10/23/2018 showed dominant para colonic cavitary lesion seen along the distal descending colon previously it is now a confluent/solid soft tissue nodule measuring 2.6 x 2.1 cm.  Metastatic disease is a distinct consideration.  The more proximal smaller lesion seen previously along the descending colon measured 1.3 cm today.  No new findings suggest  new sites of metastatic involvement. -Labs done today showed his platelet count was 67 -We will continue Gleevec at the same reduced dose at this time. -Due to the patient being unable to tolerate the [place] at a higher dose because of his platelet decrease and also being noncompliant with medication regimen and follow-up appointments we will keep him at the lower dose of 400 mg daily. - We will see him back in 4 weeks with labs.  2.polysubstance abuse: - It is documented in his chart he has a history of EtOH and cocaine abuse with urine drug screen positive for cocaine and negative for opioids on multiple  occasions. -He is on buprenorphine buccal film. - He has promised to be more compliant with medications and his appointments.

## 2019-01-05 NOTE — Progress Notes (Unsigned)
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   02/10/2015 Imaging   CT CAP-  Mildly reduced size of left abdominal tumor implants. No new tumor implants identified.   03/14/2015 Procedure   Port-a-cath placed by Dr. Arnoldo Morale   03/14/2016 Imaging   CT CAP- 1. Continued reduction in size of left abdominal peritoneal metastases. No new or progressive metastatic disease in the abdomen or pelvis. 2. Solitary new irregular 1.0 cm nodular opacity in the anterior right upper lobe, highly nonspecific, potentially inflammatory, however cannot exclude pulmonary malignancy (metastatic or primary lung carcinoma). New nonspecific mild subcarinal and right hilar lymphadenopathy, cannot exclude new nodal metastases. PET-CT could be useful for further characterization / biopsy planning, as clinically warranted. Alternatively, a follow-up chest CT with IV contrast could be obtained in 3 months.   07/03/2016 Imaging   Ct chest- No findings specific for metastatic disease in the chest.  Small thoracic lymph nodes measuring up to 9 mm short axis, decreased.  No suspicious pulmonary nodules.      INTERVAL HISTORY:  Mr. Milhouse 58 y.o. male returns for routine follow-up for GIST. Denies any nausea, vomiting, or diarrhea. Denies any new pains. Had not noticed any recent bleeding such as epistaxis, hematuria or hematochezia. Denies recent chest pain on exertion, shortness of breath on minimal exertion, pre-syncopal  episodes, or palpitations. Denies any numbness or tingling in hands or feet. Denies any recent fevers, infections, or recent hospitalizations. Patient reports appetite at ***% and energy level at ***%. He is eating and maintaining his weight at this time.     REVIEW OF SYSTEMS:  Review of Systems - Oncology   PAST MEDICAL/SURGICAL HISTORY:  Past Medical History:  Diagnosis Date  . Alcohol abuse   . Anemia   . Anxiety 09/04/2012  . GERD (gastroesophageal reflux disease)   . GIST (gastrointestinal stroma tumor), malignant, colon ([place])   . Hx of bipolar disorder   . Malignant GIST ([facility]) 09/23/2014   initially diagnosed in 2014  . Pancreatitis   . PE (physical exam), annual   . Pulmonary nodule 05/29/2016  . Tobacco abuse   . Ventral hernia    Past Surgical History:  Procedure Laterality Date  . BIOPSY  04/29/2017   Procedure: BIOPSY;  Surgeon: Daneil Dolin, MD;  [facility];  Service: Endoscopy;;  gastric and esophageal biopsy  . COLONOSCOPY WITH PROPOFOL [name] 04/29/2017   Procedure: COLONOSCOPY WITH PROPOFOL;  Surgeon: Daneil Dolin, MD;  [facility];  Service: Endoscopy;  Laterality: [name];  11:00am  . ESOPHAGOGASTRODUODENOSCOPY (EGD) WITH PROPOFOL [name] 04/29/2017   Procedure: ESOPHAGOGASTRODUODENOSCOPY (EGD) WITH PROPOFOL;  Surgeon: Daneil Dolin, MD;  [facility];  Service: Endoscopy;  Laterality: [name];  . [place] [name] 04/29/2017   Procedure: Venia Minks DILATION;  Surgeon: Daneil Dolin, MD;  [facility];  Service: Endoscopy;  Laterality: [name];  . PARTIAL COLECTOMY    . POLYPECTOMY  04/29/2017   Procedure: POLYPECTOMY;  Surgeon: Manus Rudd  M, MD;  [facility]: [facility];  Service: Endoscopy;;  hepatic flexure polyp  . PORTACATH PLACEMENT Right 03/14/2015   Procedure: INSERTION PORT-A-CATH;  Surgeon: Aviva Signs, MD;  Location: [facility];  Service: General;  Laterality: Right;  . SMALL INTESTINE SURGERY      small bowel resection for obstruction     SOCIAL HISTORY:  Social History   Socioeconomic History  . Marital status: Single    Spouse name: Not on file  . Number of children: Not on file  . Years of education: Not on file  . Highest education level: Not on file  Occupational History  . Not on file  Social Needs  . Financial resource strain: Not on file  . Food insecurity    Worry: Not on file    Inability: Not on file  . Transportation needs    Medical: Not on file    Non-medical: Not on file  Tobacco Use  . Smoking status: Current Every Day Smoker    Packs/day: 0.50    Years: 36.00    Pack years: 18.00    Types: Cigarettes  . Smokeless tobacco: Never Used  . Tobacco comment: refused smoking cessation materials.   Substance and Sexual Activity  . Alcohol use: Yes    Alcohol/week: 24.0 standard drinks    Types: 24 Cans of beer per week    Comment: 2 - 3 cans beer daily  . Drug use: Yes    Types: Cocaine    Comment: denied 03/29/17, none in 6-7 months. "occasional"   . Sexual activity: Not Currently  Lifestyle  . Physical activity    Days per week: Not on file    Minutes per session: Not on file  . Stress: Not on file  Relationships  . Social Herbalist on phone: Not on file    Gets together: Not on file    Attends religious service: Not on file    Active member of club or organization: Not on file    Attends meetings of clubs or organizations: Not on file    Relationship status: Not on file  . Intimate partner violence    Fear of current or ex partner: Not on file    Emotionally abused: Not on file    Physically abused: Not on file    Forced sexual activity: Not on file  Other Topics Concern  . Not on file  Social History Narrative  . Not on file    FAMILY HISTORY:  Family History  Problem Relation Age of Onset  . Cancer Mother   . Hypertension Father   . Cancer Brother   . Colon cancer Neg Hx     CURRENT MEDICATIONS:  Outpatient  Encounter Medications as of 01/05/2019  Medication Sig  . BELBUCA 600 MCG FILM place ONE film UNDER THE TONGUE EVERY TWELVE HOURS FOR chronic pain  . busPIRone (BUSPAR) 30 MG tablet Take 30 mg by mouth 2 (two) times daily.  . Calcium Carbonate (CALCIUM 600 PO) Take by mouth daily.  . Cholecalciferol (VITAMIN D3) 50 MCG (2000 UT) capsule Take 2,000 Units by mouth daily.  . cyanocobalamin 1000 MCG tablet Take 1 tablet (1,000 mcg total) by mouth daily.  . DULoxetine (CYMBALTA) 30 MG capsule Take 30 mg by mouth daily.  Marland Kitchen esomeprazole (NEXIUM) 20 MG capsule Take 20 mg by mouth 2 (two) times daily.  . furosemide (LASIX) 20 MG tablet Take 0.5 tablets (10 mg total) by mouth daily as  needed.  . hydrochlorothiazide (HYDRODIURIL) 12.5 MG tablet Take 12.5 mg by mouth daily.  Marland Kitchen imatinib (GLEEVEC) 100 MG tablet Take 2 tablets in the morning and 1 tablet in the evening. Take with meals and large glass of water  . lidocaine-prilocaine (EMLA) cream Apply 1 application topically as needed. (Patient not taking: Reported on 10/28/2018)  . Magnesium 250 MG TABS Take 250 mg by mouth 2 (two) times daily.  . methocarbamol (ROBAXIN) 500 MG tablet TAKE 1 TO 2 TABLETS BY MOUTH EVERY 8 HOURS AS NEEDED FOR SEVERE pain (MAY cause drowsiness)  . mirtazapine (REMERON) 45 MG tablet Take by mouth.  . neomycin-polymyxin-hydrocortisone (CORTISPORIN) 3.5-10000-1 OTIC suspension PLACE FOUR DROPS IN THE AFFECTED EAR(S) THREE TIMES DAILY  . oxyCODONE-acetaminophen (PERCOCET) 10-325 MG tablet TAKE 1/2 TABLET BY MOUTH EVERY 4 HOURS AS NEEDED FOR SEVERE PAIN. MAX TWO TABLETS PER DAY  . potassium chloride SA (K-DUR,KLOR-CON) 20 MEQ tablet Take 3 tablets (60 mEq total) by mouth daily.  . pregabalin (LYRICA) 150 MG capsule Take 150 mg by mouth 3 (three) times daily.  . pregabalin (LYRICA) 200 MG capsule Take 200 mg by mouth 3 (three) times daily.  . traZODone (DESYREL) 50 MG tablet Take 50 mg by mouth at bedtime.   No facility-administered  encounter medications on file as of 01/05/2019.     ALLERGIES:  No Known Allergies   PHYSICAL EXAM:  ECOG Performance status: 1  There were no vitals filed for this visit. There were no vitals filed for this visit.  Physical Exam   LABORATORY DATA:  I have reviewed the labs as listed.  CBC    Component Value Date/Time   WBC 7.4 01/05/2019 1253   RBC 3.01 (L) 01/05/2019 1253   HGB 10.9 (L) 01/05/2019 1253   HCT 31.8 (L) 01/05/2019 1253   PLT 51 (L) 01/05/2019 1253   MCV 105.6 (H) 01/05/2019 1253   MCH 36.2 (H) 01/05/2019 1253   MCHC 34.3 01/05/2019 1253   RDW 14.1 01/05/2019 1253   LYMPHSABS 2.0 01/05/2019 1253   MONOABS 0.6 01/05/2019 1253   EOSABS 0.1 01/05/2019 1253   BASOSABS 0.1 01/05/2019 1253   CMP Latest Ref Rng & Units 10/28/2018 09/04/2018 08/06/2018  Glucose 70 - 99 mg/dL 95 122(H) 119(H)  BUN 6 - 20 mg/dL <5(L) <5(L) <5(L)  Creatinine 0.61 - 1.24 mg/dL 0.56(L) 0.69 0.71  Sodium 135 - 145 mmol/L 134(L) 134(L) 133(L)  Potassium 3.5 - 5.1 mmol/L 3.6 4.1 3.1(L)  Chloride 98 - 111 mmol/L 100 98 93(L)  CO2 22 - 32 mmol/L 24 26 22   Calcium 8.9 - 10.3 mg/dL 7.6(L) 7.8(L) 7.3(L)  Total Protein 6.5 - 8.1 g/dL 6.6 5.8(L) 5.9(L)  Total Bilirubin 0.3 - 1.2 mg/dL 1.0 0.9 0.9  Alkaline Phos 38 - 126 U/L 234(H) 223(H) 239(H)  AST 15 - 41 U/L 144(H) 161(H) 109(H)  ALT 0 - 44 U/L 44 63(H) 42       DIAGNOSTIC IMAGING:  I have independently reviewed the scans and discussed with the patient.   I have reviewed Francene Finders, NP's note and agree with the documentation.  I personally performed a face-to-face visit, made revisions and my assessment and plan is as follows.    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 - Was started on [place] in March  2015.  - He tolerated the Gleevec 400 mg daily very well with occasional nausea.  He denied any abdominal pains bloating or bowel habit changes.  Also denied any bleeding per rectum or melena. - He had a CT abdomen and pelvis on 01/29/2018 that showed 2 cavitary soft tissue nodules in the left abdomen concerning for progression of disease.  The larger of the 2 lesions is contiguous with the descending colon and is a gas filled, with possible communication with the colonic lumen.  This measures 3.3 cm is contiguous with adjacent small bowel and is at the location of 15 m nodule on prior scan from October 2018.  The smaller of the 2 lesions is more proximal along the descending colon measuring 1.2 x 1.7 cm. - He was on imatinib and it was increased to 800 mg daily.  He tolerated the lower dose well, however with the increased dose his platelet count dropped to 37, and the imatinib was held at this time. - He was started back on Gleevec 1 tablet daily on 03/05/2018 when his platelet count improved to 120.  He tolerated the Gleevec well at this dose. -His Gleevec was increased to 1 tablet twice daily on 03/26/2018. - Labs were drawn on 04/10/2018 and his platelet count was 83.  He did report he was only taken 1&1/2 pills daily.  He was re-educated about the importance of taking them 12 hours apart with food and will start taking 1 pill twice daily. -The pills are causing him some nausea he was told to take his nausea medication twice a day with his pills to see if that helped. - We will continue Gleevec as long as his platelet count stays above 50. -She is continuing to take B12 tablets daily.  His hemoglobin is 11.8 on 04/30/2018. -We recheck labs on 05/27/2018 his platelet count is stable at 122.  His hemoglobin is 12.1. -He reports he has been taking a half a pill in the morning and a whole pill at night.  He will start taking a whole pill in the morning and a whole pill at night starting on 05/01/2018. -He reports he  has been taking 1 whole pill in the morning and 1 whole pill at night which he started on 05/01/2018.  Platelets are stable. -We will repeat CT scans in 2 to 3 months after he started taking the [place] correctly. -Labs on 07/22/2018 showed his platelet count at 35. -Labs on 08/06/2018 showed his platelet count increased to 47. -We will see him back in 3 weeks if platelet count has increased to over 50 we will begin the [place] back at a reduced dose. -Patient has canceled several appointments.  He opted for a telephone visit with lab draw the day ahead of time. -Labs were drawn on 09/04/2018 and his platelet count was 113. -At this time we started Gleevec 200 mg in the a.m. and 100 in the p.m. he is tolerating this dose well. -CT CAP done on 10/23/2018 showed dominant para colonic cavitary lesion seen along the distal descending colon previously it is now a confluent/solid soft tissue nodule measuring 2.6 x 2.1 cm.  Metastatic disease is a distinct consideration.  The more proximal smaller lesion seen previously along the descending colon measured 1.3 cm today.  No new findings suggest new sites of metastatic involvement. -Labs done today showed his platelet count was 67 -We will continue Gleevec at the same reduced dose at this time. -Due to the patient being unable  to tolerate the Gleevec at a higher dose because of his platelet decrease and also being noncompliant with medication regimen and follow-up appointments we will keep him at the lower dose of 400 mg daily. - We will see him back in 4 weeks with labs.  2.polysubstance abuse: - It is documented in his chart he has a history of EtOH and cocaine abuse with urine drug screen positive for cocaine and negative for opioids on multiple occasions. -He is on buprenorphine buccal film. - He has promised to be more compliant with medications and his appointments.      Orders placed this encounter:  No orders of the defined types were placed in this  encounter.     Derek Jack, MD [place] [phone]

## 2019-01-21 ENCOUNTER — Other Ambulatory Visit: Payer: Self-pay

## 2019-01-21 ENCOUNTER — Inpatient Hospital Stay (HOSPITAL_COMMUNITY): Payer: Medicaid Other | Attending: Hematology

## 2019-01-21 DIAGNOSIS — C49A3 Gastrointestinal stromal tumor of small intestine: Secondary | ICD-10-CM | POA: Insufficient documentation

## 2019-01-21 DIAGNOSIS — Z79899 Other long term (current) drug therapy: Secondary | ICD-10-CM | POA: Insufficient documentation

## 2019-01-21 DIAGNOSIS — G893 Neoplasm related pain (acute) (chronic): Secondary | ICD-10-CM | POA: Insufficient documentation

## 2019-01-21 LAB — CBC WITH DIFFERENTIAL/PLATELET
Abs Immature Granulocytes: 0.02 10*3/uL (ref 0.00–0.07)
Basophils Absolute: 0.1 10*3/uL (ref 0.0–0.1)
Basophils Relative: 1 %
Eosinophils Absolute: 0.1 10*3/uL (ref 0.0–0.5)
Eosinophils Relative: 1 %
HCT: 33.8 % — ABNORMAL LOW (ref 39.0–52.0)
Hemoglobin: 11.5 g/dL — ABNORMAL LOW (ref 13.0–17.0)
Immature Granulocytes: 0 %
Lymphocytes Relative: 29 %
Lymphs Abs: 2 10*3/uL (ref 0.7–4.0)
MCH: 36.7 pg — ABNORMAL HIGH (ref 26.0–34.0)
MCHC: 34 g/dL (ref 30.0–36.0)
MCV: 108 fL — ABNORMAL HIGH (ref 80.0–100.0)
Monocytes Absolute: 0.7 10*3/uL (ref 0.1–1.0)
Monocytes Relative: 9 %
Neutro Abs: 4.1 10*3/uL (ref 1.7–7.7)
Neutrophils Relative %: 60 %
Platelets: 58 10*3/uL — ABNORMAL LOW (ref 150–400)
RBC: 3.13 MIL/uL — ABNORMAL LOW (ref 4.22–5.81)
RDW: 13.7 % (ref 11.5–15.5)
WBC: 6.9 10*3/uL (ref 4.0–10.5)
nRBC: 0 % (ref 0.0–0.2)

## 2019-01-23 ENCOUNTER — Inpatient Hospital Stay (HOSPITAL_COMMUNITY): Payer: Medicaid Other

## 2019-01-23 ENCOUNTER — Ambulatory Visit (HOSPITAL_COMMUNITY): Admission: RE | Admit: 2019-01-23 | Payer: Medicaid Other | Source: Ambulatory Visit

## 2019-01-26 ENCOUNTER — Encounter (HOSPITAL_COMMUNITY): Payer: Medicaid Other

## 2019-01-26 ENCOUNTER — Other Ambulatory Visit (HOSPITAL_COMMUNITY): Payer: Self-pay | Admitting: *Deleted

## 2019-01-26 DIAGNOSIS — C49A3 Gastrointestinal stromal tumor of small intestine: Secondary | ICD-10-CM

## 2019-01-26 MED ORDER — IMATINIB MESYLATE 100 MG PO TABS: ORAL_TABLET | ORAL | 3 refills | Status: AC

## 2019-01-27 ENCOUNTER — Encounter (HOSPITAL_COMMUNITY): Payer: Medicaid Other

## 2019-01-27 ENCOUNTER — Ambulatory Visit (HOSPITAL_COMMUNITY): Payer: Medicaid Other | Admitting: Nurse Practitioner

## 2019-02-19 ENCOUNTER — Ambulatory Visit (HOSPITAL_COMMUNITY): Payer: Medicaid Other

## 2019-02-19 ENCOUNTER — Inpatient Hospital Stay (HOSPITAL_COMMUNITY): Payer: Medicaid Other | Attending: Hematology

## 2019-02-23 ENCOUNTER — Ambulatory Visit (HOSPITAL_COMMUNITY): Payer: Medicaid Other | Admitting: Nurse Practitioner

## 2019-02-23 ENCOUNTER — Encounter (HOSPITAL_COMMUNITY): Payer: Medicaid Other

## 2019-03-11 ENCOUNTER — Other Ambulatory Visit (HOSPITAL_COMMUNITY): Payer: Self-pay | Admitting: *Deleted

## 2019-03-11 DIAGNOSIS — C49A9 Gastrointestinal stromal tumor of other sites: Secondary | ICD-10-CM

## 2019-03-11 DIAGNOSIS — C49A3 Gastrointestinal stromal tumor of small intestine: Secondary | ICD-10-CM

## 2019-03-11 DIAGNOSIS — E538 Deficiency of other specified B group vitamins: Secondary | ICD-10-CM

## 2019-03-12 ENCOUNTER — Ambulatory Visit (HOSPITAL_COMMUNITY): Admission: RE | Admit: 2019-03-12 | Payer: Medicaid Other | Source: Ambulatory Visit

## 2019-03-12 ENCOUNTER — Inpatient Hospital Stay (HOSPITAL_COMMUNITY): Payer: Medicaid Other

## 2019-03-18 ENCOUNTER — Ambulatory Visit (HOSPITAL_COMMUNITY): Payer: Medicaid Other | Admitting: Nurse Practitioner

## 2019-03-18 ENCOUNTER — Encounter (HOSPITAL_COMMUNITY): Payer: Medicaid Other

## 2019-04-13 DEATH — deceased
